# Patient Record
Sex: Female | Born: 1960 | Race: Black or African American | Hispanic: No | Marital: Married | Smoking: Never smoker
Health system: Southern US, Community
[De-identification: ages and names within clinical notes are randomized; demographics above are authoritative.]

## PROBLEM LIST (undated history)

## (undated) DIAGNOSIS — I10 Essential (primary) hypertension: Secondary | ICD-10-CM

## (undated) DIAGNOSIS — J324 Chronic pansinusitis: Secondary | ICD-10-CM

## (undated) DIAGNOSIS — M810 Age-related osteoporosis without current pathological fracture: Secondary | ICD-10-CM

## (undated) DIAGNOSIS — G4700 Insomnia, unspecified: Secondary | ICD-10-CM

## (undated) DIAGNOSIS — R350 Frequency of micturition: Secondary | ICD-10-CM

## (undated) DIAGNOSIS — Z8711 Personal history of peptic ulcer disease: Secondary | ICD-10-CM

## (undated) DIAGNOSIS — F439 Reaction to severe stress, unspecified: Secondary | ICD-10-CM

## (undated) DIAGNOSIS — K219 Gastro-esophageal reflux disease without esophagitis: Secondary | ICD-10-CM

## (undated) DIAGNOSIS — R109 Unspecified abdominal pain: Secondary | ICD-10-CM

## (undated) DIAGNOSIS — F40240 Claustrophobia: Secondary | ICD-10-CM

## (undated) DIAGNOSIS — J302 Other seasonal allergic rhinitis: Secondary | ICD-10-CM

## (undated) DIAGNOSIS — R5383 Other fatigue: Secondary | ICD-10-CM

## (undated) DIAGNOSIS — R140 Abdominal distension (gaseous): Secondary | ICD-10-CM

## (undated) DIAGNOSIS — M1990 Unspecified osteoarthritis, unspecified site: Secondary | ICD-10-CM

## (undated) DIAGNOSIS — G8929 Other chronic pain: Secondary | ICD-10-CM

## (undated) DIAGNOSIS — R1011 Right upper quadrant pain: Secondary | ICD-10-CM

## (undated) DIAGNOSIS — R630 Anorexia: Secondary | ICD-10-CM

## (undated) HISTORY — DX: Other seasonal allergic rhinitis: J30.2

---

## 1999-07-18 ENCOUNTER — Other Ambulatory Visit: Admission: RE | Admit: 1999-07-18 | Discharge: 1999-07-18 | Payer: Self-pay | Admitting: Obstetrics and Gynecology

## 1999-11-05 ENCOUNTER — Other Ambulatory Visit: Admission: RE | Admit: 1999-11-05 | Discharge: 1999-11-05 | Payer: Self-pay | Admitting: Obstetrics and Gynecology

## 1999-12-03 ENCOUNTER — Other Ambulatory Visit: Admission: RE | Admit: 1999-12-03 | Discharge: 1999-12-03 | Payer: Self-pay | Admitting: Obstetrics and Gynecology

## 2000-01-21 ENCOUNTER — Encounter (INDEPENDENT_AMBULATORY_CARE_PROVIDER_SITE_OTHER): Payer: Self-pay | Admitting: Specialist

## 2000-01-21 ENCOUNTER — Other Ambulatory Visit: Admission: RE | Admit: 2000-01-21 | Discharge: 2000-01-21 | Payer: Self-pay | Admitting: Obstetrics and Gynecology

## 2000-07-30 ENCOUNTER — Other Ambulatory Visit: Admission: RE | Admit: 2000-07-30 | Discharge: 2000-07-30 | Payer: Self-pay | Admitting: Obstetrics and Gynecology

## 2000-08-27 ENCOUNTER — Other Ambulatory Visit: Admission: RE | Admit: 2000-08-27 | Discharge: 2000-08-27 | Payer: Self-pay | Admitting: Obstetrics and Gynecology

## 2000-12-22 ENCOUNTER — Inpatient Hospital Stay (HOSPITAL_COMMUNITY): Admission: AD | Admit: 2000-12-22 | Discharge: 2000-12-22 | Payer: Self-pay | Admitting: Obstetrics & Gynecology

## 2001-01-07 ENCOUNTER — Other Ambulatory Visit: Admission: RE | Admit: 2001-01-07 | Discharge: 2001-01-07 | Payer: Self-pay | Admitting: Obstetrics and Gynecology

## 2001-08-01 ENCOUNTER — Other Ambulatory Visit: Admission: RE | Admit: 2001-08-01 | Discharge: 2001-08-01 | Payer: Self-pay | Admitting: Obstetrics and Gynecology

## 2002-03-06 ENCOUNTER — Emergency Department (HOSPITAL_COMMUNITY): Admission: EM | Admit: 2002-03-06 | Discharge: 2002-03-06 | Payer: Self-pay | Admitting: Emergency Medicine

## 2002-10-10 ENCOUNTER — Other Ambulatory Visit: Admission: RE | Admit: 2002-10-10 | Discharge: 2002-10-10 | Payer: Self-pay | Admitting: Obstetrics and Gynecology

## 2003-11-02 ENCOUNTER — Other Ambulatory Visit: Admission: RE | Admit: 2003-11-02 | Discharge: 2003-11-02 | Payer: Self-pay | Admitting: Obstetrics and Gynecology

## 2004-12-05 ENCOUNTER — Other Ambulatory Visit: Admission: RE | Admit: 2004-12-05 | Discharge: 2004-12-05 | Payer: Self-pay | Admitting: Obstetrics and Gynecology

## 2005-06-29 HISTORY — PX: ENDOMETRIAL ABLATION: SHX621

## 2006-04-28 ENCOUNTER — Encounter: Admission: RE | Admit: 2006-04-28 | Discharge: 2006-04-28 | Payer: Self-pay | Admitting: Obstetrics and Gynecology

## 2006-05-18 ENCOUNTER — Encounter: Admission: RE | Admit: 2006-05-18 | Discharge: 2006-05-18 | Payer: Self-pay | Admitting: Obstetrics and Gynecology

## 2006-06-02 ENCOUNTER — Encounter: Admission: RE | Admit: 2006-06-02 | Discharge: 2006-06-02 | Payer: Self-pay | Admitting: Obstetrics and Gynecology

## 2006-08-31 HISTORY — PX: TUBAL LIGATION: SHX77

## 2006-12-13 ENCOUNTER — Ambulatory Visit (HOSPITAL_COMMUNITY): Admission: RE | Admit: 2006-12-13 | Discharge: 2006-12-13 | Payer: Self-pay | Admitting: Obstetrics and Gynecology

## 2009-10-02 ENCOUNTER — Emergency Department: Payer: Self-pay | Admitting: Emergency Medicine

## 2010-07-19 ENCOUNTER — Encounter: Payer: Self-pay | Admitting: Obstetrics and Gynecology

## 2010-11-18 ENCOUNTER — Other Ambulatory Visit (HOSPITAL_BASED_OUTPATIENT_CLINIC_OR_DEPARTMENT_OTHER): Payer: BC Managed Care – PPO

## 2010-11-18 ENCOUNTER — Encounter (HOSPITAL_BASED_OUTPATIENT_CLINIC_OR_DEPARTMENT_OTHER)
Admission: RE | Admit: 2010-11-18 | Discharge: 2010-11-18 | Disposition: A | Discharge status: Home / Self Care | Payer: BC Managed Care – PPO | Source: Ambulatory Visit | Attending: Orthopedic Surgery | Admitting: Orthopedic Surgery

## 2010-11-18 LAB — BASIC METABOLIC PANEL
BUN: 8 mg/dL (ref 6–23)
CO2: 28 mEq/L (ref 19–32)
Glucose, Bld: 94 mg/dL (ref 70–99)
Potassium: 3.1 mEq/L — ABNORMAL LOW (ref 3.5–5.1)
Sodium: 136 mEq/L (ref 135–145)

## 2010-11-20 ENCOUNTER — Ambulatory Visit (HOSPITAL_BASED_OUTPATIENT_CLINIC_OR_DEPARTMENT_OTHER)
Admission: RE | Admit: 2010-11-20 | Discharge: 2010-11-21 | Disposition: A | Discharge status: Home / Self Care | Payer: BC Managed Care – PPO | Source: Ambulatory Visit | Attending: Orthopedic Surgery | Admitting: Orthopedic Surgery

## 2010-11-20 DIAGNOSIS — M719 Bursopathy, unspecified: Secondary | ICD-10-CM | POA: Insufficient documentation

## 2010-11-20 DIAGNOSIS — M949 Disorder of cartilage, unspecified: Secondary | ICD-10-CM | POA: Insufficient documentation

## 2010-11-20 DIAGNOSIS — Z01812 Encounter for preprocedural laboratory examination: Secondary | ICD-10-CM | POA: Insufficient documentation

## 2010-11-20 DIAGNOSIS — M24119 Other articular cartilage disorders, unspecified shoulder: Secondary | ICD-10-CM | POA: Insufficient documentation

## 2010-11-20 DIAGNOSIS — M25819 Other specified joint disorders, unspecified shoulder: Secondary | ICD-10-CM | POA: Insufficient documentation

## 2010-11-20 DIAGNOSIS — M899 Disorder of bone, unspecified: Secondary | ICD-10-CM | POA: Insufficient documentation

## 2010-11-20 DIAGNOSIS — M66329 Spontaneous rupture of flexor tendons, unspecified upper arm: Secondary | ICD-10-CM | POA: Insufficient documentation

## 2010-11-20 DIAGNOSIS — M67919 Unspecified disorder of synovium and tendon, unspecified shoulder: Secondary | ICD-10-CM | POA: Insufficient documentation

## 2010-11-23 NOTE — Op Note (Signed)
  NAMEBRINLYNN, Chelsea Wheeler              ACCOUNT NO.:  [id number]  MEDICAL RECORD NO.:  [id number]          PATIENT TYPE:  LOCATION:                                 FACILITY:  PHYSICIAN:  Loreta Ave, M.D. DATE OF BIRTH:  02-22-1961  DATE OF PROCEDURE:  11/20/2010 DATE OF DISCHARGE:                              OPERATIVE REPORT   PREOPERATIVE DIAGNOSES:  Right shoulder rotator cuff tear and impingement, distal clavicle osteolysis.  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  Degenerative tearing of labrum.  Partial tearing of long head biceps tendon.  PROCEDURE:  Right shoulder exam under anesthesia, arthroscopy, debridement of labrum, biceps.  Bursectomy, acromioplasty, CA ligament release.  Excision distal clavicle.  Arthroscopic-assisted rotator cuff repair.  Fiber weave suture x2, swivel lock anchors x2.SURGEON:  Loreta Ave, MD  ASSISTANT:  Genene Churn. Barry Dienes, PA present throughout the entire case, necessary for timely completion of procedure.  ANESTHESIA:  General.  BLOOD LOSS:  Minimal.  SPECIMEN:  None.  CULTURES:  None.  COMPLICATIONS:  None.  DRESSINGS:  Soft compressive with shoulder immobilizer  PROCEDURE:  The patient was brought to the operating room and placed on the operating table in supine position.  After adequate anesthesia had been obtained, shoulders examined.  Full motion stable shoulder. Prepped and draped in usual sterile fashion.  Three portals anterior, posterior, and lateral.  Arthroscope was introduced.  Shoulder was distended and inspected.  Full-thickness tear supraspinatus tendon with marked intratendinous tearing.  Retracted a centimeter, but very mobile and reparable.  Biceps had some partial tearing as it exited, the shoulder debrided, not enough to warrant release.  Tearing of the labrum from back debrided.  Some focal  grade 3 changes in the glenoid, but rest of the glenohumeral joint looked reasonably good.  From above type 3 acromion, bursa resected.  Acromioplasty to a type 1 acromion releasing CA ligament.  The cannula placed laterally.  The cuff was mobilized, captured with two horizontal mattress sutures that were firmly anchored down into the roughened tuberosity with two swivel lock anchors. Yielding a nice firm watertight closure of the cuff.  Distal clavicle exposed.  Lateral centimeter of the clavicle resected.  Adequacy of decompression, lateral excision of the clavicle and cuff repair was confirmed viewing from all portals.  Instruments and fluid removed.  All portals closed with nylon.  Sterile compressive dressing applied. Shoulder immobilizer applied.  Anesthesia reversed.  Brought to the recovery room.  Tolerated surgery well.  No complications.     Loreta Ave, M.D.     DFM/MEDQ  D:  11/20/2010  T:  11/21/2010  Job:  [id number]  cc:   Dr. Dario Ave  Electronically Signed by Mckinley Jewel M.D. on 11/23/2010 11:38:16 AM

## 2010-12-03 ENCOUNTER — Ambulatory Visit
Payer: BC Managed Care – PPO | Attending: Orthopedic Surgery | Admitting: Rehabilitative and Restorative Service Providers"

## 2010-12-03 DIAGNOSIS — M6281 Muscle weakness (generalized): Secondary | ICD-10-CM | POA: Insufficient documentation

## 2010-12-03 DIAGNOSIS — M25619 Stiffness of unspecified shoulder, not elsewhere classified: Secondary | ICD-10-CM | POA: Insufficient documentation

## 2010-12-03 DIAGNOSIS — IMO0001 Reserved for inherently not codable concepts without codable children: Secondary | ICD-10-CM | POA: Insufficient documentation

## 2010-12-08 ENCOUNTER — Encounter: Payer: BC Managed Care – PPO | Admitting: Physical Therapy

## 2010-12-09 ENCOUNTER — Ambulatory Visit: Payer: BC Managed Care – PPO | Admitting: Rehabilitative and Restorative Service Providers"

## 2010-12-11 ENCOUNTER — Ambulatory Visit: Payer: BC Managed Care – PPO | Admitting: Physical Therapy

## 2010-12-16 ENCOUNTER — Ambulatory Visit: Payer: BC Managed Care – PPO | Admitting: Rehabilitative and Restorative Service Providers"

## 2010-12-18 ENCOUNTER — Ambulatory Visit: Payer: BC Managed Care – PPO | Admitting: Rehabilitative and Restorative Service Providers"

## 2010-12-23 ENCOUNTER — Ambulatory Visit: Payer: BC Managed Care – PPO | Admitting: Physical Therapy

## 2010-12-25 ENCOUNTER — Ambulatory Visit: Payer: BC Managed Care – PPO | Admitting: Physical Therapy

## 2010-12-30 ENCOUNTER — Ambulatory Visit
Payer: BC Managed Care – PPO | Attending: Orthopedic Surgery | Admitting: Rehabilitative and Restorative Service Providers"

## 2010-12-30 DIAGNOSIS — M6281 Muscle weakness (generalized): Secondary | ICD-10-CM | POA: Insufficient documentation

## 2010-12-30 DIAGNOSIS — M25619 Stiffness of unspecified shoulder, not elsewhere classified: Secondary | ICD-10-CM | POA: Insufficient documentation

## 2010-12-30 DIAGNOSIS — IMO0001 Reserved for inherently not codable concepts without codable children: Secondary | ICD-10-CM | POA: Insufficient documentation

## 2011-01-01 ENCOUNTER — Ambulatory Visit: Payer: BC Managed Care – PPO | Admitting: Rehabilitative and Restorative Service Providers"

## 2011-01-06 ENCOUNTER — Ambulatory Visit: Payer: BC Managed Care – PPO | Admitting: Physical Therapy

## 2011-01-08 ENCOUNTER — Ambulatory Visit: Payer: BC Managed Care – PPO | Admitting: Physical Therapy

## 2011-01-12 ENCOUNTER — Ambulatory Visit: Payer: BC Managed Care – PPO | Admitting: Physical Therapy

## 2011-01-14 ENCOUNTER — Ambulatory Visit: Payer: BC Managed Care – PPO | Admitting: Physical Therapy

## 2011-01-19 ENCOUNTER — Ambulatory Visit: Payer: BC Managed Care – PPO | Admitting: Rehabilitative and Restorative Service Providers"

## 2011-01-21 ENCOUNTER — Ambulatory Visit: Payer: BC Managed Care – PPO | Admitting: Rehabilitative and Restorative Service Providers"

## 2011-01-26 ENCOUNTER — Ambulatory Visit: Payer: BC Managed Care – PPO | Admitting: Rehabilitative and Restorative Service Providers"

## 2011-01-28 ENCOUNTER — Ambulatory Visit
Payer: BC Managed Care – PPO | Attending: Orthopedic Surgery | Admitting: Rehabilitative and Restorative Service Providers"

## 2011-01-28 DIAGNOSIS — M25619 Stiffness of unspecified shoulder, not elsewhere classified: Secondary | ICD-10-CM | POA: Insufficient documentation

## 2011-01-28 DIAGNOSIS — IMO0001 Reserved for inherently not codable concepts without codable children: Secondary | ICD-10-CM | POA: Insufficient documentation

## 2011-01-28 DIAGNOSIS — M6281 Muscle weakness (generalized): Secondary | ICD-10-CM | POA: Insufficient documentation

## 2011-02-02 ENCOUNTER — Ambulatory Visit: Payer: BC Managed Care – PPO | Admitting: Physical Therapy

## 2011-02-04 ENCOUNTER — Ambulatory Visit: Payer: BC Managed Care – PPO | Admitting: Physical Therapy

## 2011-02-10 ENCOUNTER — Ambulatory Visit: Payer: BC Managed Care – PPO | Admitting: Physical Therapy

## 2011-02-12 ENCOUNTER — Ambulatory Visit: Payer: BC Managed Care – PPO | Admitting: Physical Therapy

## 2011-02-16 ENCOUNTER — Encounter: Payer: BC Managed Care – PPO | Admitting: Rehabilitative and Restorative Service Providers"

## 2011-02-17 ENCOUNTER — Ambulatory Visit: Payer: BC Managed Care – PPO | Admitting: Rehabilitative and Restorative Service Providers"

## 2011-09-01 ENCOUNTER — Other Ambulatory Visit: Payer: Self-pay | Admitting: Gastroenterology

## 2011-09-11 ENCOUNTER — Ambulatory Visit
Admission: RE | Admit: 2011-09-11 | Discharge: 2011-09-11 | Disposition: A | Discharge status: Home / Self Care | Payer: BC Managed Care – PPO | Source: Ambulatory Visit | Attending: Gastroenterology | Admitting: Gastroenterology

## 2011-09-11 MED ORDER — IOHEXOL 300 MG/ML  SOLN
100.0000 mL | Freq: Once | INTRAMUSCULAR | Status: AC | PRN
  Administered 2011-09-11: 100 mL via INTRAVENOUS

## 2011-09-15 ENCOUNTER — Other Ambulatory Visit: Payer: Self-pay | Admitting: Gastroenterology

## 2011-09-15 DIAGNOSIS — K769 Liver disease, unspecified: Secondary | ICD-10-CM

## 2011-09-19 ENCOUNTER — Ambulatory Visit
Admission: RE | Admit: 2011-09-19 | Discharge: 2011-09-19 | Disposition: A | Discharge status: Home / Self Care | Payer: No Typology Code available for payment source | Source: Ambulatory Visit | Attending: Gastroenterology | Admitting: Gastroenterology

## 2011-09-19 DIAGNOSIS — K769 Liver disease, unspecified: Secondary | ICD-10-CM

## 2011-09-19 MED ORDER — GADOXETATE DISODIUM 0.25 MMOL/ML IV SOLN
8.0000 mL | Freq: Once | INTRAVENOUS | Status: AC | PRN
  Administered 2011-09-19: 8 mL via INTRAVENOUS

## 2012-09-14 ENCOUNTER — Ambulatory Visit (INDEPENDENT_AMBULATORY_CARE_PROVIDER_SITE_OTHER): Payer: BC Managed Care – PPO | Admitting: Internal Medicine

## 2012-09-14 ENCOUNTER — Ambulatory Visit (INDEPENDENT_AMBULATORY_CARE_PROVIDER_SITE_OTHER)
Admission: RE | Admit: 2012-09-14 | Discharge: 2012-09-14 | Disposition: A | Discharge status: Home / Self Care | Payer: BC Managed Care – PPO | Source: Ambulatory Visit | Attending: Internal Medicine | Admitting: Internal Medicine

## 2012-09-14 ENCOUNTER — Encounter: Payer: Self-pay | Admitting: Internal Medicine

## 2012-09-14 VITALS — BP 110/82 | HR 70 | Ht 68.0 in | Wt 194.0 lb

## 2012-09-14 DIAGNOSIS — R059 Cough, unspecified: Secondary | ICD-10-CM

## 2012-09-14 DIAGNOSIS — R05 Cough: Secondary | ICD-10-CM

## 2012-09-14 MED ORDER — PREDNISONE (PAK) 10 MG PO TABS: ORAL_TABLET | ORAL | Status: DC

## 2012-09-14 MED ORDER — TRAMADOL HCL 50 MG PO TABS: ORAL_TABLET | ORAL | Status: DC

## 2012-09-14 MED ORDER — FAMOTIDINE 20 MG PO TABS: ORAL_TABLET | ORAL | Status: DC

## 2012-09-14 NOTE — Patient Instructions (Signed)
The key to effective treatment for your cough is eliminating the non-stop cycle of cough you're stuck in long enough to let your airway heal completely and then see if there is anything still making you cough once you stop the cough suppression, but this should take no more than 5 days to figuure out  First take delsym two tsp every 12 hours and supplement if needed with  tramadol 50 mg up to 2 every 4 hours to suppress the urge to cough. Swallowing water or using ice chips/non mint and menthol containing candies (such as lifesavers or sugarless jolly ranchers) are also effective.  You should rest your voice and avoid activities that you know make you cough.  Once you have eliminated the cough for 3 straight days try reducing the tramadol first,  then the delsym as tolerated.    Try nexium Take 30-60 min before first meal of the day and Pepcid 20 mg one bedtime until cough is completely gone for at least a week without the need for cough suppression  I think of reflux for chronic cough like I do oxygen for fire (doesn't cause the fire but once you get the oxygen suppressed it usually goes away regardless of the exact cause).  GERD (REFLUX)  is an extremely common cause of respiratory symptoms, many times with no significant heartburn at all.    It can be treated with medication, but also with lifestyle changes including avoidance of late meals, excessive alcohol, smoking cessation, and avoid fatty foods, chocolate, peppermint, colas, red wine, and acidic juices such as orange juice.  NO MINT OR MENTHOL PRODUCTS SO NO COUGH DROPS  USE SUGARLESS CANDY INSTEAD (jolley ranchers or Stover's)  NO OIL BASED VITAMINS - use powdered substitutes.    Prednisone 10 mg take  4 each am x 2 days,   2 each am x 2 days,  1 each am x2days and stop

## 2012-09-14 NOTE — Progress Notes (Signed)
  Subjective:    Patient ID: Chelsea Wheeler, female    DOB: [birth year], 52 y.o.   MRN: [id number]  HPI  61 yobf never smoker with onset ? Allergies in her 30's seasonal rhininitis rx with nasonex and zyrtec intermittently but never cough until late 2013 self referred 09/14/2012 to pulmonary clinic for cough   09/14/2012 1st pulmonary ov/ cc new pattern of acute coughing starting mid dec 2013  assoc with sneeze, itching, nasal congestion treated with abx, zpak, symbicort and proaire, tessilon pearls and prednisone and nexium 40 mg prn > never better, even transiently  Cough occurs day and night but  worse 2 am wakes up with it. Coughs so hard gags/vomits and also cough with eating, mucus is clear.  No obvious daytime variabilty or assoc chronic cough or cp or chest tightness, subjective wheeze overt sinus or hb symptoms. No unusual exp hx    Also denies any obvious fluctuation of symptoms with weather or environmental changes or other aggravating or alleviating factors except as outlined above     Review of Systems  Constitutional: Negative for fever, chills and unexpected weight change.  HENT: Positive for sneezing. Negative for ear pain, nosebleeds, congestion, sore throat, rhinorrhea, trouble swallowing, dental problem, voice change, postnasal drip and sinus pressure.   Eyes: Negative for visual disturbance.  Respiratory: Positive for cough and choking. Negative for shortness of breath.   Cardiovascular: Negative for chest pain and leg swelling.  Gastrointestinal: Negative for vomiting, abdominal pain and diarrhea.  Genitourinary: Negative for difficulty urinating.  Musculoskeletal: Negative for arthralgias.  Skin: Negative for rash.  Neurological: Positive for headaches. Negative for tremors and syncope.  Hematological: Does not bruise/bleed easily.       Objective:   Physical Exam  amb bf nad HEENT: nl dentition, turbinates, and orophanx. Nl external ear canals without cough  reflex   NECK :  without JVD/Nodes/TM/ nl carotid upstrokes bilaterally   LUNGS: no acc muscle use, clear to A and P bilaterally without cough on insp or exp maneuvers   CV:  RRR  no s3 or murmur or increase in P2, no edema   ABD:  soft and nontender with nl excursion in the supine position. No bruits or organomegaly, bowel sounds nl  MS:  warm without deformities, calf tenderness, cyanosis or clubbing  SKIN: warm and dry without lesions    NEURO:  alert, approp, no deficits    CXR  09/14/2012 :  Minimal peribronchial thickening of uncertain chronicity.          Assessment & Plan:

## 2012-09-14 NOTE — Assessment & Plan Note (Signed)
The most common causes of chronic cough in immunocompetent adults include the following: upper airway cough syndrome (UACS), previously referred to as postnasal drip syndrome (PNDS), which is caused by variety of rhinosinus conditions; (2) asthma; (3) GERD; (4) chronic bronchitis from cigarette smoking or other inhaled environmental irritants; (5) nonasthmatic eosinophilic bronchitis; and (6) bronchiectasis.   These conditions, singly or in combination, have accounted for up to 94% of the causes of chronic cough in prospective studies.   Other conditions have constituted no >6% of the causes in prospective studies These have included bronchogenic carcinoma, chronic interstitial pneumonia, sarcoidosis, left ventricular failure, ACEI-induced cough, and aspiration from a condition associated with pharyngeal dysfunction.   .Chronic cough is often simultaneously caused by more than one condition. A single cause has been found from 38 to 82% of the time, multiple causes from 18 to 62%. Multiply caused cough has been the result of three diseases up to 42% of the time.    Of the three most common causes of chronic cough, only one (GERD)  can actually cause the other two (asthma and post nasal drip syndrome)  and perpetuate the cylce of cough inducing airway trauma, inflammation, heightened sensitivity to reflux which is prompted by the cough itself via a cyclical mechanism.    This may partially respond to steroids and look like asthma and post nasal drainage but never erradicated completely unless the cough and the secondary reflux are eliminated, preferably both at the same time.  While not intuitively obvious, many patients with chronic low grade reflux do not cough until there is a secondary insult that disturbs the protective epithelial barrier and exposes sensitive nerve endings.  This can be viral or direct physical injury such as with an endotracheal tube.   The point is that once this occurs, it is  difficult to eliminate using anything but a maximally effective acid suppression regimen at least in the short run, accompanied by an appropriate diet to address non acid GERD.    See instructions for specific recommendations which were reviewed directly with the patient who was given a copy with highlighter outlining the key components.  

## 2012-09-15 ENCOUNTER — Encounter: Payer: Self-pay | Admitting: Internal Medicine

## 2012-09-15 NOTE — Progress Notes (Signed)
Quick Note:  Spoke with pt and notified of results per Dr. Wert. Pt verbalized understanding and denied any questions.  ______ 

## 2012-09-26 ENCOUNTER — Telehealth: Payer: Self-pay | Admitting: Internal Medicine

## 2012-09-26 MED ORDER — TRAMADOL HCL 50 MG PO TABS: ORAL_TABLET | ORAL | Status: DC

## 2012-09-26 NOTE — Telephone Encounter (Signed)
Pt is aware RX was sent. Nothing further was needed

## 2012-09-26 NOTE — Telephone Encounter (Signed)
Ok x one then ov 

## 2012-09-26 NOTE — Telephone Encounter (Signed)
Last OV 09/14/12, rx for tramadol was sent for #40 on that date. LMTCB to review how pt cough is doing. Carron Curie, CMA

## 2012-09-26 NOTE — Telephone Encounter (Signed)
Pt states that cough is some what better comes an goes,not sure what triggers cough.  Pt is also using delsym which seems to help along with tramadol. Requesting another  Round of the tramadol to see if it will help her kick the cough.  Allergies  Allergen Reactions  . Lodine (Etodolac) Rash  . Naproxen Rash  . Penicillins Rash   Dr Sherene Sires Please advise  Thank you

## 2012-09-30 ENCOUNTER — Ambulatory Visit: Payer: BC Managed Care – PPO | Admitting: Internal Medicine

## 2012-10-21 ENCOUNTER — Ambulatory Visit: Payer: BC Managed Care – PPO | Admitting: Internal Medicine

## 2013-01-09 ENCOUNTER — Emergency Department: Payer: Self-pay | Admitting: Emergency Medicine

## 2013-01-13 ENCOUNTER — Other Ambulatory Visit: Payer: Self-pay | Admitting: Unknown Physician Specialty

## 2014-02-15 ENCOUNTER — Emergency Department (HOSPITAL_COMMUNITY)
Admission: EM | Admit: 2014-02-15 | Discharge: 2014-02-15 | Disposition: A | Discharge status: Home / Self Care | Payer: BC Managed Care – PPO | Attending: Emergency Medicine | Admitting: Emergency Medicine

## 2014-02-15 ENCOUNTER — Emergency Department (HOSPITAL_COMMUNITY): Payer: BC Managed Care – PPO

## 2014-02-15 ENCOUNTER — Encounter (HOSPITAL_COMMUNITY): Payer: Self-pay | Admitting: Emergency Medicine

## 2014-02-15 DIAGNOSIS — R079 Chest pain, unspecified: Secondary | ICD-10-CM | POA: Insufficient documentation

## 2014-02-15 DIAGNOSIS — M542 Cervicalgia: Secondary | ICD-10-CM | POA: Insufficient documentation

## 2014-02-15 DIAGNOSIS — I10 Essential (primary) hypertension: Secondary | ICD-10-CM | POA: Insufficient documentation

## 2014-02-15 DIAGNOSIS — Z79899 Other long term (current) drug therapy: Secondary | ICD-10-CM | POA: Insufficient documentation

## 2014-02-15 DIAGNOSIS — Z880 Allergy status to penicillin: Secondary | ICD-10-CM | POA: Diagnosis not present

## 2014-02-15 DIAGNOSIS — Z8709 Personal history of other diseases of the respiratory system: Secondary | ICD-10-CM | POA: Insufficient documentation

## 2014-02-15 HISTORY — DX: Essential (primary) hypertension: I10

## 2014-02-15 LAB — COMPREHENSIVE METABOLIC PANEL
ALT: 11 U/L (ref 0–35)
ANION GAP: 12 (ref 5–15)
AST: 18 U/L (ref 0–37)
Albumin: 4 g/dL (ref 3.5–5.2)
Alkaline Phosphatase: 64 U/L (ref 39–117)
BUN: 8 mg/dL (ref 6–23)
CO2: 28 meq/L (ref 19–32)
CREATININE: 0.88 mg/dL (ref 0.50–1.10)
Calcium: 9.9 mg/dL (ref 8.4–10.5)
Chloride: 100 mEq/L (ref 96–112)
GFR, EST AFRICAN AMERICAN: 87 mL/min — AB (ref >90)
GFR, EST NON AFRICAN AMERICAN: 75 mL/min — AB (ref >90)
GLUCOSE: 102 mg/dL — AB (ref 70–99)
Potassium: 3.6 mEq/L — ABNORMAL LOW (ref 3.7–5.3)
Sodium: 140 mEq/L (ref 137–147)
TOTAL PROTEIN: 7.9 g/dL (ref 6.0–8.3)
Total Bilirubin: 0.5 mg/dL (ref 0.3–1.2)

## 2014-02-15 LAB — PRO B NATRIURETIC PEPTIDE: PRO B NATRI PEPTIDE: 12.1 pg/mL (ref 0–125)

## 2014-02-15 LAB — TROPONIN I

## 2014-02-15 LAB — MAGNESIUM: MAGNESIUM: 2.1 mg/dL (ref 1.5–2.5)

## 2014-02-15 LAB — CBC
HCT: 39.4 % (ref 36.0–46.0)
HEMOGLOBIN: 13.3 g/dL (ref 12.0–15.0)
MCH: 29.6 pg (ref 26.0–34.0)
MCHC: 33.8 g/dL (ref 30.0–36.0)
MCV: 87.6 fL (ref 78.0–100.0)
Platelets: 159 10*3/uL (ref 150–400)
RBC: 4.5 MIL/uL (ref 3.87–5.11)
RDW: 12.5 % (ref 11.5–15.5)
WBC: 6.9 10*3/uL (ref 4.0–10.5)

## 2014-02-15 MED ORDER — DIAZEPAM 5 MG PO TABS: 5.0000 mg | ORAL_TABLET | Freq: Two times a day (BID) | ORAL | Status: DC

## 2014-02-15 MED ORDER — DIAZEPAM 5 MG PO TABS
5.0000 mg | ORAL_TABLET | Freq: Once | ORAL | Status: AC
  Administered 2014-02-15: 5 mg via ORAL
  Filled 2014-02-15: qty 1

## 2014-02-15 MED ORDER — HYDROCODONE-ACETAMINOPHEN 5-325 MG PO TABS
1.0000 | ORAL_TABLET | Freq: Once | ORAL | Status: AC
  Administered 2014-02-15: 1 via ORAL
  Filled 2014-02-15: qty 1

## 2014-02-15 MED ORDER — HYDROCODONE-ACETAMINOPHEN 5-325 MG PO TABS: 1.0000 | ORAL_TABLET | Freq: Four times a day (QID) | ORAL | Status: DC | PRN

## 2014-02-15 NOTE — Discharge Instructions (Signed)
As discussed, your evaluation today has been largely reassuring.  But, it is important that you monitor your condition carefully, and do not hesitate to return to the ED if you develop new, or concerning changes in your condition.  Otherwise, please follow-up with your physician for appropriate ongoing care.  Your pain is likely to 2 inflammation of the neck and the adjacent nerves. In addition to that they provided medication, please use ibuprofen, 600 mg, 3 times daily for 3 days. Please also use ice packs, 4 times daily for the next 3 days.  \

## 2014-02-15 NOTE — ED Provider Notes (Signed)
CSN: [id number]     Arrival date & time 02/15/14  1057 History   First MD Initiated Contact with Patient 02/15/14 1122     Chief Complaint  Patient presents with  . Shoulder Pain  . Numbness  . Chest Pain     (Consider location/radiation/quality/duration/timing/severity/associated sxs/prior Treatment) HPI Patient presents with bilateral posterior shoulder and neck pain.  Symptoms improved in approximately one week ago. Patient is doing substantial amounts of home and cleaning activity and chores during the onset of symptoms. Since onset symptoms have been waxing, waning, but I was present and some degree. Over the past day the pain has become more severe. Patient has not taken any medication for pain relief in approximately one week. Currently the pain also radiates towards the left arm.  On review of systems questioning the patient also notes mild occasional chest pressure. She currently denies dyspnea, lightheadedness, syncope, nausea, vomiting fever, chills.  Past Medical History  Diagnosis Date  . Seasonal allergies   . Hypertension    Past Surgical History  Procedure Laterality Date  . Tubal ligation  08-31-06   Family History  Problem Relation Age of Onset  . Allergies Sister    History  Substance Use Topics  . Smoking status: Never Smoker   . Smokeless tobacco: Never Used  . Alcohol Use: No   OB History   Grav Para Term Preterm Abortions TAB SAB Ect Mult Living                 Review of Systems  Constitutional:       Per HPI, otherwise negative  HENT:       Per HPI, otherwise negative  Respiratory:       Per HPI, otherwise negative  Cardiovascular:       Per HPI, otherwise negative  Gastrointestinal: Negative for vomiting.  Endocrine:       Negative aside from HPI  Genitourinary:       Neg aside from HPI   Musculoskeletal:       Per HPI, otherwise negative  Skin: Negative.   Neurological: Negative for syncope.      Allergies  Lodine; Naproxen;  and Penicillins  Home Medications   Prior to Admission medications   Medication Sig Start Date End Date Taking? Authorizing Provider  cetirizine (ZYRTEC) 10 MG tablet Take 10 mg by mouth daily as needed for allergies.   Yes Historical Provider, MD  esomeprazole (NEXIUM) 40 MG capsule Take 40 mg by mouth every other day.   Yes Historical Provider, MD  hydrochlorothiazide (HYDRODIURIL) 25 MG tablet Take 25 mg by mouth every evening.   Yes Historical Provider, MD  Multiple Vitamins-Minerals (ANTIOXIDANT FORMULA SG) capsule Take 1 capsule by mouth daily.   Yes Historical Provider, MD  PRESCRIPTION MEDICATION once a week. ALLERGY SHOT   Yes Historical Provider, MD  zolpidem (AMBIEN) 10 MG tablet Take 10 mg by mouth at bedtime as needed for sleep.   Yes Historical Provider, MD   BP 126/81  Pulse 66  Temp(Src) 99.3 F (37.4 C) (Oral)  Resp 16  SpO2 99% Physical Exam  Nursing note and vitals reviewed. Constitutional: She is oriented to person, place, and time. She appears well-developed and well-nourished. No distress.  HENT:  Head: Normocephalic and atraumatic.  Eyes: Conjunctivae and EOM are normal.  Neck:  Neck is supple with appropriate range of motion, though there is tenderness to palpation about the central posterior area, positive Spurling test  Cardiovascular: Normal rate and  regular rhythm.   Pulmonary/Chest: Effort normal and breath sounds normal. No stridor. No respiratory distress.  Abdominal: She exhibits no distension.  Musculoskeletal: She exhibits no edema.  Neurological: She is alert and oriented to person, place, and time. She displays no atrophy and no tremor. No cranial nerve deficit or sensory deficit. She exhibits normal muscle tone. She displays no seizure activity. Coordination normal.  Skin: Skin is warm and dry.  Psychiatric: She has a normal mood and affect.    ED Course  Procedures (including critical care time) Labs Review Labs Reviewed  COMPREHENSIVE  METABOLIC PANEL - Abnormal; Notable for the following:    Potassium 3.6 (*)    Glucose, Bld 102 (*)    GFR calc non Af Amer 75 (*)    GFR calc Af Amer 87 (*)    All other components within normal limits  CBC  PRO B NATRIURETIC PEPTIDE  MAGNESIUM  TROPONIN I    Imaging Review Dg Chest 2 View  02/15/2014   CLINICAL DATA:  Chest pain.  EXAM: CHEST  2 VIEW  COMPARISON:  September 14, 2012.  FINDINGS: The heart size and mediastinal contours are within normal limits. Both lungs are clear. No pneumothorax or pleural effusion is noted. The visualized skeletal structures are unremarkable.  IMPRESSION: No acute cardiopulmonary abnormality seen.   Electronically Signed   By: Roque LiasJames  Green M.D.   On: 02/15/2014 12:17     EKG Interpretation   Date/Time:  Thursday February 15 2014 11:41:15 EDT Ventricular Rate:  63 PR Interval:  158 QRS Duration: 85 QT Interval:  409 QTC Calculation: 419 R Axis:   65 Text Interpretation:  Sinus rhythm Sinus rhythm Artifact Abnormal ekg  Confirmed by Gerhard MunchLOCKWOOD, Briggitte Boline  MD ([id number]) on 02/15/2014 11:55:45 AM     2:30 PM On exam the patient is in no distress, states that she feels better.   MDM   Patient presents with new posterior superior pain, as well as possible radiation to his left arm. Patient has no findings consistent with coronary ischemia, nor with pulmonary infection Patient is distally neurovascularly intact, and with her improvement following analgesia, cryotherapy to the back, there suspicion for radicular pain. Patient was started on a course analgesics, discharged in stable condition to follow up with primary care and/or orthopedics as needed.     Gerhard Munchobert Mystique Bjelland, MD 02/15/14 1431

## 2014-02-15 NOTE — ED Notes (Addendum)
Pt c/o bilateral shoulder stiffness/tightness w/ numbness and tingling in bilateral arms starting last night and "the feeling something is stuck in her throat" and intermittent chest discomfort/pressure starting this morning.  Pain score 9/10.  Pt reports that numbness and tingling have "almost resolved."  Hx of HTN.

## 2015-03-18 ENCOUNTER — Encounter: Payer: Self-pay | Admitting: Anesthesiology

## 2015-03-18 ENCOUNTER — Encounter: Payer: Self-pay | Admitting: *Deleted

## 2015-03-20 ENCOUNTER — Encounter: Payer: Self-pay | Admitting: *Deleted

## 2015-03-20 ENCOUNTER — Encounter
Admission: RE | Admit: 2015-03-20 | Discharge: 2015-03-20 | Disposition: A | Discharge status: Home / Self Care | Payer: BC Managed Care – PPO | Source: Ambulatory Visit | Attending: Unknown Physician Specialty | Admitting: Unknown Physician Specialty

## 2015-03-20 DIAGNOSIS — K219 Gastro-esophageal reflux disease without esophagitis: Secondary | ICD-10-CM | POA: Diagnosis not present

## 2015-03-20 DIAGNOSIS — M13869 Other specified arthritis, unspecified knee: Secondary | ICD-10-CM | POA: Diagnosis not present

## 2015-03-20 DIAGNOSIS — M23200 Derangement of unspecified lateral meniscus due to old tear or injury, right knee: Secondary | ICD-10-CM | POA: Diagnosis present

## 2015-03-20 DIAGNOSIS — I10 Essential (primary) hypertension: Secondary | ICD-10-CM | POA: Diagnosis not present

## 2015-03-20 DIAGNOSIS — M23251 Derangement of posterior horn of lateral meniscus due to old tear or injury, right knee: Secondary | ICD-10-CM | POA: Diagnosis not present

## 2015-03-20 DIAGNOSIS — J45909 Unspecified asthma, uncomplicated: Secondary | ICD-10-CM | POA: Diagnosis not present

## 2015-03-20 DIAGNOSIS — F40240 Claustrophobia: Secondary | ICD-10-CM | POA: Diagnosis not present

## 2015-03-20 LAB — POTASSIUM: Potassium: 3.2 mmol/L — ABNORMAL LOW (ref 3.5–5.1)

## 2015-03-20 NOTE — Patient Instructions (Signed)
  Your procedure is scheduled on: 03-22-15 Report to [facility] SAME [facility] 2ND FLOOR To find out your arrival time please call [phone] between 1PM - 3PM on 03-21-15 (THURSDAY)  Remember: Instructions that are not followed completely may result in serious medical risk, up to and including death, or upon the discretion of your surgeon and anesthesiologist your surgery may need to be rescheduled.    _X___ 1. Do not eat food or drink liquids after midnight. No gum chewing or hard candies.     _X___ 2. No Alcohol for 24 hours before or after surgery.   ____ 3. Bring all medications with you on the day of surgery if instructed.    _X___ 4. Notify your doctor if there is any change in your medical condition     (cold, fever, infections).     Do not wear jewelry, make-up, hairpins, clips or nail polish.  Do not wear lotions, powders, or perfumes. You may wear deodorant.  Do not shave 48 hours prior to surgery. Men may shave face and neck.  Do not bring valuables to the hospital.    [facility] is not responsible for any belongings or valuables.               Contacts, dentures or bridgework may not be worn into surgery.  Leave your suitcase in the car. After surgery it may be brought to your room.  For patients admitted to the hospital, discharge time is determined by your  treatment team.   Patients discharged the day of surgery will not be allowed to drive home.   Please read over the following fact sheets that you were given:      _X___ Take these medicines the morning of surgery with A SIP OF WATER:    1. NEXIUM  2. TAKE AN EXTRA NEXIUM Thursday NIGHT  3.   4.  5.  6.  ____ Fleet Enema (as directed)   __X__ Use CHG Soap as directed  ____ Use inhalers on the day of surgery  ____ Stop metformin 2 days prior to surgery    ____ Take 1/2 of usual insulin dose the night before surgery and none on the morning of surgery.   ____ Stop  Coumadin/Plavix/aspirin-N/A  __X__ Stop Anti-inflammatories-STOP MELOXICAM NOW-NO NSAIDS OR ASPIRIN PRODUCTS-TYLENOL/TRAMADOL OK   ____ Stop supplements until after surgery.    ____ Bring C-Pap to the hospital.

## 2015-03-21 NOTE — Pre-Procedure Instructions (Signed)
CALLED DR Lewis Moccasin OFFICE AND SPOKE WITH RECEPTIONIST ABOUT LOW POTASSIUM AND THAT PT HAS SURGERY TOMORROW.  I ALSO FAXED POTASSIUM RESULT TO DR Lewis Moccasin OFFICE AND RECEIVED A FAX CONFIRMATION THAT IT WENT THRU. RECEPTIONIST SAID SHE WOULD GIVE RESULT TO DR Gavin Potters

## 2015-03-22 ENCOUNTER — Encounter: Payer: Self-pay | Admitting: *Deleted

## 2015-03-22 ENCOUNTER — Encounter
Admission: RE | Disposition: A | Discharge status: Home / Self Care | Payer: Self-pay | Source: Ambulatory Visit | Attending: Unknown Physician Specialty

## 2015-03-22 ENCOUNTER — Ambulatory Visit
Admission: RE | Admit: 2015-03-22 | Discharge: 2015-03-22 | Disposition: A | Discharge status: Home / Self Care | Payer: BC Managed Care – PPO | Source: Ambulatory Visit | Attending: Unknown Physician Specialty | Admitting: Unknown Physician Specialty

## 2015-03-22 ENCOUNTER — Encounter: Payer: Self-pay | Admitting: Anesthesiology

## 2015-03-22 ENCOUNTER — Encounter: Payer: BC Managed Care – PPO | Admitting: Anesthesiology

## 2015-03-22 DIAGNOSIS — M13869 Other specified arthritis, unspecified knee: Secondary | ICD-10-CM | POA: Insufficient documentation

## 2015-03-22 DIAGNOSIS — I10 Essential (primary) hypertension: Secondary | ICD-10-CM | POA: Insufficient documentation

## 2015-03-22 DIAGNOSIS — K219 Gastro-esophageal reflux disease without esophagitis: Secondary | ICD-10-CM | POA: Insufficient documentation

## 2015-03-22 DIAGNOSIS — M23251 Derangement of posterior horn of lateral meniscus due to old tear or injury, right knee: Secondary | ICD-10-CM | POA: Diagnosis not present

## 2015-03-22 DIAGNOSIS — J45909 Unspecified asthma, uncomplicated: Secondary | ICD-10-CM | POA: Insufficient documentation

## 2015-03-22 DIAGNOSIS — F40240 Claustrophobia: Secondary | ICD-10-CM | POA: Insufficient documentation

## 2015-03-22 HISTORY — PX: KNEE ARTHROSCOPY: SHX127

## 2015-03-22 HISTORY — DX: Unspecified osteoarthritis, unspecified site: M19.90

## 2015-03-22 HISTORY — DX: Claustrophobia: F40.240

## 2015-03-22 LAB — POCT I-STAT 4, (NA,K, GLUC, HGB,HCT)
GLUCOSE: 96 mg/dL (ref 65–99)
HCT: 38 % (ref 36.0–46.0)
Hemoglobin: 12.9 g/dL (ref 12.0–15.0)
POTASSIUM: 3.6 mmol/L (ref 3.5–5.1)
SODIUM: 141 mmol/L (ref 135–145)

## 2015-03-22 SURGERY — ARTHROSCOPY, KNEE
Anesthesia: General | Laterality: Right | Wound class: Clean

## 2015-03-22 SURGERY — ARTHROSCOPY, KNEE
Anesthesia: Choice | Laterality: Right

## 2015-03-22 MED ORDER — FENTANYL CITRATE (PF) 100 MCG/2ML IJ SOLN
INTRAMUSCULAR | Status: AC
  Filled 2015-03-22: qty 2

## 2015-03-22 MED ORDER — KETOROLAC TROMETHAMINE 30 MG/ML IJ SOLN
INTRAMUSCULAR | Status: DC | PRN
  Administered 2015-03-22: 30 mg via INTRAVENOUS

## 2015-03-22 MED ORDER — HYDROCODONE-ACETAMINOPHEN 5-325 MG PO TABS
1.0000 | ORAL_TABLET | Freq: Four times a day (QID) | ORAL | Status: DC | PRN
  Administered 2015-03-22: 1 via ORAL

## 2015-03-22 MED ORDER — PROMETHAZINE HCL 25 MG/ML IJ SOLN: 6.2500 mg | INTRAMUSCULAR | Status: DC | PRN

## 2015-03-22 MED ORDER — LACTATED RINGERS IV SOLN
INTRAVENOUS | Status: DC
  Administered 2015-03-22 (×2): via INTRAVENOUS

## 2015-03-22 MED ORDER — FENTANYL CITRATE (PF) 100 MCG/2ML IJ SOLN
INTRAMUSCULAR | Status: DC
Start: 2015-03-22 — End: 2015-03-22
  Filled 2015-03-22: qty 2

## 2015-03-22 MED ORDER — ONDANSETRON HCL 4 MG/2ML IJ SOLN
INTRAMUSCULAR | Status: DC | PRN
  Administered 2015-03-22: 4 mg via INTRAVENOUS

## 2015-03-22 MED ORDER — LIDOCAINE HCL (CARDIAC) 20 MG/ML IV SOLN
INTRAVENOUS | Status: DC | PRN
  Administered 2015-03-22: 30 mg via INTRAVENOUS

## 2015-03-22 MED ORDER — FENTANYL CITRATE (PF) 100 MCG/2ML IJ SOLN
25.0000 ug | INTRAMUSCULAR | Status: DC | PRN
  Administered 2015-03-22: 25 ug via INTRAVENOUS
  Administered 2015-03-22: 50 ug via INTRAVENOUS
  Administered 2015-03-22 (×3): 25 ug via INTRAVENOUS

## 2015-03-22 MED ORDER — HYDROCODONE-ACETAMINOPHEN 5-325 MG PO TABS
ORAL_TABLET | ORAL | Status: AC
  Filled 2015-03-22: qty 1

## 2015-03-22 MED ORDER — MIDAZOLAM HCL 2 MG/2ML IJ SOLN
INTRAMUSCULAR | Status: DC | PRN
  Administered 2015-03-22: 2 mg via INTRAVENOUS

## 2015-03-22 MED ORDER — PROPOFOL 10 MG/ML IV BOLUS
INTRAVENOUS | Status: DC | PRN
  Administered 2015-03-22: 200 mg via INTRAVENOUS

## 2015-03-22 MED ORDER — FENTANYL CITRATE (PF) 100 MCG/2ML IJ SOLN
INTRAMUSCULAR | Status: DC | PRN
  Administered 2015-03-22: 100 ug via INTRAVENOUS

## 2015-03-22 MED ORDER — DEXAMETHASONE SODIUM PHOSPHATE 4 MG/ML IJ SOLN
INTRAMUSCULAR | Status: DC | PRN
  Administered 2015-03-22: 4 mg via INTRAVENOUS

## 2015-03-22 MED ORDER — HYDROMORPHONE HCL 1 MG/ML IJ SOLN: 0.2500 mg | INTRAMUSCULAR | Status: DC | PRN

## 2015-03-22 MED ORDER — BUPIVACAINE HCL (PF) 0.5 % IJ SOLN
INTRAMUSCULAR | Status: DC | PRN
  Administered 2015-03-22: 10 mL

## 2015-03-22 MED ORDER — NORCO 5-325 MG PO TABS: 1.0000 | ORAL_TABLET | Freq: Four times a day (QID) | ORAL | Status: DC | PRN

## 2015-03-22 MED ORDER — BUPIVACAINE HCL (PF) 0.5 % IJ SOLN
INTRAMUSCULAR | Status: AC
  Filled 2015-03-22: qty 30

## 2015-03-22 SURGICAL SUPPLY — 38 items
ARTHROWAND PARAGON T2 (SURGICAL WAND)
BLADE ABRADER 4.5 (BLADE) ×3 IMPLANT
BLADE FULL RADIUS 3.5 (BLADE) ×2 IMPLANT
BLADE SHAVER 4.5X7 STR FR (MISCELLANEOUS) ×1 IMPLANT
BNDG ESMARK 6X12 TAN STRL LF (GAUZE/BANDAGES/DRESSINGS) ×3 IMPLANT
BUR ABRADER 4.0 W/FLUTE AQUA (MISCELLANEOUS) ×1 IMPLANT
BURR ABRADER 4.0 W/FLUTE AQUA (MISCELLANEOUS)
CHLORAPREP W/TINT 26ML (MISCELLANEOUS) ×3 IMPLANT
DRAPE LEGGINS SURG 28X43 STRL (DRAPES) ×3 IMPLANT
GAUZE SPONGE 4X4 12PLY STRL (GAUZE/BANDAGES/DRESSINGS) ×3 IMPLANT
GLOVE BIO SURGEON STRL SZ7.5 (GLOVE) ×3 IMPLANT
GLOVE BIO SURGEON STRL SZ8 (GLOVE) ×3 IMPLANT
GLOVE INDICATOR 8.0 STRL GRN (GLOVE) ×3 IMPLANT
GOWN STRL REUS W/ TWL LRG LVL3 (GOWN DISPOSABLE) ×1 IMPLANT
GOWN STRL REUS W/TWL LRG LVL3 (GOWN DISPOSABLE) ×3
GOWN STRL REUS W/TWL LRG LVL4 (GOWN DISPOSABLE) ×3 IMPLANT
IV LACTATED RINGER IRRG 3000ML (IV SOLUTION) ×6
IV LR IRRIG 3000ML ARTHROMATIC (IV SOLUTION) ×2 IMPLANT
KIT RM TURNOVER STRD PROC AR (KITS) ×3 IMPLANT
MANIFOLD 4PT FOR NEPTUNE1 (MISCELLANEOUS) ×3 IMPLANT
PACK ARTHROSCOPY KNEE (MISCELLANEOUS) ×3 IMPLANT
PADDING CAST 4IN STRL (MISCELLANEOUS)
PADDING CAST BLEND 4X4 STRL (MISCELLANEOUS) IMPLANT
SET TUBE SUCT SHAVER OUTFL 24K (TUBING) ×3 IMPLANT
SET TUBE TIP INTRA-ARTICULAR (MISCELLANEOUS) ×1 IMPLANT
SOL PREP PVP 2OZ (MISCELLANEOUS) ×3
SOLUTION PREP PVP 2OZ (MISCELLANEOUS) ×1 IMPLANT
SUT ETH BLK MONO 3 0 FS 1 12/B (SUTURE) ×3 IMPLANT
SUT ETHILON 3-0 FS-10 30 BLK (SUTURE) ×3
SUTURE EHLN 3-0 FS-10 30 BLK (SUTURE) ×1 IMPLANT
TAPE MICROFOAM 4IN (TAPE) ×3 IMPLANT
TUBING ARTHRO INFLOW-ONLY STRL (TUBING) ×3 IMPLANT
WAND 30 DEG SABER W/CORD (SURGICAL WAND) ×1 IMPLANT
WAND ARTHRO PARAGON T2 (SURGICAL WAND) IMPLANT
WAND COVAC 50 IFS (MISCELLANEOUS) IMPLANT
WAND HAND CNTRL MULTIVAC 50 (MISCELLANEOUS) IMPLANT
WAND HAND CNTRL MULTIVAC 90 (MISCELLANEOUS) IMPLANT
WRAP KNEE W/COLD PACKS 25.5X14 (SOFTGOODS) ×3 IMPLANT

## 2015-03-22 NOTE — Anesthesia Preprocedure Evaluation (Signed)
Anesthesia Evaluation  Patient identified by MRN, date of birth, ID band Patient awake    Reviewed: Allergy & Precautions, H&P , NPO status , Patient's Chart, lab work & pertinent test results, reviewed documented beta blocker date and time   History of Anesthesia Complications Negative for: history of anesthetic complications  Airway Mallampati: II  TM Distance: >3 FB Neck ROM: full    Dental no notable dental hx. (+) Teeth Intact   Pulmonary neg shortness of breath, asthma , neg sleep apnea, neg COPD, neg recent URI,    Pulmonary exam normal breath sounds clear to auscultation       Cardiovascular Exercise Tolerance: Good hypertension, On Medications (-) angina(-) CAD, (-) Past MI, (-) Cardiac Stents and (-) CABG Normal cardiovascular exam(-) dysrhythmias (-) Valvular Problems/Murmurs Rhythm:regular Rate:Normal     Neuro/Psych negative neurological ROS  negative psych ROS   GI/Hepatic Neg liver ROS, GERD  Medicated and Controlled,  Endo/Other  negative endocrine ROS  Renal/GU negative Renal ROS  negative genitourinary   Musculoskeletal   Abdominal   Peds  Hematology negative hematology ROS (+)   Anesthesia Other Findings Past Medical History:   Seasonal allergies                                           Hypertension                                                 Arthritis                                                      Comment:knee   Claustrophobia                                               Reproductive/Obstetrics negative OB ROS                             Anesthesia Physical Anesthesia Plan  ASA: II  Anesthesia Plan: General   Post-op Pain Management:    Induction:   Airway Management Planned:   Additional Equipment:   Intra-op Plan:   Post-operative Plan:   Informed Consent: I have reviewed the patients History and Physical, chart, labs and discussed the  procedure including the risks, benefits and alternatives for the proposed anesthesia with the patient or authorized representative who has indicated his/her understanding and acceptance.   Dental Advisory Given  Plan Discussed with: Anesthesiologist, CRNA and Surgeon  Anesthesia Plan Comments:         Anesthesia Quick Evaluation

## 2015-03-22 NOTE — H&P (Signed)
  H and P reviewed. No changes. Uploaded at later date. 

## 2015-03-22 NOTE — Op Note (Signed)
Patient: Chelsea Wheeler  Preoperative diagnosis: Torn lateral meniscus right knee with lateral compartment chondral lesions  Postop diagnosis: Same plus medial femoral chondral lesion  Operation: Arthroscopic partial lateral meniscectomy along with debridement and microfracture of the medial femoral chondral lesion  Surgeon: Randon Goldsmith, MD  Anesthesia: Gen.   History: Patient's had a long history of right knee pain.  The plain films revealed mild lateral compartment narrowing .  The patient had an MRI which revealed a torn lateral meniscus along with chondral changes in the lateral compartment and to a lesser degree on the medial femoral condyle.The patient was scheduled for surgery due to persistent discomfort despite conservative treatment.  The patient was taken the operating room where satisfactory general anesthesia was achieved. A tourniquet and leg holder were was applied to the right thigh. A well leg support was applied to the nonoperative extremity. The right knee was prepped and draped in usual fashion for an arthroscopic procedure. An inflow cannula was introduced superomedially. The joint was distended with lactated Ringer's. Scope was introduced through an inferolateral puncture wound and a probe through an inferomedial puncture wound. Inspection of the medial compartment revealed  a grade 2, almost grade 3 chondral lesion in the mid weightbearing portion of the medial femoral condyle. The lesion was about a centimeter and a half in length and about 3-1/2 mm wide. I went ahead and debrided this lesion down to and through the calcified cartilage layer and then microfractured the lesion with a 45 awl. Inspection of the intercondylar notch revealed intact cruciates. Inspection of the the lateral compartment revealed a degenerative tear of the posterior horn of the lateral meniscus along with essentially grade 4 chondral changes in the mid weightbearing portion of the lateral  femoral condyle.  I performed an arthroscopic partial lateral meniscectomy and debrided the lateral femoral chondral lesion. Trochlear groove was inspected and appeared to be fairly smooth.  Retropatellar surface was mildly fibrillated. I debrided the retropatellar surface with a turbowhisker resector. I observed patella tracking from the superomedial portal. The patella seemed to track fairly well.  The instruments were removed from the joint at this time. The puncture wounds were closed with 3-0 nylon in vertical mattress fashion. I injected each puncture wound with several cc of half percent Marcaine without epinephrine. Betadine was applied the wounds followed by sterile dressing. An ice pack was applied to the right knee. The patient was awakened and transferred to the stretcher bed. The patient was taken to the recovery room in satisfactory condition.  The tourniquet was not inflated during the course of the procedure. Blood loss was negligible.

## 2015-03-22 NOTE — Discharge Instructions (Signed)
° °  [facility]  °Chelsea Wheeler, Jr., M.D. [phone]  ° °KNEE ARTHROSCOPY POST OPERATION INSTRUCTIONS: ° °PLEASE READ THESE INSTRUCTIONS ABOUT POST OPERATION CARE. THEY WILL ANSWER MOST OF YOUR QUESTIONS.  °You have been given a prescription for pain. Please take as directed for pain.  °You can walk, keeping the knee slightly stiff-avoid doing too much bending the first day. (if ACL reconstruction is performed, keep brace locked in extension when walking.)  °You will use crutches or cane if needed. Can weight bear as tolerated  °Plan to take three to four days off from work. You can resume work when you are comfortable. (This can be a week or more, depending on the type of work you do.)  °To reduce pain and swelling, place one to two pillows under the knee the first two or three days when sitting or lying. An ice pack may be placed on top of the area over the dressing. Instructions for making homemade icepack are as follow:  °Flexible homemade alcohol water ice pack  °2 cups water  °1 cup rubbing alcohol  °food coloring for the blue tint (optional)  °2 zip-top bags - gallon-size  °Mix the water and alcohol together in one of your zip-top bags and add food coloring. Release as much air as possible and seal the bag. Place in freezer for at least 12 hours.  °The small incisions in your knee are closed with nylon stitches. They will be removed in the office.  °The bulky dressing may be removed in the third day after surgery. (If ACL surgery-DO NOT REMOVE BANDAGES). Put a waterproof band-aid over each stitch. Do not put any creams or ointments on wounds. You may shower at this time, but change waterproof band-aids after showering. KEEP INCISIONS CLEAN AND DRY UNTIL YOU RETURN TO THE OFFICE.  °Sometimes the operative area remains somewhat painful and swollen for several weeks. This is usually nothing to worry about, but call if you have any excessive symptoms, especially  fever. It is not unusual to have a low grade fever of 99 degrees for the first few days. If persist after 3-4 days call the office. It is not uncommon for the pain to be a little worse on the third day after surgery.  °Begin doing gentle exercises right away. They will be limited by the amount of pain and swelling you have.  Exercising will reduce the swelling, increase motion, and prevent muscle weakness. Exercises: Straight leg raising and gentle knee bending.  °Take 81 milligram aspirin twice a day for 2 weeks after meals or milk. This along with elevation will help reduce the possibility of phlebitis in your operated leg.  °Avoid strenuous athletics for a minimum of 4 to 6 weeks after arthroscopic surgery (approximately five months if ACL surgery).  °If the surgery included ACL reconstruction the brace that is supplied to the extremity post surgery is to be locked in extension when you are asleep and is to be locked in extension when you are ambulating. It can be unlocked for exercises or sitting.  °Keep your post surgery appointment that has been made for you. If you do not remember the date call [phone]. Your follow up appointment should be between 7-10 days.  ° °

## 2015-03-22 NOTE — Transfer of Care (Signed)
Immediate Anesthesia Transfer of Care Note  Patient: Chelsea Wheeler  Procedure(s) Performed: Procedure(s): ARTHROSCOPY KNEE, partial lateral meniscectomy, microfracture, chondraplasty (Right)  Patient Location: PACU  Anesthesia Type:General  Level of Consciousness: awake  Airway & Oxygen Therapy: Patient Spontanous Breathing  Post-op Assessment: Report given to RN  Post vital signs: stable  Last Vitals:  Filed Vitals:   03/22/15 1026  BP: 140/83  Pulse: 66  Temp: 36.7 C  Resp: 16    Complications: No apparent anesthesia complications

## 2015-03-27 NOTE — Anesthesia Postprocedure Evaluation (Signed)
  Anesthesia Post-op Note  Patient: Chelsea Wheeler  Procedure(s) Performed: Procedure(s): ARTHROSCOPY KNEE, partial lateral meniscectomy, microfracture, chondraplasty (Right)  Anesthesia type:General  Patient location: PACU  Post pain: Pain level controlled  Post assessment: Post-op Vital signs reviewed, Patient's Cardiovascular Status Stable, Respiratory Function Stable, Patent Airway and No signs of Nausea or vomiting  Post vital signs: Reviewed and stable  Last Vitals:  Filed Vitals:   03/22/15 1519  BP: 124/75  Pulse: 62  Temp:   Resp: 16    Level of consciousness: awake, alert  and patient cooperative  Complications: No apparent anesthesia complications

## 2015-08-28 ENCOUNTER — Other Ambulatory Visit: Payer: Self-pay | Admitting: Physician Assistant

## 2015-08-30 ENCOUNTER — Encounter (HOSPITAL_COMMUNITY): Payer: Self-pay

## 2015-08-30 ENCOUNTER — Encounter (HOSPITAL_COMMUNITY)
Admission: RE | Admit: 2015-08-30 | Discharge: 2015-08-30 | Disposition: A | Discharge status: Home / Self Care | Payer: BC Managed Care – PPO | Source: Ambulatory Visit | Attending: Orthopaedic Surgery | Admitting: Orthopaedic Surgery

## 2015-08-30 DIAGNOSIS — Z0183 Encounter for blood typing: Secondary | ICD-10-CM | POA: Diagnosis not present

## 2015-08-30 DIAGNOSIS — Z01812 Encounter for preprocedural laboratory examination: Secondary | ICD-10-CM | POA: Diagnosis not present

## 2015-08-30 DIAGNOSIS — M1711 Unilateral primary osteoarthritis, right knee: Secondary | ICD-10-CM | POA: Insufficient documentation

## 2015-08-30 HISTORY — DX: Gastro-esophageal reflux disease without esophagitis: K21.9

## 2015-08-30 LAB — CBC
HCT: 37.1 % (ref 36.0–46.0)
Hemoglobin: 12.3 g/dL (ref 12.0–15.0)
MCH: 29.5 pg (ref 26.0–34.0)
MCHC: 33.2 g/dL (ref 30.0–36.0)
MCV: 89 fL (ref 78.0–100.0)
PLATELETS: 174 10*3/uL (ref 150–400)
RBC: 4.17 MIL/uL (ref 3.87–5.11)
RDW: 12.7 % (ref 11.5–15.5)
WBC: 7.2 10*3/uL (ref 4.0–10.5)

## 2015-08-30 LAB — BASIC METABOLIC PANEL
Anion gap: 9 (ref 5–15)
BUN: 8 mg/dL (ref 6–20)
CALCIUM: 9.6 mg/dL (ref 8.9–10.3)
CO2: 28 mmol/L (ref 22–32)
CREATININE: 0.94 mg/dL (ref 0.44–1.00)
Chloride: 104 mmol/L (ref 101–111)
GFR calc Af Amer: >60 mL/min (ref >60)
GFR calc non Af Amer: >60 mL/min (ref >60)
GLUCOSE: 110 mg/dL — AB (ref 65–99)
Potassium: 3.3 mmol/L — ABNORMAL LOW (ref 3.5–5.1)
Sodium: 141 mmol/L (ref 135–145)

## 2015-08-30 LAB — SURGICAL PCR SCREEN
MRSA, PCR: NEGATIVE
STAPHYLOCOCCUS AUREUS: NEGATIVE

## 2015-08-30 LAB — ABO/RH: ABO/RH(D): O NEG

## 2015-08-30 NOTE — Patient Instructions (Signed)
Chelsea AhrBridget M Wheeler  08/30/2015   Your procedure is scheduled on: 09/06/2015    [facility]  Entrance take [place]  elevators to 3rd floor to  Short Stay Center at    0530 AM.  Call this number if you have problems the morning of surgery [phone]   Remember: ONLY 1 PERSON MAY GO WITH YOU TO SHORT STAY TO GET  READY MORNING OF YOUR SURGERY.  Do not eat food or drink liquids :After Midnight.     Take these medicines the morning of surgery with A SIP OF WATER: Zyrtec if needed, Nexium, Flonase                                 You may not have any metal on your body including hair pins and              piercings  Do not wear jewelry, make-up, lotions, powders or perfumes, deodorant             Do not wear nail polish.  Do not shave  48 hours prior to surgery.              Do not bring valuables to the hospital. [facility] IS NOT             RESPONSIBLE   FOR VALUABLES.  Contacts, dentures or bridgework may not be worn into surgery.  Leave suitcase in the car. After surgery it may be brought to your room.       Special Instructions: coughing and deep breathing exercises, leg exercises               Please read over the following fact sheets you were given: _____________________________________________________________________             [facility] - Preparing [facility] Before surgery, you can play an important role.  Because skin is not sterile, your skin needs to be as free of germs as possible.  You can reduce the number of germs on your skin by washing with CHG (chlorahexidine gluconate) soap before surgery.  CHG is an antiseptic cleaner which kills germs and bonds with the skin to continue killing germs even after washing. Please DO NOT use if you have an allergy to CHG or antibacterial soaps.  If your skin becomes reddened/irritated stop using the CHG and inform your nurse when you arrive at Short Stay. Do not shave (including legs and  underarms) for at least 48 hours prior to the first CHG shower.  You may shave your face/neck. Please follow these instructions carefully:  1.  Shower with CHG Soap the night before surgery and the  morning of Surgery.  2.  If you choose to wash your hair, wash your hair first as usual with your  normal  shampoo.  3.  After you shampoo, rinse your hair and body thoroughly to remove the  shampoo.                           4.  Use CHG as you would any other liquid soap.  You can apply chg directly  to the skin and wash  Gently with a scrungie or clean washcloth.  5.  Apply the CHG Soap to your body ONLY FROM THE NECK DOWN.   Do not use on face/ open                           Wound or open sores. Avoid contact with eyes, ears mouth and genitals (private parts).                       Wash face,  Genitals (private parts) with your normal soap.             6.  Wash thoroughly, paying special attention to the area where your surgery  will be performed.  7.  Thoroughly rinse your body with warm water from the neck down.  8.  DO NOT shower/wash with your normal soap after using and rinsing off  the CHG Soap.                9.  Pat yourself dry with a clean towel.            10.  Wear clean pajamas.            11.  Place clean sheets on your bed the night of your first shower and do not  sleep with pets. Day of Surgery : Do not apply any lotions/deodorants the morning of surgery.  Please wear clean clothes to the hospital/surgery center.  FAILURE TO FOLLOW THESE INSTRUCTIONS MAY RESULT IN THE CANCELLATION OF YOUR SURGERY PATIENT SIGNATURE_________________________________  NURSE SIGNATURE__________________________________  ________________________________________________________________________  WHAT IS A BLOOD TRANSFUSION? Blood Transfusion Information  A transfusion is the replacement of blood or some of its parts. Blood is made up of multiple cells which provide different  functions.  Red blood cells carry oxygen and are used for blood loss replacement.  White blood cells fight against infection.  Platelets control bleeding.  Plasma helps clot blood.  Other blood products are available for specialized needs, such as hemophilia or other clotting disorders. BEFORE THE TRANSFUSION  Who gives blood for transfusions?   Healthy volunteers who are fully evaluated to make sure their blood is safe. This is blood bank blood. Transfusion therapy is the safest it has ever been in the practice of medicine. Before blood is taken from a donor, a complete history is taken to make sure that person has no history of diseases nor engages in risky social behavior (examples are intravenous drug use or sexual activity with multiple partners). The donor's travel history is screened to minimize risk of transmitting infections, such as malaria. The donated blood is tested for signs of infectious diseases, such as HIV and hepatitis. The blood is then tested to be sure it is compatible with you in order to minimize the chance of a transfusion reaction. If you or a relative donates blood, this is often done in anticipation of surgery and is not appropriate for emergency situations. It takes many days to process the donated blood. RISKS AND COMPLICATIONS Although transfusion therapy is very safe and saves many lives, the main dangers of transfusion include:  1. Getting an infectious disease. 2. Developing a transfusion reaction. This is an allergic reaction to something in the blood you were given. Every precaution is taken to prevent this. The decision to have a blood transfusion has been considered carefully by your caregiver before blood is given. Blood is not given unless the benefits outweigh  the risks. AFTER THE TRANSFUSION  Right after receiving a blood transfusion, you will usually feel much better and more energetic. This is especially true if your red blood cells have gotten low  (anemic). The transfusion raises the level of the red blood cells which carry oxygen, and this usually causes an energy increase.  The nurse administering the transfusion will monitor you carefully for complications. HOME CARE INSTRUCTIONS  No special instructions are needed after a transfusion. You may find your energy is better. Speak with your caregiver about any limitations on activity for underlying diseases you may have. SEEK MEDICAL CARE IF:   Your condition is not improving after your transfusion.  You develop redness or irritation at the intravenous (IV) site. SEEK IMMEDIATE MEDICAL CARE IF:  Any of the following symptoms occur over the next 12 hours:  Shaking chills.  You have a temperature by mouth above 102 F (38.9 C), not controlled by medicine.  Chest, back, or muscle pain.  People around you feel you are not acting correctly or are confused.  Shortness of breath or difficulty breathing.  Dizziness and fainting.  You get a rash or develop hives.  You have a decrease in urine output.  Your urine turns a dark color or changes to pink, red, or brown. Any of the following symptoms occur over the next 10 days:  You have a temperature by mouth above 102 F (38.9 C), not controlled by medicine.  Shortness of breath.  Weakness after normal activity.  The white part of the eye turns yellow (jaundice).  You have a decrease in the amount of urine or are urinating less often.  Your urine turns a dark color or changes to pink, red, or brown. Document Released: 06/12/2000 Document Revised: 09/07/2011 Document Reviewed: 01/30/2008 ExitCare Patient Information 2014 [place].  _______________________________________________________________________  Incentive Spirometer  An incentive spirometer is a tool that can help keep your lungs clear and active. This tool measures how well you are filling your lungs with each breath. Taking long deep breaths may help  reverse or decrease the chance of developing breathing (pulmonary) problems (especially infection) following:  A long period of time when you are unable to move or be active. BEFORE THE PROCEDURE   If the spirometer includes an indicator to show your best effort, your nurse or respiratory therapist will set it to a desired goal.  If possible, sit up straight or lean slightly forward. Try not to slouch.  Hold the incentive spirometer in an upright position. INSTRUCTIONS FOR USE  3. Sit on the edge of your bed if possible, or sit up as far as you can in bed or on a chair. 4. Hold the incentive spirometer in an upright position. 5. Breathe out normally. 6. Place the mouthpiece in your mouth and seal your lips tightly around it. 7. Breathe in slowly and as deeply as possible, raising the piston or the ball toward the top of the column. 8. Hold your breath for 3-5 seconds or for as long as possible. Allow the piston or ball to fall to the bottom of the column. 9. Remove the mouthpiece from your mouth and breathe out normally. 10. Rest for a few seconds and repeat Steps 1 through 7 at least 10 times every 1-2 hours when you are awake. Take your time and take a few normal breaths between deep breaths. 11. The spirometer may include an indicator to show your best effort. Use the indicator as a goal to work  toward during each repetition. 12. After each set of 10 deep breaths, practice coughing to be sure your lungs are clear. If you have an incision (the cut made at the time of surgery), support your incision when coughing by placing a pillow or rolled up towels firmly against it. Once you are able to get out of bed, walk around indoors and cough well. You may stop using the incentive spirometer when instructed by your caregiver.  RISKS AND COMPLICATIONS  Take your time so you do not get dizzy or light-headed.  If you are in pain, you may need to take or ask for pain medication before doing  incentive spirometry. It is harder to take a deep breath if you are having pain. AFTER USE  Rest and breathe slowly and easily.  It can be helpful to keep track of a log of your progress. Your caregiver can provide you with a simple table to help with this. If you are using the spirometer at home, follow these instructions: [place] IF:   You are having difficultly using the spirometer.  You have trouble using the spirometer as often as instructed.  Your pain medication is not giving enough relief while using the spirometer.  You develop fever of 100.5 F (38.1 C) or higher. SEEK IMMEDIATE MEDICAL CARE IF:   You cough up bloody sputum that had not been present before.  You develop fever of 102 F (38.9 C) or greater.  You develop worsening pain at or near the incision site. MAKE SURE YOU:   Understand these instructions.  Will watch your condition.  Will get help right away if you are not doing well or get worse. Document Released: 10/26/2006 Document Revised: 09/07/2011 Document Reviewed: 12/27/2006 [facility] Patient Information 2014 [place], [place].   ________________________________________________________________________

## 2015-08-30 NOTE — Progress Notes (Signed)
EKG-03/20/15- EPIC  

## 2015-08-30 NOTE — Progress Notes (Signed)
BMp done 08/30/15 faxed via EPIC to Dr Allie Bossierhris Blackman.

## 2015-09-06 ENCOUNTER — Encounter (HOSPITAL_COMMUNITY)
Admission: RE | Disposition: A | Discharge status: Home Health | Payer: Self-pay | Source: Ambulatory Visit | Attending: Orthopaedic Surgery

## 2015-09-06 ENCOUNTER — Inpatient Hospital Stay (HOSPITAL_COMMUNITY): Payer: BC Managed Care – PPO | Admitting: Anesthesiology

## 2015-09-06 ENCOUNTER — Encounter (HOSPITAL_COMMUNITY): Payer: Self-pay

## 2015-09-06 ENCOUNTER — Inpatient Hospital Stay (HOSPITAL_COMMUNITY): Payer: BC Managed Care – PPO

## 2015-09-06 ENCOUNTER — Inpatient Hospital Stay (HOSPITAL_COMMUNITY)
Admission: RE | Admit: 2015-09-06 | Discharge: 2015-09-08 | DRG: 470 | Disposition: A | Discharge status: Home Health | Payer: BC Managed Care – PPO | Source: Ambulatory Visit | Attending: Orthopaedic Surgery | Admitting: Orthopaedic Surgery

## 2015-09-06 DIAGNOSIS — Z01812 Encounter for preprocedural laboratory examination: Secondary | ICD-10-CM | POA: Diagnosis not present

## 2015-09-06 DIAGNOSIS — E876 Hypokalemia: Secondary | ICD-10-CM | POA: Diagnosis not present

## 2015-09-06 DIAGNOSIS — I10 Essential (primary) hypertension: Secondary | ICD-10-CM | POA: Diagnosis present

## 2015-09-06 DIAGNOSIS — Z96651 Presence of right artificial knee joint: Secondary | ICD-10-CM

## 2015-09-06 DIAGNOSIS — Z79899 Other long term (current) drug therapy: Secondary | ICD-10-CM

## 2015-09-06 DIAGNOSIS — K219 Gastro-esophageal reflux disease without esophagitis: Secondary | ICD-10-CM | POA: Diagnosis present

## 2015-09-06 DIAGNOSIS — M1711 Unilateral primary osteoarthritis, right knee: Secondary | ICD-10-CM | POA: Diagnosis present

## 2015-09-06 DIAGNOSIS — M25561 Pain in right knee: Secondary | ICD-10-CM | POA: Diagnosis present

## 2015-09-06 HISTORY — PX: TOTAL KNEE ARTHROPLASTY: SHX125

## 2015-09-06 LAB — TYPE AND SCREEN
ABO/RH(D): O NEG
Antibody Screen: NEGATIVE

## 2015-09-06 SURGERY — ARTHROPLASTY, KNEE, TOTAL
Anesthesia: Spinal | Site: Knee | Laterality: Right

## 2015-09-06 MED ORDER — BUPIVACAINE LIPOSOME 1.3 % IJ SUSP
20.0000 mL | Freq: Once | INTRAMUSCULAR | Status: AC
  Administered 2015-09-06: 20 mL
  Filled 2015-09-06: qty 20

## 2015-09-06 MED ORDER — FENTANYL CITRATE (PF) 100 MCG/2ML IJ SOLN
INTRAMUSCULAR | Status: AC
  Filled 2015-09-06: qty 2

## 2015-09-06 MED ORDER — METOCLOPRAMIDE HCL 10 MG PO TABS: 5.0000 mg | ORAL_TABLET | Freq: Three times a day (TID) | ORAL | Status: DC | PRN

## 2015-09-06 MED ORDER — CLINDAMYCIN PHOSPHATE 900 MG/50ML IV SOLN
INTRAVENOUS | Status: AC
  Filled 2015-09-06: qty 50

## 2015-09-06 MED ORDER — ONDANSETRON HCL 4 MG/2ML IJ SOLN: 4.0000 mg | Freq: Once | INTRAMUSCULAR | Status: DC | PRN

## 2015-09-06 MED ORDER — PROPOFOL 10 MG/ML IV BOLUS
INTRAVENOUS | Status: AC
  Filled 2015-09-06: qty 20

## 2015-09-06 MED ORDER — METHOCARBAMOL 500 MG PO TABS
500.0000 mg | ORAL_TABLET | Freq: Four times a day (QID) | ORAL | Status: DC | PRN
  Administered 2015-09-06 – 2015-09-08 (×4): 500 mg via ORAL
  Filled 2015-09-06 (×4): qty 1

## 2015-09-06 MED ORDER — MIDAZOLAM HCL 2 MG/2ML IJ SOLN
INTRAMUSCULAR | Status: AC
  Filled 2015-09-06: qty 2

## 2015-09-06 MED ORDER — POTASSIUM CHLORIDE ER 10 MEQ PO TBCR
10.0000 meq | EXTENDED_RELEASE_TABLET | Freq: Two times a day (BID) | ORAL | Status: DC
  Administered 2015-09-07 – 2015-09-08 (×3): 10 meq via ORAL
  Filled 2015-09-06 (×6): qty 1

## 2015-09-06 MED ORDER — ANTIOXIDANT FORMULA SG PO CPCR: 1.0000 | ORAL_CAPSULE | Freq: Every day | ORAL | Status: DC

## 2015-09-06 MED ORDER — LACTATED RINGERS IV SOLN
INTRAVENOUS | Status: DC | PRN
  Administered 2015-09-06 (×2): via INTRAVENOUS

## 2015-09-06 MED ORDER — PROPOFOL 10 MG/ML IV BOLUS
INTRAVENOUS | Status: AC
  Filled 2015-09-06: qty 40

## 2015-09-06 MED ORDER — ADULT MULTIVITAMIN W/MINERALS CH
1.0000 | ORAL_TABLET | Freq: Every day | ORAL | Status: DC
  Administered 2015-09-07 – 2015-09-08 (×2): 1 via ORAL
  Filled 2015-09-06 (×3): qty 1

## 2015-09-06 MED ORDER — ALUM & MAG HYDROXIDE-SIMETH 200-200-20 MG/5ML PO SUSP: 30.0000 mL | ORAL | Status: DC | PRN

## 2015-09-06 MED ORDER — OXYCODONE HCL 5 MG PO TABS
5.0000 mg | ORAL_TABLET | ORAL | Status: DC | PRN
  Administered 2015-09-06 (×2): 10 mg via ORAL
  Filled 2015-09-06 (×2): qty 2

## 2015-09-06 MED ORDER — PHENOL 1.4 % MT LIQD: 1.0000 | OROMUCOSAL | Status: DC | PRN

## 2015-09-06 MED ORDER — FENTANYL CITRATE (PF) 100 MCG/2ML IJ SOLN
INTRAMUSCULAR | Status: DC | PRN
  Administered 2015-09-06 (×2): 50 ug via INTRAVENOUS

## 2015-09-06 MED ORDER — DOCUSATE SODIUM 100 MG PO CAPS
100.0000 mg | ORAL_CAPSULE | Freq: Two times a day (BID) | ORAL | Status: DC
  Administered 2015-09-07 – 2015-09-08 (×3): 100 mg via ORAL

## 2015-09-06 MED ORDER — PROPOFOL 500 MG/50ML IV EMUL
INTRAVENOUS | Status: DC | PRN
  Administered 2015-09-06: 50 ug/kg/min via INTRAVENOUS

## 2015-09-06 MED ORDER — RIVAROXABAN 10 MG PO TABS
10.0000 mg | ORAL_TABLET | Freq: Every day | ORAL | Status: DC
  Administered 2015-09-07 – 2015-09-08 (×2): 10 mg via ORAL
  Filled 2015-09-06 (×3): qty 1

## 2015-09-06 MED ORDER — ZOLPIDEM TARTRATE 5 MG PO TABS
5.0000 mg | ORAL_TABLET | Freq: Every evening | ORAL | Status: DC | PRN
  Administered 2015-09-06 – 2015-09-07 (×2): 5 mg via ORAL
  Filled 2015-09-06 (×2): qty 1

## 2015-09-06 MED ORDER — SODIUM CHLORIDE 0.9 % IR SOLN
Status: DC | PRN
  Administered 2015-09-06: 3000 mL

## 2015-09-06 MED ORDER — HYDROCHLOROTHIAZIDE 25 MG PO TABS
25.0000 mg | ORAL_TABLET | Freq: Every morning | ORAL | Status: DC
  Administered 2015-09-07 – 2015-09-08 (×2): 25 mg via ORAL
  Filled 2015-09-06 (×3): qty 1

## 2015-09-06 MED ORDER — 0.9 % SODIUM CHLORIDE (POUR BTL) OPTIME
TOPICAL | Status: DC | PRN
  Administered 2015-09-06: 1000 mL

## 2015-09-06 MED ORDER — CLINDAMYCIN PHOSPHATE 900 MG/50ML IV SOLN
900.0000 mg | INTRAVENOUS | Status: AC
  Administered 2015-09-06: 900 mg via INTRAVENOUS

## 2015-09-06 MED ORDER — FENTANYL CITRATE (PF) 100 MCG/2ML IJ SOLN
25.0000 ug | INTRAMUSCULAR | Status: DC | PRN
  Administered 2015-09-06 (×3): 50 ug via INTRAVENOUS

## 2015-09-06 MED ORDER — CLINDAMYCIN PHOSPHATE 600 MG/50ML IV SOLN
600.0000 mg | Freq: Four times a day (QID) | INTRAVENOUS | Status: AC
  Administered 2015-09-06 (×2): 600 mg via INTRAVENOUS
  Filled 2015-09-06 (×2): qty 50

## 2015-09-06 MED ORDER — DIPHENHYDRAMINE HCL 12.5 MG/5ML PO ELIX
12.5000 mg | ORAL_SOLUTION | ORAL | Status: DC | PRN
  Filled 2015-09-06: qty 10

## 2015-09-06 MED ORDER — ONDANSETRON HCL 4 MG/2ML IJ SOLN: 4.0000 mg | Freq: Four times a day (QID) | INTRAMUSCULAR | Status: DC | PRN

## 2015-09-06 MED ORDER — METHOCARBAMOL 1000 MG/10ML IJ SOLN
500.0000 mg | Freq: Four times a day (QID) | INTRAMUSCULAR | Status: DC | PRN
  Administered 2015-09-06 (×2): 500 mg via INTRAVENOUS
  Filled 2015-09-06 (×4): qty 5

## 2015-09-06 MED ORDER — ACETAMINOPHEN 650 MG RE SUPP: 650.0000 mg | Freq: Four times a day (QID) | RECTAL | Status: DC | PRN

## 2015-09-06 MED ORDER — SODIUM CHLORIDE 0.9 % IV SOLN
INTRAVENOUS | Status: DC
  Administered 2015-09-06 (×2): via INTRAVENOUS

## 2015-09-06 MED ORDER — CHLORHEXIDINE GLUCONATE 4 % EX LIQD: 60.0000 mL | Freq: Once | CUTANEOUS | Status: DC

## 2015-09-06 MED ORDER — HYDROMORPHONE HCL 2 MG PO TABS
2.0000 mg | ORAL_TABLET | ORAL | Status: DC | PRN
Start: 2015-09-06 — End: 2015-09-08
  Administered 2015-09-07 – 2015-09-08 (×7): 4 mg via ORAL
  Filled 2015-09-06 (×7): qty 2

## 2015-09-06 MED ORDER — PROMETHAZINE HCL 25 MG/ML IJ SOLN
12.5000 mg | Freq: Four times a day (QID) | INTRAMUSCULAR | Status: DC | PRN
  Administered 2015-09-07: 12.5 mg via INTRAVENOUS
  Filled 2015-09-06: qty 1

## 2015-09-06 MED ORDER — ONDANSETRON HCL 4 MG PO TABS: 4.0000 mg | ORAL_TABLET | Freq: Four times a day (QID) | ORAL | Status: DC | PRN

## 2015-09-06 MED ORDER — ACETAMINOPHEN 325 MG PO TABS: 650.0000 mg | ORAL_TABLET | Freq: Four times a day (QID) | ORAL | Status: DC | PRN

## 2015-09-06 MED ORDER — PANTOPRAZOLE SODIUM 40 MG PO TBEC
40.0000 mg | DELAYED_RELEASE_TABLET | Freq: Every day | ORAL | Status: DC
  Administered 2015-09-07 – 2015-09-08 (×2): 40 mg via ORAL
  Filled 2015-09-06 (×2): qty 1

## 2015-09-06 MED ORDER — BUPIVACAINE HCL (PF) 0.75 % IJ SOLN
INTRAMUSCULAR | Status: DC | PRN
  Administered 2015-09-06: 2 mL via INTRATHECAL

## 2015-09-06 MED ORDER — PHENYLEPHRINE HCL 10 MG/ML IJ SOLN
INTRAMUSCULAR | Status: DC | PRN
  Administered 2015-09-06: 40 ug via INTRAVENOUS

## 2015-09-06 MED ORDER — HYDROMORPHONE HCL 1 MG/ML IJ SOLN
1.0000 mg | INTRAMUSCULAR | Status: DC | PRN
Start: 2015-09-06 — End: 2015-09-08
  Administered 2015-09-06: 1 mg via INTRAVENOUS
  Filled 2015-09-06: qty 1

## 2015-09-06 MED ORDER — METOCLOPRAMIDE HCL 5 MG/ML IJ SOLN
5.0000 mg | Freq: Three times a day (TID) | INTRAMUSCULAR | Status: DC | PRN
  Administered 2015-09-06: 10 mg via INTRAVENOUS
  Filled 2015-09-06: qty 2

## 2015-09-06 MED ORDER — SODIUM CHLORIDE 0.9 % IJ SOLN
INTRAMUSCULAR | Status: AC
  Filled 2015-09-06: qty 50

## 2015-09-06 MED ORDER — MENTHOL 3 MG MT LOZG: 1.0000 | LOZENGE | OROMUCOSAL | Status: DC | PRN

## 2015-09-06 MED ORDER — SODIUM CHLORIDE 0.9 % IJ SOLN
INTRAMUSCULAR | Status: DC | PRN
  Administered 2015-09-06: 40 mL

## 2015-09-06 MED ORDER — MIDAZOLAM HCL 5 MG/5ML IJ SOLN
INTRAMUSCULAR | Status: DC | PRN
  Administered 2015-09-06: 2 mg via INTRAVENOUS

## 2015-09-06 SURGICAL SUPPLY — 55 items
APL SKNCLS STERI-STRIP NONHPOA (GAUZE/BANDAGES/DRESSINGS) ×1
BAG SPEC THK2 15X12 ZIP CLS (MISCELLANEOUS)
BAG ZIPLOCK 12X15 (MISCELLANEOUS) IMPLANT
BANDAGE ACE 6X5 VEL STRL LF (GAUZE/BANDAGES/DRESSINGS) ×3 IMPLANT
BENZOIN TINCTURE PRP APPL 2/3 (GAUZE/BANDAGES/DRESSINGS) ×2 IMPLANT
BLADE SAG 13.0X1.37X90 (BLADE) IMPLANT
BLADE SAG 18X100X1.27 (BLADE) IMPLANT
BOWL SMART MIX CTS (DISPOSABLE) ×3 IMPLANT
CAPT KNEE TOTAL 3 ×2 IMPLANT
CEMENT BONE 1-PACK (Cement) ×6 IMPLANT
CLOSURE WOUND 1/2 X4 (GAUZE/BANDAGES/DRESSINGS) ×1
CLOTH BEACON ORANGE TIMEOUT ST (SAFETY) ×3 IMPLANT
CUFF TOURN SGL QUICK 34 (TOURNIQUET CUFF) ×3
CUFF TRNQT CYL 34X4X40X1 (TOURNIQUET CUFF) ×1 IMPLANT
DRAPE U-SHAPE 47X51 STRL (DRAPES) ×3 IMPLANT
DRSG AQUACEL AG ADV 3.5X10 (GAUZE/BANDAGES/DRESSINGS) ×3 IMPLANT
DRSG PAD ABDOMINAL 8X10 ST (GAUZE/BANDAGES/DRESSINGS) ×3 IMPLANT
DURAPREP 26ML APPLICATOR (WOUND CARE) ×3 IMPLANT
ELECT REM PT RETURN 9FT ADLT (ELECTROSURGICAL) ×3
ELECTRODE REM PT RTRN 9FT ADLT (ELECTROSURGICAL) ×1 IMPLANT
GAUZE SPONGE 4X4 12PLY STRL (GAUZE/BANDAGES/DRESSINGS) ×3 IMPLANT
GAUZE XEROFORM 1X8 LF (GAUZE/BANDAGES/DRESSINGS) IMPLANT
GLOVE BIO SURGEON STRL SZ7.5 (GLOVE) ×5 IMPLANT
GLOVE BIOGEL PI IND STRL 7.5 (GLOVE) IMPLANT
GLOVE BIOGEL PI IND STRL 8 (GLOVE) ×2 IMPLANT
GLOVE BIOGEL PI INDICATOR 7.5 (GLOVE) ×4
GLOVE BIOGEL PI INDICATOR 8 (GLOVE) ×4
GLOVE ECLIPSE 8.0 STRL XLNG CF (GLOVE) ×3 IMPLANT
GOWN STRL REUS W/TWL XL LVL3 (GOWN DISPOSABLE) ×10 IMPLANT
HANDPIECE INTERPULSE COAX TIP (DISPOSABLE) ×3
IMMOBILIZER KNEE 20 (SOFTGOODS) ×3
IMMOBILIZER KNEE 20 THIGH 36 (SOFTGOODS) ×1 IMPLANT
INSERT TIBIAL SZ4 9MM (Orthopedic Implant) ×3 IMPLANT
NS IRRIG 1000ML POUR BTL (IV SOLUTION) ×3 IMPLANT
PACK TOTAL KNEE CUSTOM (KITS) ×3 IMPLANT
PADDING CAST COTTON 6X4 STRL (CAST SUPPLIES) ×4 IMPLANT
POSITIONER SURGICAL ARM (MISCELLANEOUS) ×3 IMPLANT
SET HNDPC FAN SPRY TIP SCT (DISPOSABLE) ×1 IMPLANT
SET PAD KNEE POSITIONER (MISCELLANEOUS) ×3 IMPLANT
STAPLER VISISTAT 35W (STAPLE) IMPLANT
STRIP CLOSURE SKIN 1/2X4 (GAUZE/BANDAGES/DRESSINGS) ×1 IMPLANT
SUCTION FRAZIER HANDLE 12FR (TUBING) ×2
SUCTION TUBE FRAZIER 12FR DISP (TUBING) ×1 IMPLANT
SUT MNCRL AB 4-0 PS2 18 (SUTURE) ×2 IMPLANT
SUT VIC AB 0 CT1 27 (SUTURE) ×3
SUT VIC AB 0 CT1 27XBRD ANTBC (SUTURE) ×1 IMPLANT
SUT VIC AB 1 CT1 27 (SUTURE) ×6
SUT VIC AB 1 CT1 27XBRD ANTBC (SUTURE) ×2 IMPLANT
SUT VIC AB 2-0 CT1 27 (SUTURE) ×6
SUT VIC AB 2-0 CT1 TAPERPNT 27 (SUTURE) ×2 IMPLANT
TRAY FOLEY W/METER SILVER 14FR (SET/KITS/TRAYS/PACK) ×3 IMPLANT
TRAY FOLEY W/METER SILVER 16FR (SET/KITS/TRAYS/PACK) ×1 IMPLANT
WATER STERILE IRR 1500ML POUR (IV SOLUTION) ×3 IMPLANT
WRAP KNEE MAXI GEL POST OP (GAUZE/BANDAGES/DRESSINGS) ×3 IMPLANT
YANKAUER SUCT BULB TIP 10FT TU (MISCELLANEOUS) ×3 IMPLANT

## 2015-09-06 NOTE — H&P (Signed)
TOTAL KNEE ADMISSION H&P  Patient is being admitted for right total knee arthroplasty.  Subjective:  Chief Complaint:right knee pain.  HPI: Chelsea Wheeler, 55 y.o. female, has a history of pain and functional disability in the right knee due to arthritis and has failed non-surgical conservative treatments for greater than 12 weeks to includeNSAID's and/or analgesics, corticosteriod injections, viscosupplementation injections, flexibility and strengthening excercises and activity modification.  Onset of symptoms was gradual, starting 3 years ago with gradually worsening course since that time. The patient noted prior procedures on the knee to include  arthroscopy on the right knee(s).  Patient currently rates pain in the right knee(s) at 10 out of 10 with activity. Patient has night pain, worsening of pain with activity and weight bearing, pain that interferes with activities of daily living, pain with passive range of motion, crepitus and joint swelling.  Patient has evidence of subchondral sclerosis, periarticular osteophytes and joint space narrowing by imaging studies. There is no active infection.  Patient Active Problem List   Diagnosis Date Noted  . Osteoarthritis of right knee 09/06/2015  . Cough 09/14/2012   Past Medical History  Diagnosis Date  . Seasonal allergies   . Hypertension   . Arthritis     knee  . Claustrophobia   . GERD (gastroesophageal reflux disease)     Past Surgical History  Procedure Laterality Date  . Tubal ligation  08-31-06  . Endometrial ablation  2007  . Knee arthroscopy Right 03/22/2015    Procedure: ARTHROSCOPY KNEE, partial lateral meniscectomy, microfracture, chondraplasty;  Surgeon: Erin SonsHarold Kernodle, MD;  Location: [facility] ORS;  Service: Orthopedics;  Laterality: Right;    Prescriptions prior to admission  Medication Sig Dispense Refill Last Dose  . cetirizine (ZYRTEC) 10 MG tablet Take 10 mg by mouth daily.    09/06/2015 at 0430  . Cholecalciferol  (VITAMIN D3 PO) Take 1 tablet by mouth daily.    09/05/2015 at Unknown time  . EPINEPHrine (EPIPEN 2-PAK) 0.3 mg/0.3 mL IJ SOAJ injection Inject 0.3 mg into the muscle once.    Completed Course at Unknown time  . esomeprazole (NEXIUM) 40 MG capsule Take 40 mg by mouth every morning.    09/06/2015 at 0430  . fluticasone (FLONASE) 50 MCG/ACT nasal spray Place 1 spray into both nostrils daily as needed for allergies or rhinitis.    09/06/2015 at 0430  . hydrochlorothiazide (HYDRODIURIL) 25 MG tablet Take 25 mg by mouth every morning.    09/05/2015 at am  . meloxicam (MOBIC) 7.5 MG tablet Take 7.5 mg by mouth 2 (two) times daily as needed for pain.   Past Week at Unknown time  . Multiple Vitamins-Minerals (ANTIOXIDANT FORMULA SG) capsule Take 1 capsule by mouth daily.   09/05/2015 at Unknown time  . potassium chloride (K-DUR) 10 MEQ tablet Take 10 mEq by mouth 2 (two) times daily.   09/05/2015 at Unknown time  . PRESCRIPTION MEDICATION Inject 1 cartridge into the skin 2 (two) times a week. ALLERGY SHOT Mondays and Thursdays   Past Week at Unknown time  . zolpidem (AMBIEN CR) 12.5 MG CR tablet Take 12.5 mg by mouth at bedtime as needed for sleep.   Past Week at Unknown time  . diazepam (VALIUM) 5 MG tablet Take 1 tablet (5 mg total) by mouth 2 (two) times daily. (Patient not taking: Reported on 08/28/2015) 6 tablet 0   . HYDROcodone-acetaminophen (NORCO/VICODIN) 5-325 MG per tablet Take 1 tablet by mouth every 6 (six) hours as needed for  moderate pain. (Patient not taking: Reported on 08/28/2015) 20 tablet 0   . NORCO 5-325 MG per tablet Take 1-2 tablets by mouth every 6 (six) hours as needed for moderate pain. MAXIMUM TOTAL ACETAMINOPHEN DOSE IS 4000 MG PER DAY (Patient not taking: Reported on 08/28/2015) 25 tablet 0   . traMADol (ULTRAM) 50 MG tablet Take 50 mg by mouth every 6 (six) hours as needed for moderate pain. Not taking per patient   Past Week at Unknown time   Allergies  Allergen Reactions  . Shellfish  Allergy Anaphylaxis  . Tylenol With Codeine #3 [Acetaminophen-Codeine] Other (See Comments)    "MADE ME FEEL HORRIBLE"  . Lodine [Etodolac] Rash  . Naproxen Rash  . Penicillins Rash    .Marland KitchenHas patient had a PCN reaction causing immediate rash, facial/tongue/throat swelling, SOB or lightheadedness with hypotension: No Has patient had a PCN reaction causing severe rash involving mucus membranes or skin necrosis: No Has patient had a PCN reaction that required hospitalization No Has patient had a PCN reaction occurring within the last 10 years: No If all of the above answers are "NO", then may proceed with Cephalosporin use.      Social History  Substance Use Topics  . Smoking status: Never Smoker   . Smokeless tobacco: Never Used  . Alcohol Use: No    Family History  Problem Relation Age of Onset  . Allergies Sister      Review of Systems  Musculoskeletal: Positive for joint pain.  All other systems reviewed and are negative.   Objective:  Physical Exam  Constitutional: She is oriented to person, place, and time. She appears well-developed and well-nourished.  HENT:  Head: Normocephalic and atraumatic.  Eyes: EOM are normal. Pupils are equal, round, and reactive to light.  Neck: Normal range of motion. Neck supple.  Cardiovascular: Normal rate and regular rhythm.   Respiratory: Effort normal and breath sounds normal.  GI: Soft. Bowel sounds are normal.  Musculoskeletal:       Right knee: She exhibits decreased range of motion, swelling and effusion. Tenderness found. Medial joint line and lateral joint line tenderness noted.  Neurological: She is alert and oriented to person, place, and time.  Skin: Skin is warm and dry.  Psychiatric: She has a normal mood and affect.    Vital signs in last 24 hours: Temp:  [97.7 F (36.5 C)] 97.7 F (36.5 C) (03/10 0538) Pulse Rate:  [78] 78 (03/10 0538) Resp:  [18] 18 (03/10 0538) BP: (125)/(95) 125/95 mmHg (03/10 0538) SpO2:  [98  %] 98 % (03/10 0538) Weight:  [82.555 kg (182 lb)] 82.555 kg (182 lb) (03/10 0547)  Labs:   Estimated body mass index is 26.86 kg/(m^2) as calculated from the following:   Height as of this encounter:  (1.753 m).   Weight as of this encounter: 82.555 kg (182 lb).   Imaging Review Plain radiographs demonstrate moderate degenerative joint disease of the right knee(s). The overall alignment isneutral. The bone quality appears to be excellent for age and reported activity level.  Assessment/Plan:  End stage arthritis, right knee   The patient history, physical examination, clinical judgment of the provider and imaging studies are consistent with end stage degenerative joint disease of the right knee(s) and total knee arthroplasty is deemed medically necessary. The treatment options including medical management, injection therapy arthroscopy and arthroplasty were discussed at length. The risks and benefits of total knee arthroplasty were presented and reviewed. The risks due to  aseptic loosening, infection, stiffness, patella tracking problems, thromboembolic complications and other imponderables were discussed. The patient acknowledged the explanation, agreed to proceed with the plan and consent was signed. Patient is being admitted for inpatient treatment for surgery, pain control, PT, OT, prophylactic antibiotics, VTE prophylaxis, progressive ambulation and ADL's and discharge planning. The patient is planning to be discharged home with home health services

## 2015-09-06 NOTE — Anesthesia Postprocedure Evaluation (Signed)
Anesthesia Post Note  Patient: Lenox AhrBridget M Moffitt  Procedure(s) Performed: Procedure(s) (LRB): RIGHT TOTAL KNEE ARTHROPLASTY (Right)  Patient location during evaluation: PACU Anesthesia Type: Spinal Level of consciousness: awake and alert Pain management: pain level controlled Vital Signs Assessment: post-procedure vital signs reviewed and stable Respiratory status: spontaneous breathing, nonlabored ventilation, respiratory function stable and patient connected to nasal cannula oxygen Cardiovascular status: blood pressure returned to baseline and stable Postop Assessment: no signs of nausea or vomiting Anesthetic complications: no    Last Vitals:  Filed Vitals:   09/06/15 1015 09/06/15 1030  BP: 117/76   Pulse: 60 69  Temp:  36.4 C  Resp: 13 19    Last Pain:  Filed Vitals:   09/06/15 1031  PainSc: 7                  Camara Rosander JENNETTE

## 2015-09-06 NOTE — Anesthesia Preprocedure Evaluation (Addendum)
Anesthesia Evaluation  Patient identified by MRN, date of birth, ID band Patient awake    Reviewed: Allergy & Precautions, NPO status , Patient's Chart, lab work & pertinent test results  History of Anesthesia Complications Negative for: history of anesthetic complications  Airway Mallampati: II  TM Distance: >3 FB Neck ROM: Full    Dental no notable dental hx. (+) Dental Advisory Given   Pulmonary neg pulmonary ROS,    Pulmonary exam normal breath sounds clear to auscultation       Cardiovascular hypertension, Pt. on medications Normal cardiovascular exam Rhythm:Regular Rate:Normal     Neuro/Psych PSYCHIATRIC DISORDERS Anxiety negative neurological ROS     GI/Hepatic Neg liver ROS, GERD  Medicated and Controlled,  Endo/Other  negative endocrine ROS  Renal/GU negative Renal ROS  negative genitourinary   Musculoskeletal  (+) Arthritis ,   Abdominal   Peds negative pediatric ROS (+)  Hematology negative hematology ROS (+)   Anesthesia Other Findings   Reproductive/Obstetrics negative OB ROS                            Anesthesia Physical Anesthesia Plan  ASA: II  Anesthesia Plan: Spinal   Post-op Pain Management:    Induction: Intravenous  Airway Management Planned:   Additional Equipment:   Intra-op Plan:   Post-operative Plan:   Informed Consent: I have reviewed the patients History and Physical, chart, labs and discussed the procedure including the risks, benefits and alternatives for the proposed anesthesia with the patient or authorized representative who has indicated his/her understanding and acceptance.   Dental advisory given  Plan Discussed with: CRNA  Anesthesia Plan Comments:        Anesthesia Quick Evaluation

## 2015-09-06 NOTE — Anesthesia Procedure Notes (Signed)
Spinal  Start time: 09/06/2015 7:15 AM End time: 09/06/2015 7:21 AM Staffing Resident/CRNA: Mirian MoARVER, Aycen Porreca J Performed by: resident/CRNA  Preanesthetic Checklist Completed: patient identified, site marked, surgical consent, pre-op evaluation, timeout performed, IV checked, risks and benefits discussed and monitors and equipment checked Spinal Block Patient position: sitting Prep: ChloraPrep Patient monitoring: heart rate, continuous pulse ox and blood pressure Approach: midline Location: L4-5 Injection technique: single-shot Needle Needle type: Sprotte  Needle gauge: 24 G Needle length: 9 cm Needle insertion depth: 7 cm

## 2015-09-06 NOTE — Op Note (Signed)
Chelsea Wheeler, Chelsea Wheeler             ACCOUNT NO.:  [id number]  MEDICAL RECORD NO.:  [id number]  LOCATION:                               [facility]  PHYSICIAN:  Vanita PandaChristopher Y. Magnus IvanBlackman, M.D.DATE OF BIRTH:  12/05/1960  DATE OF PROCEDURE:  09/06/2015 DATE OF DISCHARGE:                              OPERATIVE REPORT   PREOPERATIVE DIAGNOSIS:  Primary osteoarthritis and degenerative joint disease, right knee.  POSTOPERATIVE DIAGNOSIS:  Primary osteoarthritis and degenerative joint disease, right knee.  PROCEDURE:  Right total knee arthroplasty.  IMPLANTS:  [employer]ker Triathlon knee with size 3 femur, size 4 universal base plate tibial tray, 9 mm constrained polyethylene insert, size 29 central patellar button.  SURGEON:  Kathryne Hitchhristopher Y Lucynda Rosano MD  ASSISTANT:  Richardean CanalGilbert Clark, PA-C  ANESTHESIA: 1. Spinal. 2. Local with Exparel intracapsular injection.  TOURNIQUET TIME:  Less than 1 hour.  BLOOD LOSS:  Less than 200 mL.  COMPLICATIONS:  None.  INDICATIONS:  Ms. Chelsea Wheeler is an only 55 year old individual, but she has debilitating osteoarthritis of her right knee.  She has had arthroscopic intervention of that knee in [place] and her x-rays do show periarticular osteophytes, joint space narrowing, and sclerotic changes in her knee.  Her pain is daily.  It detrimentally affects her activities of daily living, her quality of life and her mobility.  She has tried anti-inflammatories, rest, ice, heat, quad strengthening exercises, steroid injections, and Visco supplementation injections, and none of this has worked.  At this point, she wishes to proceed with a total knee arthroplasty.  She understands the risk of acute blood loss anemia, severe pain, nerve and vessel injury, fracture, infection, and DVT.  She understands our goals are decreased pain, improved mobility, and overall improved quality of life.  PROCEDURE DESCRIPTION:  After informed consent was obtained,  appropriate right knee was marked.  She was brought to the operating room.  Spinal anesthesia was obtained.  She was then laid supine on the operating table.  A Foley catheter was placed and nonsterile tourniquet was placed around her upper right leg.  Her right leg was then prepped and draped from the thigh down the ankle with DuraPrep and sterile drapes including a sterile stockinette.  Time-out was called and she was identified as correct patient and correct right knee.  We then made an incision over the patella and carried this proximally and distally.  We dissected down the knee joint and carried out a medial parapatellar arthrotomy finding a large joint effusion.  Once we opened the knee, we could see that there were areas of full-thickness cartilage loss throughout the knee which was quite satisfying.  We removed remnants of the ACL, PCL, medial and lateral meniscus.  The popliteus was then literally cut so __________ insert with the universal base plate.  We started on the tibia side using the extramedullary cutting guide for the tibia.  We set it to take 9 mm off the high side correcting for a neutral slope and neutral varus and valgus.  We made this cut without difficulty.  We then went to the femur side and set our distal femoral cut for 10 mm using an intramedullary guide and setting this for  5 degrees externally rotated for a right knee.  We made the distal femoral cut without difficulty and we brought the knee back down in extension and a 9 mm extension block showed full extension.  We then removed all pins and went back to the femur.  We put our femoral sizing guide based off the epicondylar axis, Whiteside's line and 3 degrees externally rotated.  We chose a size 3 femur.  We put our 4 in 1 cutting block for a size 3 femur and made our anterior and posterior cuts followed by our chamfer cuts.  We then made our femoral box cut and went back to the tibia.  We then set our  tibial rotation off the femur and the tibial tubercle.  We did our keel punch and drill for universal base plate.  With the trial size 4 tibia and the size 3 femur, we then trialed a 9 mm polyethylene insert and I was pleased with the range of motion and stability especially at the posterior lateral corner.  We then made our patellar cut and drilled 3 holes for patellar button.  We removed all instrumentation and irrigated the knee with normal saline solution using pulsatile lavage.  We used electrocautery around the knee and then inserted our mixture of 20 mL of Exparel diluted with 40 mL of normal saline throughout the joint capsule.  We then mixed our cement and with the knee in a flexed position, cemented the real size 4 universal base plate from [employer] followed by the real size 3 femur.  We placed our constrained 9 mm polyethylene insert and cemented our patellar button.  Once the cement had hardened, we removed cement debris from the knee, irrigated the knee again with normal saline solution, let the tourniquet down, and hemostasis was obtained with electrocautery.  We then placed the knee in extended position and closed the arthrotomy with interrupted #1 Vicryl suture followed by 0 Vicryl in the deep tissue, 2-0 Vicryl in the subcutaneous tissue, 4-0 Monocryl subcuticular stitch, and Steri-Strips on the skin.  Well-padded sterile dressing was applied and she was taken to the recovery room in stable condition.  All final counts were correct.  There were no complications noted.  Of note, Richardean Canal, PA- C assisted in the entire case.  His assistance was crucial for facilitating all aspects of this case.     Vanita Panda. Magnus Ivan, M.D.     CYB/MEDQ  D:  09/06/2015  T:  09/06/2015  Job:  [id number]

## 2015-09-06 NOTE — Progress Notes (Signed)
Utilization review completed.  

## 2015-09-06 NOTE — Evaluation (Signed)
Physical Therapy Evaluation Patient Details Name: Chelsea Wheeler MRN: [id number] DOB: [birth year] Today's Date: 09/06/2015   History of Present Illness  R TKR  Clinical Impression  Pt s/p R TKR presents with decreased R LE strength/ROM and post op pain limiting functional mobility.  Pt should progress to dc home with family assist and HHPT follow up.    Follow Up Recommendations Home health PT    Equipment Recommendations  None recommended by PT    Recommendations for Other Services OT consult     Precautions / Restrictions Precautions Precautions: Knee;Fall Required Braces or Orthoses: Knee Immobilizer - Right Knee Immobilizer - Right: Discontinue once straight leg raise with < 10 degree lag Restrictions Weight Bearing Restrictions: No Other Position/Activity Restrictions: WBAT      Mobility  Bed Mobility Overal bed mobility: Needs Assistance Bed Mobility: Supine to Sit     Supine to sit: Min assist     General bed mobility comments: cues for sequence and use of L LE to self assist  Transfers Overall transfer level: Needs assistance Equipment used: Rolling walker (2 wheeled) Transfers: Sit to/from Stand Sit to Stand: Min assist         General transfer comment: cues for LE management and use of UEs to self assist  Ambulation/Gait Ambulation/Gait assistance: Min assist Ambulation Distance (Feet): 35 Feet Assistive device: Rolling walker (2 wheeled) Gait Pattern/deviations: Step-to pattern;Decreased step length - right;Decreased step length - left;Shuffle;Trunk flexed Gait velocity: decr Gait velocity interpretation: Below normal speed for age/gender General Gait Details: cues for sequence, posture and position from [employer]            Wheelchair Mobility    Modified Rankin (Stroke Patients Only)       Balance                                             Pertinent Vitals/Pain Pain Assessment: 0-10 Pain Score: 6   Pain Location: R knee Pain Descriptors / Indicators: Aching;Sore Pain Intervention(s): Limited activity within patient's tolerance;Monitored during session;Premedicated before session;Ice applied    Home Living Family/patient expects to be discharged to:: Private residence Living Arrangements: Spouse/significant other Available Help at Discharge: Family Type of Home: House Home Access: Level entry     Home Layout: Able to live on main level with bedroom/bathroom Home Equipment: Dan Humphreys - 2 wheels      Prior Function Level of Independence: Independent               Hand Dominance        Extremity/Trunk Assessment   Upper Extremity Assessment: Overall WFL for tasks assessed           Lower Extremity Assessment: RLE deficits/detail      Cervical / Trunk Assessment: Normal  Communication   Communication: No difficulties  Cognition Arousal/Alertness: Awake/alert Behavior During Therapy: WFL for tasks assessed/performed Overall Cognitive Status: Within Functional Limits for tasks assessed                      General Comments      Exercises Total Joint Exercises Ankle Circles/Pumps: AROM;Both;15 reps;Supine      Assessment/Plan    PT Assessment Patient needs continued PT services  PT Diagnosis Difficulty walking   PT Problem List Decreased strength;Decreased range of motion;Decreased activity tolerance;Decreased mobility;Decreased knowledge of use of DME;Pain  PT Treatment Interventions DME instruction;Gait training;Stair training;Functional mobility training;Therapeutic activities;Therapeutic exercise;Patient/family education   PT Goals (Current goals can be found in the Care Plan section) Acute Rehab PT Goals Patient Stated Goal: Regain IND PT Goal Formulation: With patient Time For Goal Achievement: 09/11/15 Potential to Achieve Goals: Good    Frequency 7X/week   Barriers to discharge        Co-evaluation               End  of Session Equipment Utilized During Treatment: Gait belt;Right knee immobilizer Activity Tolerance: Patient tolerated treatment well Patient left: in chair;with call bell/phone within reach;with family/visitor present Nurse Communication: Mobility status         Time: [id number] PT Time Calculation (min) (ACUTE ONLY): 34 min   Charges:   PT Evaluation $PT Eval Low Complexity: 1 Procedure PT Treatments $Gait Training: 8-22 mins   PT G Codes:        Lonza Shimabukuro 09/06/2015, 6:37 PM

## 2015-09-06 NOTE — Brief Op Note (Signed)
09/06/2015  8:50 AM  PATIENT:  Chelsea Wheeler  55 y.o. female  PRE-OPERATIVE DIAGNOSIS:  osteoarthritis right knee  POST-OPERATIVE DIAGNOSIS:  osteoarthritis right knee  PROCEDURE:  Procedure(s): RIGHT TOTAL KNEE ARTHROPLASTY (Right)  SURGEON:  Surgeon(s) and Role:    * Kathryne Hitchhristopher Y Nikeya Maxim, MD - Primary  PHYSICIAN ASSISTANT: Rexene EdisonGil Clark, PA-C  ANESTHESIA:   local and spinal  EBL:  Total I/O In: 1000 [I.V.:1000] Out: 75 [Urine:50; Blood:25]  LOCAL MEDICATIONS USED:  OTHER Experil  COUNTS:  YES  TOURNIQUET:   Total Tourniquet Time Documented: Thigh (Right) - 57 minutes Total: Thigh (Right) - 57 minutes   DICTATION: .Other Dictation: Dictation Number [id number]  PLAN OF CARE: Admit to inpatient   PATIENT DISPOSITION:  PACU - hemodynamically stable.   Delay start of Pharmacological VTE agent (>24hrs) due to surgical blood loss or risk of bleeding: no

## 2015-09-06 NOTE — Transfer of Care (Signed)
Immediate Anesthesia Transfer of Care Note  Patient: Chelsea Wheeler  Procedure(s) Performed: Procedure(s): RIGHT TOTAL KNEE ARTHROPLASTY (Right)  Patient Location: PACU  Anesthesia Type:Spinal  Level of Consciousness: awake, alert  and oriented  Airway & Oxygen Therapy: Patient Spontanous Breathing and Patient connected to face mask oxygen  Post-op Assessment: Report given to RN and Post -op Vital signs reviewed and stable  Post vital signs: Reviewed and stable  Last Vitals:  Filed Vitals:   09/06/15 0538  BP: 125/95  Pulse: 78  Temp: 36.5 C  Resp: 18    Complications: No apparent anesthesia complications

## 2015-09-06 NOTE — Op Note (Deleted)
NAME:  Wheeler, Chelsea             ACCOUNT NO.:  [id number]  MEDICAL RECORD NO.:  [id number]  LOCATION:                               FACILITY:  [facility]  PHYSICIAN:  Kilie Rund Y. Decarlos Empey, M.D.DATE OF BIRTH:  07/11/1960  DATE OF PROCEDURE:  09/06/2015 DATE OF DISCHARGE:                              OPERATIVE REPORT   PREOPERATIVE DIAGNOSIS:  Primary osteoarthritis and degenerative joint disease, right knee.  POSTOPERATIVE DIAGNOSIS:  Primary osteoarthritis and degenerative joint disease, right knee.  PROCEDURE:  Right total knee arthroplasty.  IMPLANTS:  [employer] Triathlon knee with size 3 femur, size 4 universal base plate tibial tray, 9 mm constrained polyethylene insert, size 29 central patellar button.  SURGEON:  Katrianna Friesenhahn Y Almeter Westhoff MD  ASSISTANT:  Gilbert Clark, PA-C  ANESTHESIA: 1. Spinal. 2. Local with Exparel intracapsular injection.  TOURNIQUET TIME:  Less than 1 hour.  BLOOD LOSS:  Less than 200 mL.  COMPLICATIONS:  None.  INDICATIONS:  Chelsea Wheeler is an only 54-year-old individual, but she has debilitating osteoarthritis of her right knee.  She has had arthroscopic intervention of that knee in  and her x-rays do show periarticular osteophytes, joint space narrowing, and sclerotic changes in her knee.  Her pain is daily.  It detrimentally affects her activities of daily living, her quality of life and her mobility.  She has tried anti-inflammatories, rest, ice, heat, quad strengthening exercises, steroid injections, and Visco supplementation injections, and none of this has worked.  At this point, she wishes to proceed with a total knee arthroplasty.  She understands the risk of acute blood loss anemia, severe pain, nerve and vessel injury, fracture, infection, and DVT.  She understands our goals are decreased pain, improved mobility, and overall improved quality of life.  PROCEDURE DESCRIPTION:  After informed consent was obtained,  appropriate right knee was marked.  She was brought to the operating room.  Spinal anesthesia was obtained.  She was then laid supine on the operating table.  A Foley catheter was placed and nonsterile tourniquet was placed around her upper right leg.  Her right leg was then prepped and draped from the thigh down the ankle with DuraPrep and sterile drapes including a sterile stockinette.  Time-out was called and she was identified as correct patient and correct right knee.  We then made an incision over the patella and carried this proximally and distally.  We dissected down the knee joint and carried out a medial parapatellar arthrotomy finding a large joint effusion.  Once we opened the knee, we could see that there were areas of full-thickness cartilage loss throughout the knee which was quite satisfying.  We removed remnants of the ACL, PCL, medial and lateral meniscus.  The popliteus was then literally cut so __________ insert with the universal base plate.  We started on the tibia side using the extramedullary cutting guide for the tibia.  We set it to take 9 mm off the high side correcting for a neutral slope and neutral varus and valgus.  We made this cut without difficulty.  We then went to the femur side and set our distal femoral cut for 10 mm using an intramedullary guide and setting this for   5 degrees externally rotated for a right knee.  We made the distal femoral cut without difficulty and we brought the knee back down in extension and a 9 mm extension block showed full extension.  We then removed all pins and went back to the femur.  We put our femoral sizing guide based off the epicondylar axis, Whiteside's line and 3 degrees externally rotated.  We chose a size 3 femur.  We put our 4 in 1 cutting block for a size 3 femur and made our anterior and posterior cuts followed by our chamfer cuts.  We then made our femoral box cut and went back to the tibia.  We then set our  tibial rotation off the femur and the tibial tubercle.  We did our keel punch and drill for universal base plate.  With the trial size 4 tibia and the size 3 femur, we then trialed a 9 mm polyethylene insert and I was pleased with the range of motion and stability especially at the posterior lateral corner.  We then made our patellar cut and drilled 3 holes for patellar button.  We removed all instrumentation and irrigated the knee with normal saline solution using pulsatile lavage.  We used electrocautery around the knee and then inserted our mixture of 20 mL of Exparel diluted with 40 mL of normal saline throughout the joint capsule.  We then mixed our cement and with the knee in a flexed position, cemented the real size 4 universal base plate from [employer] followed by the real size 3 femur.  We placed our constrained 9 mm polyethylene insert and cemented our patellar button.  Once the cement had hardened, we removed cement debris from the knee, irrigated the knee again with normal saline solution, let the tourniquet down, and hemostasis was obtained with electrocautery.  We then placed the knee in extended position and closed the arthrotomy with interrupted #1 Vicryl suture followed by 0 Vicryl in the deep tissue, 2-0 Vicryl in the subcutaneous tissue, 4-0 Monocryl subcuticular stitch, and Steri-Strips on the skin.  Well-padded sterile dressing was applied and she was taken to the recovery room in stable condition.  All final counts were correct.  There were no complications noted.  Of note, Gilbert Clark, PA- C assisted in the entire case.  His assistance was crucial for facilitating all aspects of this case.     Izaac Reisig Y. Rambo Sarafian, M.D.     CYB/MEDQ  D:  09/06/2015  T:  09/06/2015  Job:  [id number] 

## 2015-09-07 LAB — CBC
HEMATOCRIT: 28 % — AB (ref 36.0–46.0)
HEMOGLOBIN: 9.4 g/dL — AB (ref 12.0–15.0)
MCH: 29.9 pg (ref 26.0–34.0)
MCHC: 33.6 g/dL (ref 30.0–36.0)
MCV: 89.2 fL (ref 78.0–100.0)
Platelets: 137 10*3/uL — ABNORMAL LOW (ref 150–400)
RBC: 3.14 MIL/uL — AB (ref 3.87–5.11)
RDW: 12.9 % (ref 11.5–15.5)
WBC: 8.7 10*3/uL (ref 4.0–10.5)

## 2015-09-07 LAB — BASIC METABOLIC PANEL
Anion gap: 7 (ref 5–15)
BUN: 7 mg/dL (ref 6–20)
CHLORIDE: 104 mmol/L (ref 101–111)
CO2: 26 mmol/L (ref 22–32)
CREATININE: 0.74 mg/dL (ref 0.44–1.00)
Calcium: 8.7 mg/dL — ABNORMAL LOW (ref 8.9–10.3)
GFR calc non Af Amer: >60 mL/min (ref >60)
Glucose, Bld: 140 mg/dL — ABNORMAL HIGH (ref 65–99)
POTASSIUM: 2.9 mmol/L — AB (ref 3.5–5.1)
SODIUM: 137 mmol/L (ref 135–145)

## 2015-09-07 MED ORDER — POTASSIUM CHLORIDE CRYS ER 20 MEQ PO TBCR
40.0000 meq | EXTENDED_RELEASE_TABLET | Freq: Once | ORAL | Status: AC
  Administered 2015-09-07: 40 meq via ORAL
  Filled 2015-09-07: qty 2

## 2015-09-07 MED ORDER — POTASSIUM CHLORIDE CRYS ER 20 MEQ PO TBCR: 40.0000 meq | EXTENDED_RELEASE_TABLET | Freq: Once | ORAL | Status: DC

## 2015-09-07 NOTE — Progress Notes (Signed)
Physical Therapy Treatment Patient Details Name: Chelsea Wheeler MRN: [id number] DOB: January 11, 1961 Today's Date: 09/07/2015    History of Present Illness R TKR    PT Comments    Pt limited this am by fatigue and nausea but agreeable to therex in bed.  Follow Up Recommendations  Home health PT     Equipment Recommendations  None recommended by PT    Recommendations for Other Services OT consult     Precautions / Restrictions Precautions Precautions: Knee;Fall Required Braces or Orthoses: Knee Immobilizer - Right Knee Immobilizer - Right: Discontinue once straight leg raise with < 10 degree lag Restrictions Weight Bearing Restrictions: No Other Position/Activity Restrictions: WBAT    Mobility  Bed Mobility               General bed mobility comments: NT - pt ltd by fatigue and ongoing nausea but agreeable to therex  Transfers                    Ambulation/Gait                 Stairs            Wheelchair Mobility    Modified Rankin (Stroke Patients Only)       Balance                                    Cognition Arousal/Alertness: Awake/alert Behavior During Therapy: WFL for tasks assessed/performed Overall Cognitive Status: Within Functional Limits for tasks assessed                      Exercises Total Joint Exercises Ankle Circles/Pumps: AROM;Both;15 reps;Supine Quad Sets: AROM;Both;10 reps;Supine Heel Slides: AAROM;Right;15 reps;Supine Hip ABduction/ADduction: AAROM;Right;15 reps;Supine Goniometric ROM: AAROM at R knee -10 - 35 with muscle guarding    General Comments        Pertinent Vitals/Pain Pain Assessment: 0-10 Pain Score: 5  Pain Location: R knee Pain Descriptors / Indicators: Aching;Sore Pain Intervention(s): Limited activity within patient's tolerance;Monitored during session;Premedicated before session;Ice applied    Home Living                      Prior Function             PT Goals (current goals can now be found in the care plan section) Acute Rehab PT Goals Patient Stated Goal: Regain IND PT Goal Formulation: With patient Time For Goal Achievement: 09/11/15 Potential to Achieve Goals: Good Progress towards PT goals: Progressing toward goals    Frequency  7X/week    PT Plan Current plan remains appropriate    Co-evaluation             End of Session   Activity Tolerance: Patient limited by fatigue;Other (comment) (nausea) Patient left: in bed;with call bell/phone within reach     Time: 1045-1108 PT Time Calculation (min) (ACUTE ONLY): 23 min  Charges:  $Therapeutic Exercise: 23-37 mins                    G Codes:      Chelsea Wheeler 09/07/2015, 1:07 PM

## 2015-09-07 NOTE — Discharge Instructions (Addendum)
Information on my medicine - XARELTO® (Rivaroxaban) ° °This medication education was reviewed with me or my healthcare representative as part of my discharge preparation.  The pharmacist that spoke with me during my hospital stay was:  Jackson, Rachel E, RPH ° °Why was Xarelto® prescribed for you? °Xarelto® was prescribed for you to reduce the risk of blood clots forming after orthopedic surgery. The medical term for these abnormal blood clots is venous thromboembolism (VTE). ° °What do you need to know about xarelto® ? °Take your Xarelto® ONCE DAILY at the same time every day. °You may take it either with or without food. ° °If you have difficulty swallowing the tablet whole, you may crush it and mix in applesauce just prior to taking your dose. ° °Take Xarelto® exactly as prescribed by your doctor and DO NOT stop taking Xarelto® without talking to the doctor who prescribed the medication.  Stopping without other VTE prevention medication to take the place of Xarelto® may increase your risk of developing a clot. ° °After discharge, you should have regular check-up appointments with your healthcare provider that is prescribing your Xarelto®.   ° °What do you do if you miss a dose? °If you miss a dose, take it as soon as you remember on the same day then continue your regularly scheduled once daily regimen the next day. Do not take two doses of Xarelto® on the same day.  ° °Important Safety Information °A possible side effect of Xarelto® is bleeding. You should call your healthcare provider right away if you experience any of the following: °? Bleeding from an injury or your nose that does not stop. °? Unusual colored urine (red or dark brown) or unusual colored stools (red or black). °? Unusual bruising for unknown reasons. °? A serious fall or if you hit your head (even if there is no bleeding). ° °Some medicines may interact with Xarelto® and might increase your risk of bleeding while on Xarelto®. To help avoid  this, consult your healthcare provider or pharmacist prior to using any new prescription or non-prescription medications, including herbals, vitamins, non-steroidal anti-inflammatory drugs (NSAIDs) and supplements. ° °This website has more information on Xarelto®: www.xarelto.com. ° °INSTRUCTIONS AFTER JOINT REPLACEMENT  ° °o Remove items at home which could result in a fall. This includes throw rugs or furniture in walking pathways °o ICE to the affected joint every three hours while awake for 30 minutes at a time, for at least the first 3-5 days, and then as needed for pain and swelling.  Continue to use ice for pain and swelling. You may notice swelling that will progress down to the foot and ankle.  This is normal after surgery.  Elevate your leg when you are not up walking on it.   °o Continue to use the breathing machine you got in the hospital (incentive spirometer) which will help keep your temperature down.  It is common for your temperature to cycle up and down following surgery, especially at night when you are not up moving around and exerting yourself.  The breathing machine keeps your lungs expanded and your temperature down. ° ° °DIET:  As you were doing prior to hospitalization, we recommend a well-balanced diet. ° °DRESSING / WOUND CARE / SHOWERING ° °Keep the surgical dressing until follow up.  The dressing is water proof, so you can shower without any extra covering.  IF THE DRESSING FALLS OFF or the wound gets wet inside, change the dressing with sterile gauze.    Please use good hand washing techniques before changing the dressing.  Do not use any lotions or creams on the incision until instructed by your surgeon.   ° °ACTIVITY ° °o Increase activity slowly as tolerated, but follow the weight bearing instructions below.   °o No driving for 6 weeks or until further direction given by your physician.  You cannot drive while taking narcotics.  °o No lifting or carrying greater than 10 lbs. until  further directed by your surgeon. °o Avoid periods of inactivity such as sitting longer than an hour when not asleep. This helps prevent blood clots.  °o You may return to work once you are authorized by your doctor.  ° ° ° °WEIGHT BEARING  ° °Weight bearing as tolerated with assist device (walker, cane, etc) as directed, use it as long as suggested by your surgeon or therapist, typically at least 4-6 weeks. ° ° °EXERCISES ° °Results after joint replacement surgery are often greatly improved when you follow the exercise, range of motion and muscle strengthening exercises prescribed by your doctor. Safety measures are also important to protect the joint from further injury. Any time any of these exercises cause you to have increased pain or swelling, decrease what you are doing until you are comfortable again and then slowly increase them. If you have problems or questions, call your caregiver or physical therapist for advice.  ° °Rehabilitation is important following a joint replacement. After just a few days of immobilization, the muscles of the leg can become weakened and shrink (atrophy).  These exercises are designed to build up the tone and strength of the thigh and leg muscles and to improve motion. Often times heat used for twenty to thirty minutes before working out will loosen up your tissues and help with improving the range of motion but do not use heat for the first two weeks following surgery (sometimes heat can increase post-operative swelling).  ° °These exercises can be done on a training (exercise) mat, on the floor, on a table or on a bed. Use whatever works the best and is most comfortable for you.    Use music or television while you are exercising so that the exercises are a pleasant break in your day. This will make your life better with the exercises acting as a break in your routine that you can look forward to.   Perform all exercises about fifteen times, three times per day or as directed.   You should exercise both the operative leg and the other leg as well. ° °Exercises include: °  °• Quad Sets - Tighten up the muscle on the front of the thigh (Quad) and hold for 5-10 seconds.   °• Straight Leg Raises - With your knee straight (if you were given a brace, keep it on), lift the leg to 60 degrees, hold for 3 seconds, and slowly lower the leg.  Perform this exercise against resistance later as your leg gets stronger.  °• Leg Slides: Lying on your back, slowly slide your foot toward your buttocks, bending your knee up off the floor (only go as far as is comfortable). Then slowly slide your foot back down until your leg is flat on the floor again.  °• Angel Wings: Lying on your back spread your legs to the side as far apart as you can without causing discomfort.  °• Hamstring Strength:  Lying on your back, push your heel against the floor with your leg straight by tightening up the   muscles of your buttocks.  Repeat, but this time bend your knee to a comfortable angle, and push your heel against the floor.  You may put a pillow under the heel to make it more comfortable if necessary.  ° °A rehabilitation program following joint replacement surgery can speed recovery and prevent re-injury in the future due to weakened muscles. Contact your doctor or a physical therapist for more information on knee rehabilitation.  ° ° °CONSTIPATION ° °Constipation is defined medically as fewer than three stools per week and severe constipation as less than one stool per week.  Even if you have a regular bowel pattern at home, your normal regimen is likely to be disrupted due to multiple reasons following surgery.  Combination of anesthesia, postoperative narcotics, change in appetite and fluid intake all can affect your bowels.  ° °YOU MUST use at least one of the following options; they are listed in order of increasing strength to get the job done.  They are all available over the counter, and you may need to use some,  POSSIBLY even all of these options:   ° °Drink plenty of fluids (prune juice may be helpful) and high fiber foods °Colace 100 mg by mouth twice a day  °Senokot for constipation as directed and as needed Dulcolax (bisacodyl), take with full glass of water  °Miralax (polyethylene glycol) once or twice a day as needed. ° °If you have tried all these things and are unable to have a bowel movement in the first 3-4 days after surgery call either your surgeon or your primary doctor.   ° °If you experience loose stools or diarrhea, hold the medications until you stool forms back up.  If your symptoms do not get better within 1 week or if they get worse, check with your doctor.  If you experience "the worst abdominal pain ever" or develop nausea or vomiting, please contact the office immediately for further recommendations for treatment. ° ° °ITCHING:  If you experience itching with your medications, try taking only a single pain pill, or even half a pain pill at a time.  You can also use Benadryl over the counter for itching or also to help with sleep.  ° °TED HOSE STOCKINGS:  Use stockings on both legs until for at least 2 weeks or as directed by physician office. They may be removed at night for sleeping. ° °MEDICATIONS:  See your medication summary on the “After Visit Summary” that nursing will review with you.  You may have some home medications which will be placed on hold until you complete the course of blood thinner medication.  It is important for you to complete the blood thinner medication as prescribed. ° °PRECAUTIONS:  If you experience chest pain or shortness of breath - call 911 immediately for transfer to the hospital emergency department.  ° °If you develop a fever greater that 101 F, purulent drainage from wound, increased redness or drainage from wound, foul odor from the wound/dressing, or calf pain - CONTACT YOUR SURGEON.   °                                                °FOLLOW-UP APPOINTMENTS:  If  you do not already have a post-op appointment, please call the office for an appointment to be seen by your surgeon.  Guidelines for how   soon to be seen are listed in your “After Visit Summary”, but are typically between 1-4 weeks after surgery. ° °OTHER INSTRUCTIONS:  ° °Knee Replacement:  Do not place pillow under knee, focus on keeping the knee straight while resting. CPM instructions: 0-90 degrees, 2 hours in the morning, 2 hours in the afternoon, and 2 hours in the evening. Place foam block, curve side up under heel at all times except when in CPM or when walking.  DO NOT modify, tear, cut, or change the foam block in any way. ° °MAKE SURE YOU:  °• Understand these instructions.  °• Get help right away if you are not doing well or get worse.  ° ° °Thank you for letting us be a part of your medical care team.  It is a privilege we respect greatly.  We hope these instructions will help you stay on track for a fast and full recovery!  ° ° °

## 2015-09-07 NOTE — Progress Notes (Signed)
Subjective: 1 Day Post-Op Procedure(s) (LRB): RIGHT TOTAL KNEE ARTHROPLASTY (Right) Patient reports pain as moderate.  Potassium down - is on chronic potassium.  Asymptomatic from hypokalemia.  Objective: Vital signs in last 24 hours: Temp:  [97.2 F (36.2 C)-100.8 F (38.2 C)] 99.5 F (37.5 C) (03/11 0958) Pulse Rate:  [59-97] 88 (03/11 0958) Resp:  [16-18] 18 (03/11 0958) BP: (105-141)/(61-72) 141/72 mmHg (03/11 0958) SpO2:  [94 %-100 %] 100 % (03/11 0958)  Intake/Output from previous day: 03/10 0701 - 03/11 0700 In: 3951.3 [P.O.:1080; I.V.:2771.3; IV Piggyback:100] Out: 2125 [Urine:2100; Blood:25] Intake/Output this shift: Total I/O In: 240 [P.O.:240] Out: 650 [Urine:650]   Recent Labs  09/07/15 0449  HGB 9.4*    Recent Labs  09/07/15 0449  WBC 8.7  RBC 3.14*  HCT 28.0*  PLT 137*    Recent Labs  09/07/15 0449  NA 137  K 2.9*  CL 104  CO2 26  BUN 7  CREATININE 0.74  GLUCOSE 140*  CALCIUM 8.7*   No results for input(s): LABPT, INR in the last 72 hours.  Sensation intact distally Intact pulses distally Dorsiflexion/Plantar flexion intact Incision: scant drainage No cellulitis present Compartment soft  Assessment/Plan: 1 Day Post-Op Procedure(s) (LRB): RIGHT TOTAL KNEE ARTHROPLASTY (Right) Up with therapy Discharge home with home health next 1-2 days. Will give Kdur once.  Chelsea Wheeler Y 09/07/2015, 10:35 AM

## 2015-09-07 NOTE — Progress Notes (Signed)
Physical Therapy Treatment Patient Details Name: BELKYS HENAULT MRN: [id number] DOB: 09-06-1960 Today's Date: 09/07/2015    History of Present Illness R TKR    PT Comments    Pt feeling better this pm and progressing well with mobility.  Follow Up Recommendations  Home health PT     Equipment Recommendations  None recommended by PT    Recommendations for Other Services OT consult     Precautions / Restrictions Precautions Precautions: Knee;Fall Required Braces or Orthoses: Knee Immobilizer - Right Knee Immobilizer - Right: Discontinue once straight leg raise with < 10 degree lag Restrictions Weight Bearing Restrictions: No Other Position/Activity Restrictions: WBAT    Mobility  Bed Mobility Overal bed mobility: Needs Assistance Bed Mobility: Supine to Sit     Supine to sit: Min assist Sit to supine: Min guard   General bed mobility comments: cues for sequence with pt self assisting L LE with UEs  Transfers Overall transfer level: Needs assistance Equipment used: Rolling walker (2 wheeled) Transfers: Sit to/from Stand Sit to Stand: Min guard Stand pivot transfers: Min guard       General transfer comment: cues for LE management and use of UEs to self assist  Ambulation/Gait Ambulation/Gait assistance: Min guard Ambulation Distance (Feet): 98 Feet Assistive device: Rolling walker (2 wheeled) Gait Pattern/deviations: Step-to pattern;Decreased step length - right;Decreased step length - left;Shuffle;Trunk flexed Gait velocity: decr   General Gait Details: cues for sequence, posture, stride length and position from [employer]            Wheelchair Mobility    Modified Rankin (Stroke Patients Only)       Balance                                    Cognition Arousal/Alertness: Awake/alert Behavior During Therapy: WFL for tasks assessed/performed Overall Cognitive Status: Within Functional Limits for tasks assessed                       Exercises      General Comments General comments (skin integrity, edema, etc.): Pt instructed in use of walker bag/basket to maneuver items around her home       Pertinent Vitals/Pain Pain Assessment: 0-10 Pain Score: 5  Pain Location: R knee/thigh Pain Descriptors / Indicators: Aching;Sore Pain Intervention(s): Limited activity within patient's tolerance;Monitored during session;Premedicated before session;Ice applied    Home Living Family/patient expects to be discharged to:: Private residence Living Arrangements: Spouse/significant other Available Help at Discharge: Family Type of Home: House Home Access: Level entry   Home Layout: Able to live on main level with bedroom/bathroom Home Equipment: Walker - 2 wheels;Bedside commode Additional Comments: Pt reports her fiance' and sister will be available to assist at discharge     Prior Function Level of Independence: Independent      Comments: Pt drives school bus    PT Goals (current goals can now be found in the care plan section) Acute Rehab PT Goals Patient Stated Goal: get back to normal  PT Goal Formulation: With patient Time For Goal Achievement: 09/11/15 Potential to Achieve Goals: Good Progress towards PT goals: Progressing toward goals    Frequency  7X/week    PT Plan Current plan remains appropriate    Co-evaluation             End of Session Equipment Utilized During Treatment: Gait belt;Right knee  immobilizer Activity Tolerance: Patient tolerated treatment well Patient left: in chair     Time: 1455-1516 PT Time Calculation (min) (ACUTE ONLY): 21 min  Charges:  $Gait Training: 8-22 mins                    G Codes:      Azariyah Luhrs 09/07/2015, 4:44 PM

## 2015-09-07 NOTE — Care Management Note (Signed)
Case Management Note  Patient Details  Name: Chelsea Wheeler MRN: [id number] Date of Birth: 1961-01-07  Subjective/Objective:    RIGHT TOTAL KNEE ARTHROPLASTY                 Action/Plan: Discharge Planning: AVS reviewed   NCM spoke to pt and husband at bedside. [facility]. [facility] Has RW and 3n1 at home. Husband at home to assist with her care.     Expected Discharge Date:  09/07/2015              Expected Discharge Plan:  Home w Home Health Services  In-House Referral:  NA  Discharge planning Services  CM Consult  Post Acute Care Choice:  Home Health Choice offered to:  Patient  DME Arranged:  N/A DME Agency:  NA  HH Arranged:  PT [facility]  Status of Service:     Medicare Important Message Given:    Date Medicare IM Given:    Medicare IM give by:    Date Additional Medicare IM Given:    Additional Medicare Important Message give by:     If discussed at Long Length of Stay Meetings, dates discussed:    Additional Comments:  Elliot CousinShavis, Leilani Cespedes Ellen, RN 09/07/2015, 6:47 PM

## 2015-09-07 NOTE — Evaluation (Signed)
Occupational Therapy Evaluation Patient Details Name: Chelsea Wheeler MRN: [id number] DOB: 04-30-1961 Today's Date: 09/07/2015    History of Present Illness R TKR   Clinical Impression   Patient evaluated by Occupational Therapy with no further acute OT needs identified. All education has been completed and the patient has no further questions. Pt is able to perform ADLs at min guard assist level.  She demonstrates good safety awareness.  All education completed.  See below for any follow-up Occupational Therapy or equipment needs. OT is signing off. Thank you for this referral.      Follow Up Recommendations  No OT follow up;Supervision - Intermittent    Equipment Recommendations  None recommended by OT    Recommendations for Other Services       Precautions / Restrictions Precautions Precautions: Knee;Fall Required Braces or Orthoses: Knee Immobilizer - Right Knee Immobilizer - Right: Discontinue once straight leg raise with < 10 degree lag Restrictions Weight Bearing Restrictions: No Other Position/Activity Restrictions: WBAT      Mobility Bed Mobility Overal bed mobility: Needs Assistance Bed Mobility: Sit to Supine       Sit to supine: Min guard   General bed mobility comments: pt requires increased time to lift Lt LE, but was able to do so without physical assist   Transfers Overall transfer level: Needs assistance Equipment used: Rolling walker (2 wheeled) Transfers: Sit to/from [employer] Sit to Stand: Min guard Stand pivot transfers: Min guard            Balance                                            ADL Overall ADL's : Needs assistance/impaired Eating/Feeding: Independent;Sitting   Grooming: Wash/dry hands;Wash/dry face;Oral care;Brushing hair;Min guard;Standing   Upper Body Bathing: Set up;Sitting   Lower Body Bathing: Min guard;Sit to/from stand   Upper Body Dressing : Set up;Sitting   Lower  Body Dressing: Min guard;Sit to/from stand   Toilet Transfer: Min guard;Ambulation;Comfort height toilet;Grab bars;RW   Toileting- Architect and Hygiene: Min guard;Sit to/from Nurse, children's Details (indicate cue type and reason): Pt plans to sponge bathe until she feels confident stepping over the tub  Functional mobility during ADLs: Min guard;Rolling walker General ADL Comments: Pt is very motivated. She is able to don/doff sock seated.  Demonstrates good Corporate treasurer     Praxis      Pertinent Vitals/Pain Pain Assessment: 0-10 Pain Score: 5  Pain Location: Rt knee  Pain Descriptors / Indicators: Aching Pain Intervention(s): Monitored during session;Repositioned     Hand Dominance Right   Extremity/Trunk Assessment Upper Extremity Assessment Upper Extremity Assessment: Overall WFL for tasks assessed   Lower Extremity Assessment Lower Extremity Assessment: Defer to PT evaluation   Cervical / Trunk Assessment Cervical / Trunk Assessment: Normal   Communication Communication Communication: No difficulties   Cognition Arousal/Alertness: Awake/alert Behavior During Therapy: WFL for tasks assessed/performed Overall Cognitive Status: Within Functional Limits for tasks assessed                     General Comments       Exercises       Shoulder Instructions      Home Living Family/patient expects to be discharged to:: Private  residence Living Arrangements: Spouse/significant other Available Help at Discharge: Family Type of Home: House Home Access: Level entry     Home Layout: Able to live on main level with bedroom/bathroom     Bathroom Shower/Tub: Tub/shower unit Shower/tub characteristics: Curtain FirefighterBathroom Toilet: Standard     Home Equipment: Environmental consultantWalker - 2 wheels;Bedside commode   Additional Comments: Pt reports her fiance' and sister will be available to assist at discharge        Prior Functioning/Environment Level of Independence: Independent        Comments: Pt drives school bus     OT Diagnosis: Generalized weakness;Acute pain   OT Problem List: Decreased strength;Pain   OT Treatment/Interventions:      OT Goals(Current goals can be found in the care plan section) Acute Rehab OT Goals Patient Stated Goal: get back to normal  OT Goal Formulation: All assessment and education complete, DC therapy  OT Frequency:     Barriers to D/C:            Co-evaluation              End of Session Equipment Utilized During Treatment: Rolling walker CPM Right Knee CPM Right Knee: Off Nurse Communication: Mobility status  Activity Tolerance: Patient tolerated treatment well Patient left: in bed;with call bell/phone within reach   Time: 1510-1536 OT Time Calculation (min): 26 min Charges:  OT General Charges $OT Visit: 1 Procedure OT Evaluation $OT Eval Low Complexity: 1 Procedure OT Treatments $Self Care/Home Management : 8-22 mins G-Codes:    Samra Pesch M 09/07/2015, 3:58 PM

## 2015-09-08 LAB — CBC
HEMATOCRIT: 25.5 % — AB (ref 36.0–46.0)
Hemoglobin: 8.7 g/dL — ABNORMAL LOW (ref 12.0–15.0)
MCH: 30.5 pg (ref 26.0–34.0)
MCHC: 34.1 g/dL (ref 30.0–36.0)
MCV: 89.5 fL (ref 78.0–100.0)
Platelets: 139 10*3/uL — ABNORMAL LOW (ref 150–400)
RBC: 2.85 MIL/uL — ABNORMAL LOW (ref 3.87–5.11)
RDW: 12.9 % (ref 11.5–15.5)
WBC: 11.3 10*3/uL — ABNORMAL HIGH (ref 4.0–10.5)

## 2015-09-08 MED ORDER — METHOCARBAMOL 500 MG PO TABS: 500.0000 mg | ORAL_TABLET | Freq: Four times a day (QID) | ORAL | Status: DC | PRN

## 2015-09-08 MED ORDER — RIVAROXABAN 10 MG PO TABS: 10.0000 mg | ORAL_TABLET | Freq: Every day | ORAL | Status: DC

## 2015-09-08 MED ORDER — HYDROMORPHONE HCL 2 MG PO TABS: 2.0000 mg | ORAL_TABLET | ORAL | Status: DC | PRN

## 2015-09-08 NOTE — Progress Notes (Signed)
Physical Therapy Treatment Patient Details Name: Chelsea Wheeler MRN: [id number] DOB: Oct 27, 1960 Today's Date: 09/08/2015    History of Present Illness R TKR    PT Comments    Pt progressing steadily with mobility.  Reviewed therex and don/doff KI.  Follow Up Recommendations  Home health PT     Equipment Recommendations  None recommended by PT    Recommendations for Other Services OT consult     Precautions / Restrictions Precautions Precautions: Knee;Fall Required Braces or Orthoses: Knee Immobilizer - Right Knee Immobilizer - Right: Discontinue once straight leg raise with < 10 degree lag Restrictions Weight Bearing Restrictions: No Other Position/Activity Restrictions: WBAT    Mobility  Bed Mobility               General bed mobility comments: Pt OOB with nursing and declines back to bed  Transfers Overall transfer level: Needs assistance Equipment used: Rolling walker (2 wheeled) Transfers: Sit to/from Stand Sit to Stand: Supervision         General transfer comment: min cues for use of UEs.  Pt to/from recliner and 3n1  Ambulation/Gait Ambulation/Gait assistance: Min guard;Supervision Ambulation Distance (Feet): 68 Feet (and 2 x 15' to/from bathroom) Assistive device: Rolling walker (2 wheeled) Gait Pattern/deviations: Step-to pattern;Shuffle;Trunk flexed Gait velocity: decr   General Gait Details: min cues for posture and position from Rohm and HaasW   Stairs            Wheelchair Mobility    Modified Rankin (Stroke Patients Only)       Balance                                    Cognition Arousal/Alertness: Awake/alert Behavior During Therapy: WFL for tasks assessed/performed Overall Cognitive Status: Within Functional Limits for tasks assessed                      Exercises Total Joint Exercises Ankle Circles/Pumps: AROM;Both;15 reps;Supine Quad Sets: AROM;Both;Supine;20 reps Heel Slides:  AAROM;Right;Supine;20 reps Straight Leg Raises: AAROM;Right;20 reps;Supine Goniometric ROM: AAROM at R knee -10- 40 with muscle guarding    General Comments        Pertinent Vitals/Pain Pain Assessment: No/denies pain Pain Score: 5  Pain Location: R knee/thigh Pain Descriptors / Indicators: Aching;Sore Pain Intervention(s): Monitored during session;Limited activity within patient's tolerance;Premedicated before session;Ice applied    Home Living                      Prior Function            PT Goals (current goals can now be found in the care plan section) Acute Rehab PT Goals Patient Stated Goal: get back to normal  PT Goal Formulation: With patient Time For Goal Achievement: 09/11/15 Potential to Achieve Goals: Good Progress towards PT goals: Progressing toward goals    Frequency  7X/week    PT Plan Current plan remains appropriate    Co-evaluation             End of Session Equipment Utilized During Treatment: Gait belt;Right knee immobilizer Activity Tolerance: Patient tolerated treatment well Patient left: in chair     Time: 1135-1227 PT Time Calculation (min) (ACUTE ONLY): 52 min  Charges:  $Gait Training: 8-22 mins $Therapeutic Exercise: 8-22 mins $Therapeutic Activity: 8-22 mins  G Codes:      Chelsea Wheeler 2015/09/27, 12:46 PM

## 2015-09-08 NOTE — Progress Notes (Signed)
Subjective: 2 Days Post-Op Procedure(s) (LRB): RIGHT TOTAL KNEE ARTHROPLASTY (Right) Patient reports pain as moderate.    Objective: Vital signs in last 24 hours: Temp:  [98.9 F (37.2 C)-100 F (37.8 C)] 100 F (37.8 C) (03/12 0628) Pulse Rate:  [88-104] 104 (03/12 0628) Resp:  [16-18] 17 (03/12 0628) BP: (124-144)/(61-78) 124/61 mmHg (03/12 0628) SpO2:  [100 %] 100 % (03/12 0628)  Intake/Output from previous day: 03/11 0701 - 03/12 0700 In: 1320 [P.O.:1320] Out: 2650 [Urine:2650] Intake/Output this shift:     Recent Labs  09/07/15 0449 09/08/15 0521  HGB 9.4* 8.7*    Recent Labs  09/07/15 0449 09/08/15 0521  WBC 8.7 11.3*  RBC 3.14* 2.85*  HCT 28.0* 25.5*  PLT 137* 139*    Recent Labs  09/07/15 0449  NA 137  K 2.9*  CL 104  CO2 26  BUN 7  CREATININE 0.74  GLUCOSE 140*  CALCIUM 8.7*   No results for input(s): LABPT, INR in the last 72 hours.  Sensation intact distally Intact pulses distally Dorsiflexion/Plantar flexion intact Incision: dressing C/D/I No cellulitis present Compartment soft  Assessment/Plan: 2 Days Post-Op Procedure(s) (LRB): RIGHT TOTAL KNEE ARTHROPLASTY (Right) Up with therapy Discharge home with home health today  Kathryne HitchBLACKMAN,Chelsea Andringa Y 09/08/2015, 7:52 AM

## 2015-09-08 NOTE — Progress Notes (Signed)
Discharged from floor via w/c, belongings & family with pt. No changes in assessment. Chelsea Wheeler  

## 2015-09-08 NOTE — Discharge Summary (Signed)
Patient ID: Chelsea Wheeler Beddow MRN: [id number] DOB/AGE: 06/29/1961 55 y.o.  Admit date: 09/06/2015 Discharge date: 09/08/2015  Admission Diagnoses:  Principal Problem:   Osteoarthritis of right knee Active Problems:   Status post total right knee replacement   Discharge Diagnoses:  Same  Past Medical History  Diagnosis Date  . Seasonal allergies   . Hypertension   . Arthritis     knee  . Claustrophobia   . GERD (gastroesophageal reflux disease)     Surgeries: Procedure(s): RIGHT TOTAL KNEE ARTHROPLASTY on 09/06/2015   Consultants:    Discharged Condition: Improved  Hospital Course: Chelsea Wheeler Cleek is an 55 y.o. female who was admitted 09/06/2015 for operative treatment ofOsteoarthritis of right knee. Patient has severe unremitting pain that affects sleep, daily activities, and work/hobbies. After pre-op clearance the patient was taken to the operating room on 09/06/2015 and underwent  Procedure(s): RIGHT TOTAL KNEE ARTHROPLASTY.    Patient was given perioperative antibiotics: Anti-infectives    Start     Dose/Rate Route Frequency Ordered Stop   09/06/15 1330  clindamycin (CLEOCIN) IVPB 600 mg     600 mg 100 mL/hr over 30 Minutes Intravenous Every 6 hours 09/06/15 1104 09/06/15 2030   09/06/15 0535  clindamycin (CLEOCIN) IVPB 900 mg     900 mg 100 mL/hr over 30 Minutes Intravenous On call to O.R. 09/06/15 0535 09/06/15 [id number]       Patient was given sequential compression devices, early ambulation, and chemoprophylaxis to prevent DVT.  Patient benefited maximally from hospital stay and there were no complications.    Recent vital signs: Patient Vitals for the past 24 hrs:  BP Temp Temp src Pulse Resp SpO2  09/08/15 0628 124/61 mmHg 100 F (37.8 C) Oral (!) 104 17 100 %  09/07/15 2159 (!) 144/78 mmHg 99.4 F (37.4 C) Oral (!) 102 17 100 %  09/07/15 1445 134/67 mmHg 98.9 F (37.2 C) Oral 98 16 100 %  09/07/15 0958 (!) 141/72 mmHg 99.5 F (37.5 C) Oral 88 18 100 %      Recent laboratory studies:  Recent Labs  09/07/15 0449 09/08/15 0521  WBC 8.7 11.3*  HGB 9.4* 8.7*  HCT 28.0* 25.5*  PLT 137* 139*  NA 137  --   K 2.9*  --   CL 104  --   CO2 26  --   BUN 7  --   CREATININE 0.74  --   GLUCOSE 140*  --   CALCIUM 8.7*  --      Discharge Medications:     Medication List    STOP taking these medications        meloxicam 7.5 MG tablet  Commonly known as:  MOBIC     traMADol 50 MG tablet  Commonly known as:  ULTRAM      TAKE these medications        antioxidant formula SG capsule  Take 1 capsule by mouth daily.     cetirizine 10 MG tablet  Commonly known as:  ZYRTEC  Take 10 mg by mouth daily.     diazepam 5 MG tablet  Commonly known as:  VALIUM  Take 1 tablet (5 mg total) by mouth 2 (two) times daily.     EPIPEN 2-PAK 0.3 mg/0.3 mL Soaj injection  Generic drug:  EPINEPHrine  Inject 0.3 mg into the muscle once.     esomeprazole 40 MG capsule  Commonly known as:  NEXIUM  Take 40 mg by mouth every morning.  fluticasone 50 MCG/ACT nasal spray  Commonly known as:  FLONASE  Place 1 spray into both nostrils daily as needed for allergies or rhinitis.     hydrochlorothiazide 25 MG tablet  Commonly known as:  HYDRODIURIL  Take 25 mg by mouth every morning.     HYDROcodone-acetaminophen 5-325 MG tablet  Commonly known as:  NORCO/VICODIN  Take 1 tablet by mouth every 6 (six) hours as needed for moderate pain.     NORCO 5-325 MG tablet  Generic drug:  HYDROcodone-acetaminophen  Take 1-2 tablets by mouth every 6 (six) hours as needed for moderate pain. MAXIMUM TOTAL ACETAMINOPHEN DOSE IS 4000 MG PER DAY     HYDROmorphone 2 MG tablet  Commonly known as:  DILAUDID  Take 1 tablet (2 mg total) by mouth every 4 (four) hours as needed for severe pain.     methocarbamol 500 MG tablet  Commonly known as:  ROBAXIN  Take 1 tablet (500 mg total) by mouth every 6 (six) hours as needed for muscle spasms.     potassium chloride  10 MEQ tablet  Commonly known as:  K-DUR  Take 10 mEq by mouth 2 (two) times daily.     PRESCRIPTION MEDICATION  Inject 1 cartridge into the skin 2 (two) times a week. ALLERGY SHOT Mondays and Thursdays     rivaroxaban 10 MG Tabs tablet  Commonly known as:  XARELTO  Take 1 tablet (10 mg total) by mouth daily with breakfast.     VITAMIN D3 PO  Take 1 tablet by mouth daily.     zolpidem 12.5 MG CR tablet  Commonly known as:  AMBIEN CR  Take 12.5 mg by mouth at bedtime as needed for sleep.        Diagnostic Studies: Dg Knee Right Port  09/06/2015  CLINICAL DATA:  Post RIGHT knee arthroplasty EXAM: PORTABLE RIGHT KNEE - 1-2 VIEW COMPARISON:  Portable exam 0936 hours correlated with prior MR knee 09/27/2010 FINDINGS: Components of RIGHT knee prosthesis with a central post identified in expected positions. Diffuse osseous demineralization. No acute fracture, dislocation or bone destruction. Expected soft tissue changes. IMPRESSION: RIGHT knee prosthesis without acute complication. Electronically Signed   By: Ulyses Southward Wheeler.D.   On: 09/06/2015 09:56    Disposition: 01-Home or Self Care      Discharge Instructions    Discharge patient    Complete by:  As directed            Follow-up Information    Follow up with [facility]   Why:  Home Health Physical Therapy   Contact information:   [address] [place] [address] [phone]       Follow up with Kathryne Hitch, MD In 2 weeks.   Specialty:  Orthopedic Surgery   Contact information:   [address] [place] [address] [phone]        Signed: Kathryne Hitch 09/08/2015, 7:54 AM

## 2015-09-24 ENCOUNTER — Ambulatory Visit: Payer: BC Managed Care – PPO | Attending: Orthopaedic Surgery | Admitting: Physical Therapy

## 2015-09-24 DIAGNOSIS — M6281 Muscle weakness (generalized): Secondary | ICD-10-CM | POA: Insufficient documentation

## 2015-09-24 DIAGNOSIS — M25661 Stiffness of right knee, not elsewhere classified: Secondary | ICD-10-CM | POA: Diagnosis not present

## 2015-09-24 DIAGNOSIS — R2689 Other abnormalities of gait and mobility: Secondary | ICD-10-CM

## 2015-09-24 DIAGNOSIS — R600 Localized edema: Secondary | ICD-10-CM | POA: Diagnosis present

## 2015-09-24 NOTE — Therapy (Signed)
[facility] [address] [place], [place], [address] Phone: [phone]   Fax:  [phone]  Physical Therapy Evaluation  Patient Details  Name: Chelsea Wheeler MRN: [id number] Date of Birth: 06-Dec-1960 Referring Provider: Allie Bossierhris Blackman MD  Encounter Date: 09/24/2015      PT End of Session - 09/24/15 1610    Visit Number 1   Number of Visits 16   Date for PT Re-Evaluation 11/19/15   Authorization Type BCBS   Activity Tolerance Patient tolerated treatment well   Behavior During Therapy [facility] for tasks assessed/performed      Past Medical History  Diagnosis Date  . Seasonal allergies   . Hypertension   . Arthritis     knee  . Claustrophobia   . GERD (gastroesophageal reflux disease)     Past Surgical History  Procedure Laterality Date  . Tubal ligation  08-31-06  . Endometrial ablation  2007  . Knee arthroscopy Right 03/22/2015    Procedure: ARTHROSCOPY KNEE, partial lateral meniscectomy, microfracture, chondraplasty;  Surgeon: Erin SonsHarold Kernodle, MD;  Location: [facility]S;  Service: Orthopedics;  Laterality: Right;  . Total knee arthroplasty Right 09/06/2015    Procedure: RIGHT TOTAL KNEE ARTHROPLASTY;  Surgeon: Kathryne Hitchhristopher Y Blackman, MD;  Location: [facility];  Service: Orthopedics;  Laterality: Right;    There were no vitals filed for this visit.  Visit Diagnosis:  Stiffness of right knee, not elsewhere classified - Plan: PT plan of care cert/re-cert  Muscle weakness (generalized) - Plan: PT plan of care cert/re-cert  Other abnormalities of gait and mobility - Plan: PT plan of care cert/re-cert  Localized edema - Plan: PT plan of care cert/re-cert      Subjective Assessment - 09/24/15 1343    Subjective pt is a 55 y.o F s/p R TKA on3/03/2016. She currenlty uses a RW in the clinic to get around. She reports she is doing well since the surgery and has been working on her bending more at home and has been consistent with  her exercises that the physician gave her.    Limitations Standing;House hold activities;Lifting;Sitting   How long can you sit comfortably? 15-20 min   How long can you stand comfortably? 15-20 min   How long can you walk comfortably? with RW 15 min   Diagnostic tests 09/19/2015 x-ray per pt report everything looked good   Patient Stated Goals to get back to walking, get stronger, get back to normal activities   Currently in Pain? Yes   Pain Score 4   took pain medication at 11:15   Pain Location Knee   Pain Orientation Right   Pain Descriptors / Indicators Aching;Sore;Shooting;Sharp;Tightness  stiffness   Pain Type Surgical pain   Pain Onset More than a month ago   Pain Frequency Constant   Aggravating Factors  bending the knee, prolonged standing/ walking   Pain Relieving Factors trying to relax, medication, ice            [facility] PT Assessment - 09/24/15 1325    Assessment   Medical Diagnosis S/P R total knee   Referring Provider Allie Bossierhris Blackman MD   Onset Date/Surgical Date 09/05/16   Hand Dominance Right   Next MD Visit 10/17/2015   Prior Therapy no   Precautions   Precaution Comments no driving   Restrictions   Weight Bearing Restrictions No   Balance Screen   Has the patient fallen in the past 6 months Yes   How many times? 1  Has the patient had a decrease in activity level because of a fear of falling?  No   Is the patient reluctant to leave their home because of a fear of falling?  No   Home Tourist information centre manager residence   Living Arrangements Spouse/significant other;Other relatives   Available Help at Discharge Available PRN/intermittently;Available 24 hours/day   Type of Home House   Home Access Level entry   Home Layout Two level   Alternate Level Stairs-Number of Steps 14   Home Equipment Walker - 2 wheels;Crutches;Cane - single point   Prior Function   Level of Independence Independent   Vocation Unemployed  school bus driver    Leisure walking around the mall,    Cognition   Overall Cognitive Status Within Functional Limits for tasks assessed   Observation/Other Assessments   Focus on Therapeutic Outcomes (FOTO)  67% limited  predicted 44% limited    ROM / Strength   AROM / PROM / Strength AROM;PROM;Strength   AROM   AROM Assessment Site Knee   Right/Left Knee Right;Left   Right Knee Extension -18   Right Knee Flexion 65   Left Knee Extension 0   Left Knee Flexion 138   PROM   PROM Assessment Site Knee   Right/Left Knee Right;Left   Right Knee Extension -12   Right Knee Flexion 71   Strength   Strength Assessment Site Hip;Knee   Right/Left Hip Right;Left   Right Hip Flexion 3-/5   Right Hip Extension 2+/5   Right Hip ABduction 4-/5   Right Hip ADduction 3+/5   Left Hip Flexion 4+/5   Left Hip ABduction 3+/5   Left Hip ADduction 4-/5   Right/Left Knee Right;Left   Left Knee Flexion 5/5   Left Knee Extension 5/5   Ambulation/Gait   Ambulation/Gait Yes   Gait Pattern Step-through pattern;Trendelenburg;Antalgic                           PT Education - 09/24/15 1610    Education provided Yes   Education Details evaluation findings, POC, Goals, HEP   Person(s) Educated Patient   Methods Explanation   Comprehension Verbalized understanding          PT Short Term Goals - 09/24/15 1617    PT SHORT TERM GOAL #1   Title pt will be I with initial HEP (10/25/2015)   Time 4   Period Weeks   Status New   PT SHORT TERM GOAL #2   Title pt will improve her knee extension to </= -13 degrees and flexion to >/= 80 degrees with </= 5/10 pain to assist with improved knee mobility (10/25/2015)   Time 4   Period Weeks   Status New   PT SHORT TERM GOAL #3   Title she will improve her R hip/knee strength to >/= 4-/5 with </= 5/10 pain to assist with functional progression (10/25/2015)   Time 4   Period Weeks   Status New   PT SHORT TERM GOAL #4   Title she will be able to walk >/= 15  min with LRAD with </= 5/10 pain with +1 SBA to promote safety and improve functional gait (10/25/2015)   Time 4   Period Weeks   Status New           PT Long Term Goals - 09/24/15 1620    PT LONG TERM GOAL #1   Title pt will be  I with all HEP as of last visit (11/19/2015)   Time 8   Period Weeks   Status New   PT LONG TERM GOAL #2   Title pt will improve her knee extension to </= -5 degrees and flexion to >/= 120 degrees to assist with functional and efficient gait pattern (11/19/2015)   Time 8   Period Weeks   Status New   PT LONG TERM GOAL #3   Title pt will improve her R hip/ knee strength to >/= 4/5 with </= 2/10 pain to assist with safety with prolonged walking and standing activities (11/19/2015)   Time 8   Period Weeks   Status New   PT LONG TERM GOAL #4   Title pt willbe able to walk for >/= 20  minutes with no AD with </= 2/10 pain to assist with functional endurance (11/19/2015)   Time 8   Period Weeks   Status New   PT LONG TERM GOAL #5   Title pt will improve her FOTO score to >/= 50 at discharge to demonstrate improvement in function at discharge (11/19/2015)   Time 8   Period Weeks   Status New               Plan - 09/24/15 1610    Clinical Impression Statement Chelsea Wheeler present to OPPT as a moderate complexity evaluation s/p R TKA on 09/06/2015. She demonstrates limited knee AROM and PROM secondary to pain and stiffness noted at end ranges. She demonstrates limited strength in the R hip/ knee due to pain and stiffness. Palpation revealed soreness peri-patellar and along the incision site. She currently uses a RW to get around and demonstrates an antalgic gait pattern with decreased stride and step length on the L and decreaed stance time on the R. She would benefit from physical therapy to improve R knee AROM, hip and knee strength, gait efficiency, and maximize her function by addressing the impairments listed.    Pt will benefit from skilled therapeutic  intervention in order to improve on the following deficits Pain;Improper body mechanics;Postural dysfunction;Increased edema;Decreased strength;Decreased mobility;Difficulty walking;Decreased range of motion;Decreased activity tolerance;Decreased endurance;Increased muscle spasms;Hypomobility;Decreased balance   Rehab Potential Good   PT Frequency 3x / week   PT Duration 8 weeks   PT Treatment/Interventions ADLs/Self Care Home Management;Cryotherapy;Electrical Stimulation;Iontophoresis /ml Dexamethasone;Therapeutic exercise;Moist Heat;Therapeutic activities;Manual techniques;Vasopneumatic Device;Dry needling;Passive range of motion;Ultrasound;Patient/family education;Balance training;Gait training;Stair training   PT Next Visit Plan assess/ response to HEP, AROM/ PROM, stretching, Mobs, bike vs nu-step, VASO, hip/knee strengthening   PT Home Exercise Plan heel slides with strap, hamstring stretch, SLR, SAQ   Consulted and Agree with Plan of Care Patient         Problem List Patient Active Problem List   Diagnosis Date Noted  . Osteoarthritis of right knee 09/06/2015  . Status post total right knee replacement 09/06/2015  . Cough 09/14/2012   Chelsea Wheeler PT, DPT, LAT, ATC  09/24/2015  4:29 PM      [facility] [address] [place], [place], [address] Phone: [phone]   Fax:  [phone]  Name: Chelsea Wheeler MRN: [id number] Date of Birth: 1960/10/07

## 2015-09-25 ENCOUNTER — Ambulatory Visit: Payer: BC Managed Care – PPO

## 2015-09-25 DIAGNOSIS — R2689 Other abnormalities of gait and mobility: Secondary | ICD-10-CM

## 2015-09-25 DIAGNOSIS — M25661 Stiffness of right knee, not elsewhere classified: Secondary | ICD-10-CM

## 2015-09-25 DIAGNOSIS — M6281 Muscle weakness (generalized): Secondary | ICD-10-CM

## 2015-09-25 DIAGNOSIS — R600 Localized edema: Secondary | ICD-10-CM

## 2015-09-25 NOTE — Patient Instructions (Signed)
Total Knee Replacement, Care After Refer to this sheet in the next few weeks. These instructions provide you with information on caring for yourself after your procedure. Your health care provider also may give you specific instructions. Your treatment has been planned according to the most current medical practices, but problems sometimes occur. Call your health care provider if you have any problems or questions after your procedure. HOME CARE INSTRUCTIONS   See a physical therapist as directed by your health care provider.  Take medicines only as directed by your health care provider.  Avoid lifting or driving until you are instructed otherwise.  If you have been sent home with a continuous passive motion machine, use it as directed by your health care provider. SEEK MEDICAL CARE IF:  You have difficulty breathing.  You have drainage, redness, swelling, or pain at your incision site.  You have a bad smell coming from your incision site.  You have persistent bleeding from your incision site.  Your incision breaks open after sutures (stitches) or staples have been removed.  You have a fever. SEEK IMMEDIATE MEDICAL CARE IF:   You have a rash.  You have pain or swelling in your calf or thigh.  You have shortness of breath or chest pain.  Your range of motion in your knee is decreasing rather than increasing. MAKE SURE YOU:   Understand these instructions.  Will watch your condition.  Will get help right away if you are not doing well or get worse. Document Released: 01/02/2005 Document Revised: 10/30/2013 Document Reviewed: 08/04/2011 [facility] Patient Information 2015 [place], [place]. This information is not intended to replace advice given to you by your health care provider. Make sure you discuss any questions you have with your health care provider.  Cryotherapy Cryotherapy means treatment with cold. Ice or gel packs can be used to reduce both pain and swelling. Ice is  the most helpful within the first 24 to 48 hours after an injury or flare-up from overusing a muscle or joint. Sprains, strains, spasms, burning pain, shooting pain, and aches can all be eased with ice. Ice can also be used when recovering from surgery. Ice is effective, has very few side effects, and is safe for most people to use. PRECAUTIONS  Ice is not a safe treatment option for people with:  Raynaud phenomenon. This is a condition affecting small blood vessels in the extremities. Exposure to cold may cause your problems to return.  Cold hypersensitivity. There are many forms of cold hypersensitivity, including:  Cold urticaria. Red, itchy hives appear on the skin when the tissues begin to warm after being iced.  Cold erythema. This is a red, itchy rash caused by exposure to cold.  Cold hemoglobinuria. Red blood cells break down when the tissues begin to warm after being iced. The hemoglobin that carry oxygen are passed into the urine because they cannot combine with blood proteins fast enough.  Numbness or altered sensitivity in the area being iced. If you have any of the following conditions, do not use ice until you have discussed cryotherapy with your caregiver:  Heart conditions, such as arrhythmia, angina, or chronic heart disease.  High blood pressure.  Healing wounds or open skin in the area being iced.  Current infections.  Rheumatoid arthritis.  Poor circulation.  Diabetes. Ice slows the blood flow in the region it is applied. This is beneficial when trying to stop inflamed tissues from spreading irritating chemicals to surrounding tissues. However, if you expose your  skin to cold temperatures for too long or without the proper protection, you can damage your skin or nerves. Watch for signs of skin damage due to cold. HOME CARE INSTRUCTIONS Follow these tips to use ice and cold packs safely.  Place a dry or damp towel between the ice and skin. A damp towel will cool  the skin more quickly, so you may need to shorten the time that the ice is used.  For a more rapid response, add gentle compression to the ice.  Ice for no more than 20 minutes at a time. The bonier the area you are icing, the less time it will take to get the benefits of ice.  Check your skin after 5 minutes to make sure there are no signs of a poor response to cold or skin damage.  Rest 20 minutes or more between uses.  Once your skin is numb, you can end your treatment. You can test numbness by very lightly touching your skin. The touch should be so light that you do not see the skin dimple from the pressure of your fingertip. When using ice, most people will feel these normal sensations in this order: cold, burning, aching, and numbness.  Do not use ice on someone who cannot communicate their responses to pain, such as small children or people with dementia. HOW TO MAKE AN ICE PACK Ice packs are the most common way to use ice therapy. Other methods include ice massage, ice baths, and cryosprays. Muscle creams that cause a cold, tingly feeling do not offer the same benefits that ice offers and should not be used as a substitute unless recommended by your caregiver. To make an ice pack, do one of the following:  Place crushed ice or a bag of frozen vegetables in a sealable plastic bag. Squeeze out the excess air. Place this bag inside another plastic bag. Slide the bag into a pillowcase or place a damp towel between your skin and the bag.  Mix 3 parts water with 1 part rubbing alcohol. Freeze the mixture in a sealable plastic bag. When you remove the mixture from the freezer, it will be slushy. Squeeze out the excess air. Place this bag inside another plastic bag. Slide the bag into a pillowcase or place a damp towel between your skin and the bag. SEEK MEDICAL CARE IF:  You develop white spots on your skin. This may give the skin a blotchy (mottled) appearance.  Your skin turns blue or  pale.  Your skin becomes waxy or hard.  Your swelling gets worse. MAKE SURE YOU:   Understand these instructions.  Will watch your condition.  Will get help right away if you are not doing well or get worse. Document Released: 02/09/2011 Document Revised: 10/30/2013 Document Reviewed: 02/09/2011 [facility] Patient Information 2015 [place]. This information is not intended to replace advice given to you by your health care provider. Make sure you discuss any questions you have with your health care provider.  POSITIONING :  Elevate leg above heart, with knee resting into extension.  Place pillow under ankle, never under knee.     Ankle Pump  Bend ankles up and down, alternating feet. Repeat ____ times. Do ____ sessions per day.  Quad Set  With other leg bent, foot flat, slowly tighten muscles on thigh of straight leg while counting out loud to ____. Repeat with other leg. Repeat ____ times. Do ____ sessions per day.  Hip Flexion / Knee Extension: Straight-Leg Raise (Eccentric)  Lie on back. Lift leg with knee straight. Slowly lower leg for 3-5 seconds. ___ reps per set, ___ sets per day, ___ days per week. Lower like elevator, stopping at each floor. Add ___ lbs when you achieve ___ repetitions. Rest on elbows. Rest on straight arms.  Heel Slide  Bend left knee and pull heel toward buttocks. Use sheet, strap, or rope to actively assist knee further into flexion. Hold stretch 10 secs.  Repeat ____ times. Do ____ sessions per day.  KNEE: Extension, Long Arc Quads - Sitting   Raise leg until knee is straight. ___ reps per set, ___ sets per day, ___ days per week  FLEXION: Sitting (Active)  Sit, both feet flat. Lift right knee toward ceiling. Use ___ lbs. Complete ___ sets of ___ repetitions. Perform ___ sessions per day.  Copyright  VHI. All rights reserved.   HIP: Hamstrings - Short Sitting    Rest leg on raised surface. Keep knee straight. Lift chest.  Hold _30__ seconds. _3__ reps per set, __3_ sets per day, __7_ days per week  Copyright  VHI. All rights reserved.

## 2015-09-25 NOTE — Therapy (Signed)
[facility] [address] [place], [place], [address] Phone: [phone]   Fax:  [phone]  Physical Therapy Treatment  Patient Details  Name: Chelsea Wheeler MRN: [id number] Date of Birth: 10/14/1960 Referring Provider: Allie Bossier MD  Encounter Date: 09/25/2015      PT End of Session - 09/25/15 1123    Visit Number 2   Number of Visits 16   Date for PT Re-Evaluation 11/19/15   Authorization Type BCBS   PT Start Time 1100   PT Stop Time 1155   PT Time Calculation (min) 55 min   Activity Tolerance Patient tolerated treatment well;Patient limited by pain   Behavior During Therapy [facility] for tasks assessed/performed      Past Medical History  Diagnosis Date  . Seasonal allergies   . Hypertension   . Arthritis     knee  . Claustrophobia   . GERD (gastroesophageal reflux disease)     Past Surgical History  Procedure Laterality Date  . Tubal ligation  08-31-06  . Endometrial ablation  2007  . Knee arthroscopy Right 03/22/2015    Procedure: ARTHROSCOPY KNEE, partial lateral meniscectomy, microfracture, chondraplasty;  Surgeon: Erin Sons, MD;  Location: [facility] ORS;  Service: Orthopedics;  Laterality: Right;  . Total knee arthroplasty Right 09/06/2015    Procedure: RIGHT TOTAL KNEE ARTHROPLASTY;  Surgeon: Kathryne Hitch, MD;  Location: [facility];  Service: Orthopedics;  Laterality: Right;    There were no vitals filed for this visit.  Visit Diagnosis:  Stiffness of right knee, not elsewhere classified  Muscle weakness (generalized)  Other abnormalities of gait and mobility  Localized edema      Subjective Assessment - 09/25/15 1121    Subjective Pt reports compliance with HEP. Pain is 6/10 today. Pt took pain meds 30 mins before arrival for PT.   Currently in Pain? Yes   Pain Score 6    Pain Location Knee   Pain Orientation Right   Pain Descriptors / Indicators Aching;Sore   Pain Type Surgical pain    Pain Onset 1 to 4 weeks ago   Pain Frequency Constant            [facility] PT Assessment - 09/25/15 0001    AROM   Right Knee Extension -10   Right Knee Flexion 79                     [facility] Adult PT Treatment/Exercise - 09/25/15 0001    Exercises   Exercises Knee/Hip   Knee/Hip Exercises: Stretches   Active Hamstring Stretch 3 reps;30 seconds   Knee: Self-Stretch to increase Flexion 3 reps;20 seconds   Knee/Hip Exercises: Aerobic   Stationary Bike 6 mins   1/2 rev   Knee/Hip Exercises: Seated   Long Arc Quad 10 reps   Heel Slides 10 reps   Marching 10 reps   Knee/Hip Exercises: Supine   Quad Sets 10 reps   Heel Slides 10 reps   Heel Slides Limitations 10 x with strap and 10 x without strap   Straight Leg Raises 5 reps   Straight Leg Raises Limitations AAROM from PT   Modalities   Modalities Cryotherapy   Cryotherapy   Number Minutes Cryotherapy 10 Minutes  elevated and resting into knee ext -only tolerated for   Cryotherapy Location Knee   Type of Cryotherapy Ice pack  elevated.  PT Education - 09/25/15 1122    Education provided Yes   Education Details Seated HS stretch, heel slide supine and seated, LAQ, marches, QS, SLR, AP   Person(s) Educated Patient   Methods Explanation;Handout   Comprehension Verbalized understanding          PT Short Term Goals - 09/24/15 1617    PT SHORT TERM GOAL #1   Title pt will be I with initial HEP (10/25/2015)   Time 4   Period Weeks   Status New   PT SHORT TERM GOAL #2   Title pt will improve her knee extension to </= -13 degrees and flexion to >/= 80 degrees with </= 5/10 pain to assist with improved knee mobility (10/25/2015)   Time 4   Period Weeks   Status New   PT SHORT TERM GOAL #3   Title she will improve her R hip/knee strength to >/= 4-/5 with </= 5/10 pain to assist with functional progression (10/25/2015)   Time 4   Period Weeks   Status New   PT SHORT TERM GOAL #4    Title she will be able to walk >/= 15 min with LRAD with </= 5/10 pain with +1 SBA to promote safety and improve functional gait (10/25/2015)   Time 4   Period Weeks   Status New           PT Long Term Goals - 09/24/15 1620    PT LONG TERM GOAL #1   Title pt will be I with all HEP as of last visit (11/19/2015)   Time 8   Period Weeks   Status New   PT LONG TERM GOAL #2   Title pt will improve her knee extension to </= -5 degrees and flexion to >/= 120 degrees to assist with functional and efficient gait pattern (11/19/2015)   Time 8   Period Weeks   Status New   PT LONG TERM GOAL #3   Title pt will improve her R hip/ knee strength to >/= 4/5 with </= 2/10 pain to assist with safety with prolonged walking and standing activities (11/19/2015)   Time 8   Period Weeks   Status New   PT LONG TERM GOAL #4   Title pt willbe able to walk for >/= 20  minutes with no AD with </= 2/10 pain to assist with functional endurance (11/19/2015)   Time 8   Period Weeks   Status New   PT LONG TERM GOAL #5   Title pt will improve her FOTO score to >/= 50 at discharge to demonstrate improvement in function at discharge (11/19/2015)   Time 8   Period Weeks   Status New               Plan - 09/25/15 1125    Clinical Impression Statement Poor quad activation and unable to perform LAQ without assist. Added HS stretch in sitting to promote knee ext. R knee AAROM today measures 10- 79 degrees seated. Reviewed general HEP for TKA and pt required VCs for all ther ex.    PT Next Visit Plan assess/ response to HEP, AROM/ PROM, stretching, Mobs, bike vs nu-step, VASO, hip/knee strengthening   PT Home Exercise Plan heel slides with strap, hamstring stretch, SLR, SAQ, Seated HS stretch, heel slide supine and seated, LAQ, marches, QS, SLR, AP   Consulted and Agree with Plan of Care Patient        Problem List Patient Active Problem List  Diagnosis Date Noted  . Osteoarthritis of right knee  09/06/2015  . Status post total right knee replacement 09/06/2015  . Cough 09/14/2012    Haze RushingJessica Jomo Forand, PT 09/25/2015, 12:15 PM  [facility] [address] [place], [place], [address] Phone: [phone]   Fax:  [phone]  Name: Chelsea Wheeler MRN: [id number] Date of Birth: 08/14/1960

## 2015-09-26 ENCOUNTER — Ambulatory Visit: Payer: BC Managed Care – PPO | Admitting: Physical Therapy

## 2015-09-26 DIAGNOSIS — M25661 Stiffness of right knee, not elsewhere classified: Secondary | ICD-10-CM | POA: Diagnosis not present

## 2015-09-26 DIAGNOSIS — R600 Localized edema: Secondary | ICD-10-CM

## 2015-09-26 DIAGNOSIS — R2689 Other abnormalities of gait and mobility: Secondary | ICD-10-CM

## 2015-09-26 DIAGNOSIS — M6281 Muscle weakness (generalized): Secondary | ICD-10-CM

## 2015-09-26 NOTE — Therapy (Signed)
[facility] Outpatient Rehabilitation [facility] [address] [place], [place], [address] Phone: [phone]   Fax:  [phone]  Physical Therapy Treatment  Patient Details  Name: Chelsea Wheeler MRN: [id number] Date of Birth: 04-02-1961 Referring Provider: Allie Bossier MD  Encounter Date: 09/26/2015      PT End of Session - 09/26/15 1543    Visit Number 3   Number of Visits 16   Date for PT Re-Evaluation 11/19/15   Authorization Type BC[employer]   PT Start Time 1330   PT Stop Time 1418   PT Time Calculation (min) 48 min   Activity Tolerance Patient tolerated treatment well   Behavior During Therapy [facility] for tasks assessed/performed      Past Medical History  Diagnosis Date  . Seasonal allergies   . Hypertension   . Arthritis     knee  . Claustrophobia   . GERD (gastroesophageal reflux disease)     Past Surgical History  Procedure Laterality Date  . Tubal ligation  08-31-06  . Endometrial ablation  2007  . Knee arthroscopy Right 03/22/2015    Procedure: ARTHROSCOPY KNEE, partial lateral meniscectomy, microfracture, chondraplasty;  Surgeon: Erin Sons, MD;  Location: [facility] ORS;  Service: Orthopedics;  Laterality: Right;  . Total knee arthroplasty Right 09/06/2015    Procedure: RIGHT TOTAL KNEE ARTHROPLASTY;  Surgeon: Kathryne Hitch, MD;  Location: [facility];  Service: Orthopedics;  Laterality: Right;    There were no vitals filed for this visit.  Visit Diagnosis:  Stiffness of right knee, not elsewhere classified  Muscle weakness (generalized)  Other abnormalities of gait and mobility  Localized edema      Subjective Assessment - 09/26/15 1338    Subjective "I am feeling a little more sore today"    Currently in Pain? Yes   Pain Score 4    Pain Location Knee   Pain Orientation Right   Pain Descriptors / Indicators Aching;Sore   Pain Type Surgical pain   Pain Onset 1 to 4 weeks ago                         [facility]  Adult PT Treatment/Exercise - 09/26/15 1325    Knee/Hip Exercises: Stretches   Active Hamstring Stretch 2 reps;30 seconds   Hip Flexor Stretch 2 reps;30 seconds   Knee/Hip Exercises: Aerobic   Stationary Bike 6 mins 1/2 revolutions   Knee/Hip Exercises: Standing   Other Standing Knee Exercises retro stepping    Knee/Hip Exercises: Supine   Quad Sets 2 sets;10 reps  both legs to get R quad activated   Cryotherapy   Number Minutes Cryotherapy 5 Minutes   Cryotherapy Location Knee   Type of Cryotherapy Ice pack  pt reported only wanted to 5 min of ice   Manual Therapy   Manual Therapy Joint mobilization;Passive ROM   Joint Mobilization grade 2 A>P tibiofemoral mobs  to increase flexion   Passive ROM knee flexion with arm in popliteal foss with gentle osscillations                PT Education - 09/25/15 1122    Education provided Yes   Education Details Seated HS stretch, heel slide supine and seated, LAQ, marches, QS, SLR, AP   Person(s) Educated Patient   Methods Explanation;Handout   Comprehension Verbalized understanding          PT Short Term Goals - 09/26/15 1546    PT SHORT TERM GOAL #1  Period Weeks           PT Long Term Goals - 09/24/15 1620    PT LONG TERM GOAL #1   Title pt will be I with all HEP as of last visit (11/19/2015)   Time 8   Period Weeks   Status New   PT LONG TERM GOAL #2   Title pt will improve her knee extension to </= -5 degrees and flexion to >/= 120 degrees to assist with functional and efficient gait pattern (11/19/2015)   Time 8   Period Weeks   Status New   PT LONG TERM GOAL #3   Title pt will improve her R hip/ knee strength to >/= 4/5 with </= 2/10 pain to assist with safety with prolonged walking and standing activities (11/19/2015)   Time 8   Period Weeks   Status New   PT LONG TERM GOAL #4   Title pt willbe able to walk for >/= 20  minutes with no AD with </= 2/10 pain to assist with functional endurance (11/19/2015)    Time 8   Period Weeks   Status New   PT LONG TERM GOAL #5   Title pt will improve her FOTO score to >/= 50 at discharge to demonstrate improvement in function at discharge (11/19/2015)   Time 8   Period Weeks   Status New               Plan - 09/26/15 1544    Clinical Impression Statement Clarisse GougeBridget reports that she is feeling sore from the previous session. Worked on manual to increase flexion, and muscle activation tecniques using retro-stepping technique which worked well. utlized ice post session for sorness but pt only wanted to do it for 5 min.    PT Next Visit Plan  AROM/ PROM, stretching, Mobs, bike vs nu-step, hip/knee strengthening   Consulted and Agree with Plan of Care Patient        Problem List Patient Active Problem List   Diagnosis Date Noted  . Osteoarthritis of right knee 09/06/2015  . Status post total right knee replacement 09/06/2015  . Cough 09/14/2012   Lulu RidingKristoffer Leamon PT, DPT, LAT, ATC  09/26/2015  3:47 PM      [facility] [address] [place], [place], [address] Phone: [phone]   Fax:  [phone]  Name: Lenox AhrBridget M Armitage MRN: [id number] Date of Birth: 03-11-1961

## 2015-09-30 ENCOUNTER — Ambulatory Visit: Payer: BC Managed Care – PPO | Attending: Orthopaedic Surgery | Admitting: Physical Therapy

## 2015-09-30 DIAGNOSIS — M25661 Stiffness of right knee, not elsewhere classified: Secondary | ICD-10-CM | POA: Insufficient documentation

## 2015-09-30 DIAGNOSIS — M25562 Pain in left knee: Secondary | ICD-10-CM | POA: Insufficient documentation

## 2015-09-30 DIAGNOSIS — R600 Localized edema: Secondary | ICD-10-CM | POA: Insufficient documentation

## 2015-09-30 DIAGNOSIS — M6281 Muscle weakness (generalized): Secondary | ICD-10-CM | POA: Diagnosis present

## 2015-09-30 DIAGNOSIS — R2689 Other abnormalities of gait and mobility: Secondary | ICD-10-CM | POA: Diagnosis present

## 2015-09-30 NOTE — Therapy (Signed)
[facility] Outpatient Rehabilitation [facility] [address] [place], [place], [address] Phone: [phone]   Fax:  [phone]  Physical Therapy Treatment  Patient Details  Name: Chelsea Wheeler MRN: [id number] Date of Birth: 11-17-1960 Referring Provider: Allie Bossier MD  Encounter Date: 09/30/2015      PT End of Session - 09/30/15 1024    Visit Number 4   Number of Visits 16   Date for PT Re-Evaluation 11/19/15   PT Start Time 1018   PT Stop Time 1114   PT Time Calculation (min) 56 min   Activity Tolerance Patient tolerated treatment well   Behavior During Therapy [facility] for tasks assessed/performed      Past Medical History  Diagnosis Date  . Seasonal allergies   . Hypertension   . Arthritis     knee  . Claustrophobia   . GERD (gastroesophageal reflux disease)     Past Surgical History  Procedure Laterality Date  . Tubal ligation  08-31-06  . Endometrial ablation  2007  . Knee arthroscopy Right 03/22/2015    Procedure: ARTHROSCOPY KNEE, partial lateral meniscectomy, microfracture, chondraplasty;  Surgeon: Erin Sons, MD;  Location: [facility] ORS;  Service: Orthopedics;  Laterality: Right;  . Total knee arthroplasty Right 09/06/2015    Procedure: RIGHT TOTAL KNEE ARTHROPLASTY;  Surgeon: Kathryne Hitch, MD;  Location: [facility];  Service: Orthopedics;  Laterality: Right;    There were no vitals filed for this visit.  Visit Diagnosis:  Localized edema  Other abnormalities of gait and mobility  Muscle weakness (generalized)  Stiffness of right knee, not elsewhere classified      Subjective Assessment - 09/30/15 1832    Subjective I am weaning from medication.  I did take meds before today's session.  I do my exercises all the time.   Currently in Pain? Yes   Pain Score 4    Pain Location Knee   Pain Orientation Right;Lateral;Anterior   Pain Descriptors / Indicators Aching;Sore   Pain Frequency Constant   Aggravating Factors  bending,   riding in the car   Pain Relieving Factors ice, relax, medication            [facility] PT Assessment - 09/30/15 0001    PROM   Right Knee Extension -12   Right Knee Flexion 75                     [facility] Adult PT Treatment/Exercise - 09/30/15 0001    Ambulation/Gait   Gait Comments Pregait exercises consisted of weight shifting,  trendelengurg work,  hands slide up wall with forward weight shifting and hip flexion,  10 reps each   Knee/Hip Exercises: Stretches   Other Knee/Hip Stretches standind step stretch 10 X 10   Knee/Hip Exercises: Aerobic   Nustep 6 minutes legs only   Knee/Hip Exercises: Standing   Forward Step Up --  10 reps. 6 inchesmoderate use of hands   Knee/Hip Exercises: Seated   Heel Slides 10 reps   Knee/Hip Exercises: Supine   Heel Slides 10 reps  with strap   Straight Leg Raises 10 reps  off high bolster   Cryotherapy   Number Minutes Cryotherapy 10 Minutes   Cryotherapy Location Knee   Type of Cryotherapy --  cold pack, leg elevated   Manual Therapy   Manual therapy comments scar tissue mobilization gentle   Joint Mobilization grade 2 mobilization with tibia rotations to work on flexion and extension.   Passive ROM flexion  and extension with patellar glides.                     PT Short Term Goals - 09/30/15 1845    PT SHORT TERM GOAL #1   Title pt will be I with initial HEP (10/25/2015)   Time 4   Period Weeks   Status On-going   PT SHORT TERM GOAL #2   Title pt will improve her knee extension to </= -13 degrees and flexion to >/= 80 degrees with </= 5/10 pain to assist with improved knee mobility (10/25/2015)   Time 4   Period Weeks   Status On-going   PT SHORT TERM GOAL #3   Title she will improve her R hip/knee strength to >/= 4-/5 with </= 5/10 pain to assist with functional progression (10/25/2015)   Time 4   Period Weeks   Status On-going   PT SHORT TERM GOAL #4   Title she will be able to walk >/= 15 min with LRAD  with </= 5/10 pain with +1 SBA to promote safety and improve functional gait (10/25/2015)   Time 4   Period Weeks   Status Unable to assess           PT Long Term Goals - 09/24/15 1620    PT LONG TERM GOAL #1   Title pt will be I with all HEP as of last visit (11/19/2015)   Time 8   Period Weeks   Status New   PT LONG TERM GOAL #2   Title pt will improve her knee extension to </= -5 degrees and flexion to >/= 120 degrees to assist with functional and efficient gait pattern (11/19/2015)   Time 8   Period Weeks   Status New   PT LONG TERM GOAL #3   Title pt will improve her R hip/ knee strength to >/= 4/5 with </= 2/10 pain to assist with safety with prolonged walking and standing activities (11/19/2015)   Time 8   Period Weeks   Status New   PT LONG TERM GOAL #4   Title pt willbe able to walk for >/= 20  minutes with no AD with </= 2/10 pain to assist with functional endurance (11/19/2015)   Time 8   Period Weeks   Status New   PT LONG TERM GOAL #5   Title pt will improve her FOTO score to >/= 50 at discharge to demonstrate improvement in function at discharge (11/19/2015)   Time 8   Period Weeks   Status New               Plan - 09/30/15 1843    Clinical Impression Statement Patient will continue to benifit frim skilled PT to address:  Stiffness in right knee, not elsewhere classified, , other abnormalities of gait, muscle weakness(generalized), ROM gradually improving with flexion.   3/10 pain post exercise  prior to cold.   PT Next Visit Plan  AROM/ PROM, stretching, Mobs, bike vs nu-step, hip/knee strengthening   PT Home Exercise Plan continue current   Consulted and Agree with Plan of Care Patient        Problem List Patient Active Problem List   Diagnosis Date Noted  . Osteoarthritis of right knee 09/06/2015  . Status post total right knee replacement 09/06/2015  . Cough 09/14/2012    [name],[name] 09/30/2015, 6:46 PM  [facility] [address] [place], [place], [address] Phone: [phone]  Fax:  [phone]  Name: Chelsea Wheeler MRN: [id number] Date of Birth: 23-Sep-1960    Liz Beach, PTA 09/30/2015 6:46 PM Phone: [phone] Fax: [phone]

## 2015-10-02 ENCOUNTER — Ambulatory Visit: Payer: BC Managed Care – PPO | Admitting: Physical Therapy

## 2015-10-02 DIAGNOSIS — M25661 Stiffness of right knee, not elsewhere classified: Secondary | ICD-10-CM

## 2015-10-02 DIAGNOSIS — R2689 Other abnormalities of gait and mobility: Secondary | ICD-10-CM

## 2015-10-02 DIAGNOSIS — M6281 Muscle weakness (generalized): Secondary | ICD-10-CM

## 2015-10-02 DIAGNOSIS — R600 Localized edema: Secondary | ICD-10-CM

## 2015-10-02 NOTE — Therapy (Signed)
[facility] [address] [place], [place], [address] Phone: [phone]   Fax:  [phone]  Physical Therapy Treatment  Patient Details  Name: Chelsea Wheeler MRN: [id number] Date of Birth: 21-Feb-1961 Referring Provider: Allie Bossierhris Blackman MD  Encounter Date: 10/02/2015      PT End of Session - 10/02/15 1327    Visit Number 5   Number of Visits 16   Date for PT Re-Evaluation 11/19/15   PT Start Time 1017   PT Stop Time 1117   PT Time Calculation (min) 60 min   Activity Tolerance Patient tolerated treatment well   Behavior During Therapy [facility] for tasks assessed/performed      Past Medical History  Diagnosis Date  . Seasonal allergies   . Hypertension   . Arthritis     knee  . Claustrophobia   . GERD (gastroesophageal reflux disease)     Past Surgical History  Procedure Laterality Date  . Tubal ligation  08-31-06  . Endometrial ablation  2007  . Knee arthroscopy Right 03/22/2015    Procedure: ARTHROSCOPY KNEE, partial lateral meniscectomy, microfracture, chondraplasty;  Surgeon: Erin SonsHarold Kernodle, MD;  Location: [facility] ORS;  Service: Orthopedics;  Laterality: Right;  . Total knee arthroplasty Right 09/06/2015    Procedure: RIGHT TOTAL KNEE ARTHROPLASTY;  Surgeon: Kathryne Hitchhristopher Y Blackman, MD;  Location: [facility];  Service: Orthopedics;  Laterality: Right;    There were no vitals filed for this visit.  Visit Diagnosis:  Other abnormalities of gait and mobility  Muscle weakness (generalized)  Stiffness of right knee, not elsewhere classified  Localized edema      Subjective Assessment - 10/02/15 1023    Subjective 3.5/10 pain.            [facility] PT Assessment - 10/02/15 0001    PROM   Right Knee Flexion 80                     [facility] Adult PT Treatment/Exercise - 10/02/15 0001    Ambulation/Gait   Ambulation/Gait Yes   Gait Comments gait with cane, cues for level and steps.  she was able to ascend and  descend steps with step over step with use of rails and devialions.  ,     Knee/Hip Exercises: Stretches   Active Hamstring Stretch Limitations 3 X30    Other Knee/Hip Stretches walker stretch 15 X2 gaining 1.5 inches of flexion with floor measurement.  Estimated to be more than 90 degrees.    Other Knee/Hip Stretches standing step ups 10 X 6 onches, 2 hands needed.,     Knee/Hip Exercises: Standing   Forward Step Up 15 reps   Knee/Hip Exercises: Seated   Heel Slides 10 reps   Sit to Sand --  5X   Cryotherapy   Number Minutes Cryotherapy 10 Minutes   Cryotherapy Location Knee   Type of Cryotherapy --  cold pack   Manual Therapy   Manual therapy comments scar tissue mobilization,  patelar mobilization.  movement improved   Joint Mobilization with strap for flexion with    Passive ROM flexion and extension with patellar glides.                   PT Education - 10/02/15 1326    Education provided Yes   Education Details how to work on [employer]ion while watching TV   Person(s) Educated Patient   Methods Explanation;Demonstration   Comprehension Verbalized understanding  PT Short Term Goals - 09/30/15 1845    PT SHORT TERM GOAL #1   Title pt will be I with initial HEP (10/25/2015)   Time 4   Period Weeks   Status On-going   PT SHORT TERM GOAL #2   Title pt will improve her knee extension to </= -13 degrees and flexion to >/= 80 degrees with </= 5/10 pain to assist with improved knee mobility (10/25/2015)   Time 4   Period Weeks   Status On-going   PT SHORT TERM GOAL #3   Title she will improve her R hip/knee strength to >/= 4-/5 with </= 5/10 pain to assist with functional progression (10/25/2015)   Time 4   Period Weeks   Status On-going   PT SHORT TERM GOAL #4   Title she will be able to walk >/= 15 min with LRAD with </= 5/10 pain with +1 SBA to promote safety and improve functional gait (10/25/2015)   Time 4   Period Weeks   Status Unable to assess            PT Long Term Goals - 09/24/15 1620    PT LONG TERM GOAL #1   Title pt will be I with all HEP as of last visit (11/19/2015)   Time 8   Period Weeks   Status New   PT LONG TERM GOAL #2   Title pt will improve her knee extension to </= -5 degrees and flexion to >/= 120 degrees to assist with functional and efficient gait pattern (11/19/2015)   Time 8   Period Weeks   Status New   PT LONG TERM GOAL #3   Title pt will improve her R hip/ knee strength to >/= 4/5 with </= 2/10 pain to assist with safety with prolonged walking and standing activities (11/19/2015)   Time 8   Period Weeks   Status New   PT LONG TERM GOAL #4   Title pt willbe able to walk for >/= 20  minutes with no AD with </= 2/10 pain to assist with functional endurance (11/19/2015)   Time 8   Period Weeks   Status New   PT LONG TERM GOAL #5   Title pt will improve her FOTO score to >/= 50 at discharge to demonstrate improvement in function at discharge (11/19/2015)   Time 8   Period Weeks   Status New               Plan - 10/02/15 1327    Clinical Impression Statement Patient will continue to benifit from skilled PT to address:  Stiffness in right knee,not elsewhere classified, other abnormalities of gait, muscle weakness(generalized) .  Knee demonstrated 5 degrees of flexion than last visit. AA ROM.   PT Next Visit Plan review walker stretch,  try wall sits, step ups, terminal knee extension work   PT Home Exercise Plan walker stretch and knee extension stretch   Consulted and Agree with Plan of Care Patient        Problem List Patient Active Problem List   Diagnosis Date Noted  . Osteoarthritis of right knee 09/06/2015  . Status post total right knee replacement 09/06/2015  . Cough 09/14/2012    Chelsea Wheeler 10/02/2015, 6:30 PM  [facility] [address] [place], [place], [address] Phone: [phone]   Fax:  [phone]  Name: Chelsea Wheeler MRN: [id number] Date of Birth: 09/02/1960    Liz Beach, PTA 10/02/2015 6:30  PM Phone: [phone] Fax: [phone]

## 2015-10-03 ENCOUNTER — Ambulatory Visit: Payer: BC Managed Care – PPO | Admitting: Physical Therapy

## 2015-10-03 DIAGNOSIS — R600 Localized edema: Secondary | ICD-10-CM

## 2015-10-03 DIAGNOSIS — R2689 Other abnormalities of gait and mobility: Secondary | ICD-10-CM

## 2015-10-03 DIAGNOSIS — M25661 Stiffness of right knee, not elsewhere classified: Secondary | ICD-10-CM

## 2015-10-03 NOTE — Therapy (Signed)
[facility] [address] [place], [place], [address] Phone: [phone]   Fax:  [phone]  Physical Therapy Treatment  Patient Details  Name: Chelsea Wheeler MRN: [id number] Date of Birth: January 30, 1961 Referring Provider: Allie Bossierhris Blackman MD  Encounter Date: 10/03/2015      PT End of Session - 10/03/15 1750    Visit Number 6   Number of Visits 16   Date for PT Re-Evaluation 11/19/15   PT Start Time 1417   PT Stop Time 1510   PT Time Calculation (min) 53 min   Activity Tolerance Patient tolerated treatment well   Behavior During Therapy [facility] for tasks assessed/performed      Past Medical History  Diagnosis Date  . Seasonal allergies   . Hypertension   . Arthritis     knee  . Claustrophobia   . GERD (gastroesophageal reflux disease)     Past Surgical History  Procedure Laterality Date  . Tubal ligation  08-31-06  . Endometrial ablation  2007  . Knee arthroscopy Right 03/22/2015    Procedure: ARTHROSCOPY KNEE, partial lateral meniscectomy, microfracture, chondraplasty;  Surgeon: Erin SonsHarold Kernodle, MD;  Location: [facility] ORS;  Service: Orthopedics;  Laterality: Right;  . Total knee arthroplasty Right 09/06/2015    Procedure: RIGHT TOTAL KNEE ARTHROPLASTY;  Surgeon: Kathryne Hitchhristopher Y Blackman, MD;  Location: [facility];  Service: Orthopedics;  Laterality: Right;    There were no vitals filed for this visit.  Visit Diagnosis:  Other abnormalities of gait and mobility  Localized edema  Stiffness of right knee, not elsewhere classified      Subjective Assessment - 10/03/15 1517    Subjective 4.5/10  Knee.  Knee swells after and during exercise.  It does OK then it poofs.  She works on her exercises all the time   Currently in Pain? Yes   Pain Score 5    Pain Location Knee   Pain Orientation Right;Anterior;Lateral   Pain Descriptors / Indicators Aching;Sore   Aggravating Factors  exercise, increased time on feet.   Pain  Relieving Factors ice rest, elevation,  massage                         [facility] Adult PT Treatment/Exercise - 10/03/15 0001    Knee/Hip Exercises: Stretches   Other Knee/Hip Stretches walker stretch 3 sets of 10,    Knee/Hip Exercises: Standing   Forward Step Up Limitations 10 X   Wall Squat 10 reps   SLS with Vectors Single leg stand with LT toe taps 3 ways 10 X , encouraged no hands.    Cryotherapy   Number Minutes Cryotherapy 10 Minutes   Cryotherapy Location Knee   Type of Cryotherapy --  cold pack, leg elevated   Electrical Stimulation   Electrical Stimulation Location thigh   Electrical Stimulation Action Research officer, trade unionussian    Electrical Stimulation Parameters to tolerance   Electrical Stimulation Goals Neuromuscular facilitation   Manual Therapy   Manual therapy comments scar tissue mobilization,  patelar mobilization.  movement improved   Passive ROM flexion and extension with patellar glides.                   PT Education - 10/03/15 1533    Education Details wall sit   Person(s) Educated Patient   Methods Explanation;Demonstration;Verbal cues;Handout   Comprehension Verbalized understanding;Returned demonstration          PT Short Term Goals - 09/30/15 1845  PT SHORT TERM GOAL #1   Title pt will be I with initial HEP (10/25/2015)   Time 4   Period Weeks   Status On-going   PT SHORT TERM GOAL #2   Title pt will improve her knee extension to </= -13 degrees and flexion to >/= 80 degrees with </= 5/10 pain to assist with improved knee mobility (10/25/2015)   Time 4   Period Weeks   Status On-going   PT SHORT TERM GOAL #3   Title she will improve her R hip/knee strength to >/= 4-/5 with </= 5/10 pain to assist with functional progression (10/25/2015)   Time 4   Period Weeks   Status On-going   PT SHORT TERM GOAL #4   Title she will be able to walk >/= 15 min with LRAD with </= 5/10 pain with +1 SBA to promote safety and improve functional gait  (10/25/2015)   Time 4   Period Weeks   Status Unable to assess           PT Long Term Goals - 09/24/15 1620    PT LONG TERM GOAL #1   Title pt will be I with all HEP as of last visit (11/19/2015)   Time 8   Period Weeks   Status New   PT LONG TERM GOAL #2   Title pt will improve her knee extension to </= -5 degrees and flexion to >/= 120 degrees to assist with functional and efficient gait pattern (11/19/2015)   Time 8   Period Weeks   Status New   PT LONG TERM GOAL #3   Title pt will improve her R hip/ knee strength to >/= 4/5 with </= 2/10 pain to assist with safety with prolonged walking and standing activities (11/19/2015)   Time 8   Period Weeks   Status New   PT LONG TERM GOAL #4   Title pt willbe able to walk for >/= 20  minutes with no AD with </= 2/10 pain to assist with functional endurance (11/19/2015)   Time 8   Period Weeks   Status New   PT LONG TERM GOAL #5   Title pt will improve her FOTO score to >/= 50 at discharge to demonstrate improvement in function at discharge (11/19/2015)   Time 8   Period Weeks   Status New               Plan - 10/03/15 1751    Clinical Impression Statement Knee stiffer today.  with flexion around 80 degrees. She was able to tolerate more closed chain exercises,  Improved quad contraction with E- Stim.  Patient will continue to benifit fron skilled PT for the treatment of:  weakness ( generalized,) , other abnormalities of gait not otherwise specified, localized edema.  Scar tissue less puckered,   PT Next Visit Plan wall slides,  Nu step or bike,  mobilization   PT Home Exercise Plan wall sit   Consulted and Agree with Plan of Care Patient        Problem List Patient Active Problem List   Diagnosis Date Noted  . Osteoarthritis of right knee 09/06/2015  . Status post total right knee replacement 09/06/2015  . Cough 09/14/2012    [name],[name] 10/03/2015, 5:58 PM  [facility] [address] [place], [place], [address] Phone: [phone]   Fax:  [phone]  Name: Chelsea Wheeler MRN: [id number] Date of Birth: 06-11-1961    Liz Beach,  PTA 10/03/2015 5:58 PM Phone: [phone] Fax: [phone]

## 2015-10-03 NOTE — Patient Instructions (Signed)
Wall Sit   0-10 Back against wall, slide down so knees are challanged.  Hold _1___ seconds. Do _1-3___ sets. Complete _10___ repetitions.  http://st.exer.us/295   Copyright  VHI. All rights reserved.

## 2015-10-08 ENCOUNTER — Ambulatory Visit: Payer: BC Managed Care – PPO | Admitting: Physical Therapy

## 2015-10-08 DIAGNOSIS — M25562 Pain in left knee: Secondary | ICD-10-CM

## 2015-10-08 DIAGNOSIS — R600 Localized edema: Secondary | ICD-10-CM

## 2015-10-08 DIAGNOSIS — R2689 Other abnormalities of gait and mobility: Secondary | ICD-10-CM

## 2015-10-08 DIAGNOSIS — M25661 Stiffness of right knee, not elsewhere classified: Secondary | ICD-10-CM

## 2015-10-08 DIAGNOSIS — M6281 Muscle weakness (generalized): Secondary | ICD-10-CM

## 2015-10-08 NOTE — Addendum Note (Signed)
Addended by: Milford CageLEAMON, Lunell Robart L on: 10/08/2015 12:22 PM   Modules accepted: Orders

## 2015-10-08 NOTE — Therapy (Deleted)
[place], [place], [address] Phone: [phone]   Fax:  [phone]  Physical Therapy Treatment  Patient Details  Name: Chelsea Wheeler MRN: [id number] Date of Birth: 1960-07-03 Referring Provider: Zollie Beckers MD  Encounter Date: 10/08/2015      PT End of Session - 10/08/15 1144    Visit Number 7   Number of Visits 16   Date for PT Re-Evaluation 11/19/15   PT Start Time 0904   PT Stop Time 0959   PT Time Calculation (min) 55 min   Activity Tolerance Patient tolerated treatment well   Behavior During Therapy [facility] for tasks assessed/performed      Past Medical History  Diagnosis Date  . Seasonal allergies   . Hypertension   . Arthritis     knee  . Claustrophobia   . GERD (gastroesophageal reflux disease)     Past Surgical History  Procedure Laterality Date  . Tubal ligation  08-31-06  . Endometrial ablation  2007  . Knee arthroscopy Right 03/22/2015    Procedure: ARTHROSCOPY KNEE, partial lateral meniscectomy, microfracture, chondraplasty;  Surgeon: Leanor Kail, MD;  Location: [facility] ORS;  Service: Orthopedics;  Laterality: Right;  . Total knee arthroplasty Right 09/06/2015    Procedure: RIGHT TOTAL KNEE ARTHROPLASTY;  Surgeon: Mcarthur Rossetti, MD;  Location: [facility];  Service: Orthopedics;  Laterality: Right;    There were no vitals filed for this visit.      Subjective Assessment - 10/08/15 0955    Subjective 4/10 pain today.  I used my garage door for the [facility] sits.  I am stiff today.     Currently in Pain? Yes   Pain Score 4    Pain Location Knee   Pain Orientation Right;Anterior;Lateral   Pain Descriptors / Indicators Aching;Sore   Pain Frequency Constant   Aggravating Factors  increased time on feet.  stretching   Pain Relieving Factors ice, medication,  massage   Effect of Pain on Daily Activities hard to grocery shop                         [facility]  Adult PT Treatment/Exercise - 10/08/15 1001    Ambulation/Gait   Ambulation/Gait Yes   Stairs Yes   Number of Stairs 12  4 and 6 inches step over step with deviations and 2 rails.    Gait Comments worked on stride, rotations no assistive device on level. cues   Self-Care   Self-Care --  instruction for her own unit for pain control.   Knee/Hip Exercises: Stretches   Sports administrator 3 reps;30 seconds  manual for tight quads and distal glides patella.    Knee/Hip Exercises: Aerobic   Recumbent Bike 5 minutes,  not able to make full revolution   Knee/Hip Exercises: Machines for Strengthening   Total Gym Leg Press 10 X both, right only 10 X,  small movements , sled on #9   Knee/Hip Exercises: Seated   Stool Scoot - Round Trips 42 feet  both feet   Sit to Sand 5 reps  not in a row, intermittantly   Knee/Hip Exercises: Supine   Quad Sets 5 reps  poor contraction   Heel Slides 10 reps   Heel Slides Limitations stiff   Patellar Mobs checked   Cryotherapy   Number Minutes Cryotherapy 10 Minutes   Cryotherapy Location Knee   Type of Cryotherapy --  cold pack with patient's own TENS  unit   Manual Therapy   Manual therapy comments scar tissue, PROM,, patellar glides distal for flexion    Passive ROM Flexion with tibial rotations ER passive stretching,  guarding today with stiff knee.                 PT Education - 10/08/15 1143    Education provided Yes   Education Details gait on steps   Person(s) Educated Patient   Methods Explanation   Comprehension Verbalized understanding;Returned demonstration          PT Short Term Goals - 10/08/15 1151    PT SHORT TERM GOAL #1   Title pt will be I with initial HEP (10/25/2015)   Time 4   Period Weeks   Status On-going   PT SHORT TERM GOAL #2   Title pt will improve her knee extension to </= -13 degrees and flexion to >/= 80 degrees with </= 5/10 pain to assist with improved knee mobility (10/25/2015)   Time 4   Period Weeks    Status On-going   PT SHORT TERM GOAL #3   Title she will improve her R hip/knee strength to >/= 4-/5 with </= 5/10 pain to assist with functional progression (10/25/2015)   Time 4   Period Weeks   Status Unable to assess   PT SHORT TERM GOAL #4   Title she will be able to walk >/= 15 min with LRAD with </= 5/10 pain with +1 SBA to promote safety and improve functional gait (10/25/2015)   Baseline 20 minutes with moderate to severe pain (grocery store)   Time 4   Period Weeks   Status On-going           PT Long Term Goals - 09/24/15 1620    PT LONG TERM GOAL #1   Title pt will be I with all HEP as of last visit (11/19/2015)   Time 8   Period Weeks   Status New   PT LONG TERM GOAL #2   Title pt will improve her knee extension to </= -5 degrees and flexion to >/= 120 degrees to assist with functional and efficient gait pattern (11/19/2015)   Time 8   Period Weeks   Status New   PT LONG TERM GOAL #3   Title pt will improve her R hip/ knee strength to >/= 4/5 with </= 2/10 pain to assist with safety with prolonged walking and standing activities (11/19/2015)   Time 8   Period Weeks   Status New   PT LONG TERM GOAL #4   Title pt willbe able to walk for >/= 20  minutes with no AD with </= 2/10 pain to assist with functional endurance (11/19/2015)   Time 8   Period Weeks   Status New   PT LONG TERM GOAL #5   Title pt will improve her FOTO score to >/= 50 at discharge to demonstrate improvement in function at discharge (11/19/2015)   Time 8   Period Weeks   Status New               Plan - 10/08/15 1144    Clinical Impression Statement Knee continues to be stiff. and is guarded with stretches.  She has her own TENS unit and this should be able to assist her with some of her pain.  quad sets poor, but improved with functional closed chain activities. Patient will continue to benifit from skilled PT to address weakness (generalized), other abnormalities of gait  not otherwise  specified, localized edema, pain in left knee, ,stiffness in left knee, not elsewhere classified. Pain 3/10 post exercise.  STG#1 Met.    PT Next Visit Plan wall slides,  Nu step or bike,  mobilization, terminal knee extension, step ups single leg stand,  stretching.  sit to stand,   check specific goals and measure.    PT Home Exercise Plan continue to try walking 5 minutes 3 X a day   Consulted and Agree with Plan of Care Patient      Patient will benefit from skilled therapeutic intervention in order to improve the following deficits and impairments:     Visit Diagnosis: Localized edema  Stiffness of right knee, not elsewhere classified  Other abnormalities of gait and mobility  Pain in left knee  Muscle weakness (generalized)     Problem List Patient Active Problem List   Diagnosis Date Noted  . Osteoarthritis of right knee 09/06/2015  . Status post total right knee replacement 09/06/2015  . Cough 09/14/2012    Chelsea Wheeler 10/08/2015, 11:57 AM  [facility] [address] [place], [place], [address] Phone: [phone]   Fax:  [phone]  Name: Chelsea Wheeler MRN: [id number] Date of Birth: 07/24/1960    Melvenia Needles, PTA 10/08/2015 11:57 AM Phone: [phone] Fax: [phone]

## 2015-10-08 NOTE — Therapy (Addendum)
[place], [place], [address] Phone: [phone]   Fax:  [phone]  Physical Therapy Treatment  Patient Details  Name: Chelsea Wheeler MRN: [id number] Date of Birth: [birth year] Referring Provider: Zollie Beckers MD  Encounter Date: 10/08/2015      PT End of Session - 10/08/15 1144    Visit Number 7   Number of Visits 16   Date for PT Re-Evaluation 11/19/15   PT Start Time 0904   PT Stop Time 0959   PT Time Calculation (min) 55 min   Activity Tolerance Patient tolerated treatment well   Behavior During Therapy [facility] for tasks assessed/performed      Past Medical History  Diagnosis Date  . Seasonal allergies   . Hypertension   . Arthritis     knee  . Claustrophobia   . GERD (gastroesophageal reflux disease)     Past Surgical History  Procedure Laterality Date  . Tubal ligation  08-31-06  . Endometrial ablation  2007  . Knee arthroscopy Right 03/22/2015    Procedure: ARTHROSCOPY KNEE, partial lateral meniscectomy, microfracture, chondraplasty;  Surgeon: Leanor Kail, MD;  Location: [facility] ORS;  Service: Orthopedics;  Laterality: Right;  . Total knee arthroplasty Right 09/06/2015    Procedure: RIGHT TOTAL KNEE ARTHROPLASTY;  Surgeon: Mcarthur Rossetti, MD;  Location: [facility];  Service: Orthopedics;  Laterality: Right;    There were no vitals filed for this visit.      Subjective Assessment - 10/08/15 0955    Subjective 4/10 pain today.  I used my garage door for the [facility] sits.  I am stiff today.     Currently in Pain? Yes   Pain Score 4    Pain Location Knee   Pain Orientation Right;Anterior;Lateral   Pain Descriptors / Indicators Aching;Sore   Pain Frequency Constant   Aggravating Factors  increased time on feet.  stretching   Pain Relieving Factors ice, medication,  massage   Effect of Pain on Daily Activities hard to grocery shop            [facility] PT Assessment - 10/08/15  1001    PROM   Right Knee Flexion 83                     [facility] Adult PT Treatment/Exercise - 10/08/15 1001    Ambulation/Gait   Ambulation/Gait Yes   Stairs Yes   Number of Stairs 12  4 and 6 inches step over step with deviations and 2 rails.    Gait Comments worked on stride, rotations no assistive device on level. cues   Self-Care   Self-Care --  instruction for her own unit for pain control.   Knee/Hip Exercises: Stretches   Sports administrator 3 reps;30 seconds  manual for tight quads and distal glides patella.    Knee/Hip Exercises: Aerobic   Recumbent Bike 5 minutes,  not able to make full revolution   Knee/Hip Exercises: Machines for Strengthening   Total Gym Leg Press 10 X both, right only 10 X,  small movements , sled on #9   Knee/Hip Exercises: Seated   Stool Scoot - Round Trips 42 feet  both feet   Sit to Sand 5 reps  not in a row, intermittantly   Knee/Hip Exercises: Supine   Quad Sets 5 reps  poor contraction   Heel Slides 10 reps   Heel Slides Limitations stiff   Patellar Mobs checked   Cryotherapy  Number Minutes Cryotherapy 10 Minutes   Cryotherapy Location Knee   Type of Cryotherapy --  cold pack with patient's own TENS unit   Manual Therapy   Manual therapy comments scar tissue, PROM,, patellar glides distal for flexion    Passive ROM Flexion with tibial rotations ER passive stretching,  guarding today with stiff knee.                 PT Education - 10/08/15 1143    Education provided Yes   Education Details gait on steps   Person(s) Educated Patient   Methods Explanation   Comprehension Verbalized understanding;Returned demonstration          PT Short Term Goals - 10/08/15 1151    PT SHORT TERM GOAL #1   Title pt will be I with initial HEP (10/25/2015)   Time 4   Period Weeks   Status On-going   PT SHORT TERM GOAL #2   Title pt will improve her knee extension to </= -13 degrees and flexion to >/= 80 degrees with </= 5/10  pain to assist with improved knee mobility (10/25/2015)   Time 4   Period Weeks   Status On-going   PT SHORT TERM GOAL #3   Title she will improve her R hip/knee strength to >/= 4-/5 with </= 5/10 pain to assist with functional progression (10/25/2015)   Time 4   Period Weeks   Status Unable to assess   PT SHORT TERM GOAL #4   Title she will be able to walk >/= 15 min with LRAD with </= 5/10 pain with +1 SBA to promote safety and improve functional gait (10/25/2015)   Baseline 20 minutes with moderate to severe pain (grocery store)   Time 4   Period Weeks   Status On-going           PT Long Term Goals - 09/24/15 1620    PT LONG TERM GOAL #1   Title pt will be I with all HEP as of last visit (11/19/2015)   Time 8   Period Weeks   Status New   PT LONG TERM GOAL #2   Title pt will improve her knee extension to </= -5 degrees and flexion to >/= 120 degrees to assist with functional and efficient gait pattern (11/19/2015)   Time 8   Period Weeks   Status New   PT LONG TERM GOAL #3   Title pt will improve her R hip/ knee strength to >/= 4/5 with </= 2/10 pain to assist with safety with prolonged walking and standing activities (11/19/2015)   Time 8   Period Weeks   Status New   PT LONG TERM GOAL #4   Title pt willbe able to walk for >/= 20  minutes with no AD with </= 2/10 pain to assist with functional endurance (11/19/2015)   Time 8   Period Weeks   Status New   PT LONG TERM GOAL #5   Title pt will improve her FOTO score to >/= 50 at discharge to demonstrate improvement in function at discharge (11/19/2015)   Time 8   Period Weeks   Status New               Plan - 10/08/15 1144    Clinical Impression Statement Knee continues to be stiff. and is guarded with stretches.  She has her own TENS unit and this should be able to assist her with some of her pain.  quad sets poor,  but improved with functional closed chain activities. Patient will continue to benifit from skilled  PT to address weakness (generalized), other abnormalities of gait not otherwise specified, localized edema, pain in left knee, ,stiffness in left knee, not elsewhere classified. Pain 3/10 post exercise.  STG#1 Met.    Rehab Potential Good   PT Frequency 3x / week   PT Duration 8 weeks   PT Treatment/Interventions ADLs/Self Care Home Management;Cryotherapy;Electrical Stimulation;Iontophoresis '4mg'$ /ml Dexamethasone;Therapeutic exercise;Moist Heat;Therapeutic activities;Manual techniques;Vasopneumatic Device;Dry needling;Passive range of motion;Ultrasound;Patient/family education;Balance training;Gait training;Stair training   PT Next Visit Plan wall slides,  Nu step or bike,  mobilization, terminal knee extension, step ups single leg stand,  stretching.  sit to stand,   check specific goals and measure.    PT Home Exercise Plan continue to try walking 5 minutes 3 X a day   Consulted and Agree with Plan of Care Patient      Patient will benefit from skilled therapeutic intervention in order to improve the following deficits and impairments:  Pain, Improper body mechanics, Postural dysfunction, Increased edema, Decreased strength, Decreased mobility, Difficulty walking, Decreased range of motion, Decreased activity tolerance, Hypomobility, Decreased balance  Visit Diagnosis: Localized edema - Plan: PT plan of care cert/re-cert  Stiffness of right knee, not elsewhere classified - Plan: PT plan of care cert/re-cert  Other abnormalities of gait and mobility - Plan: PT plan of care cert/re-cert  Pain in left knee - Plan: PT plan of care cert/re-cert  Muscle weakness (generalized) - Plan: PT plan of care cert/re-cert     Problem List Patient Active Problem List   Diagnosis Date Noted  . Osteoarthritis of right knee 09/06/2015  . Status post total right knee replacement 09/06/2015  . Cough 09/14/2012    Starr Lake 10/08/2015, 12:22 PM  [facility] [address] [place], [place], [address] Phone: [phone]   Fax:  [phone]  Name: Chelsea Wheeler MRN: [id number] Date of Birth: 08/25/1960    Melvenia Needles, PTA 10/08/2015 12:22 PM Phone: [phone] Fax: [phone]

## 2015-10-09 ENCOUNTER — Ambulatory Visit: Payer: BC Managed Care – PPO | Admitting: Physical Therapy

## 2015-10-09 DIAGNOSIS — M6281 Muscle weakness (generalized): Secondary | ICD-10-CM

## 2015-10-09 DIAGNOSIS — M25661 Stiffness of right knee, not elsewhere classified: Secondary | ICD-10-CM

## 2015-10-09 DIAGNOSIS — R600 Localized edema: Secondary | ICD-10-CM

## 2015-10-09 DIAGNOSIS — M25562 Pain in left knee: Secondary | ICD-10-CM

## 2015-10-09 DIAGNOSIS — R2689 Other abnormalities of gait and mobility: Secondary | ICD-10-CM

## 2015-10-09 NOTE — Therapy (Signed)
[facility] [address] [place], [place], [id number] Phone: [phone]   Fax:  [phone]  Physical Therapy Treatment  Patient Details  Name: Chelsea Wheeler MRN: [id number] Date of Birth: 01/11/1961 Referring Provider: Allie Bossierhris Blackman MD  Encounter Date: 10/09/2015      PT End of Session - 10/09/15 1312    Visit Number 8   Number of Visits 16   Date for PT Re-Evaluation 11/19/15   PT Start Time 0930   PT Stop Time 1030   PT Time Calculation (min) 60 min   Activity Tolerance Patient tolerated treatment well   Behavior During Therapy [facility] for tasks assessed/performed;Agitated      Past Medical History  Diagnosis Date  . Seasonal allergies   . Hypertension   . Arthritis     knee  . Claustrophobia   . GERD (gastroesophageal reflux disease)     Past Surgical History  Procedure Laterality Date  . Tubal ligation  08-31-06  . Endometrial ablation  2007  . Knee arthroscopy Right 03/22/2015    Procedure: ARTHROSCOPY KNEE, partial lateral meniscectomy, microfracture, chondraplasty;  Surgeon: Erin SonsHarold Kernodle, MD;  Location: [facility] ORS;  Service: Orthopedics;  Laterality: Right;  . Total knee arthroplasty Right 09/06/2015    Procedure: RIGHT TOTAL KNEE ARTHROPLASTY;  Surgeon: Kathryne Hitchhristopher Y Blackman, MD;  Location: [facility];  Service: Orthopedics;  Laterality: Right;    There were no vitals filed for this visit.      Subjective Assessment - 10/09/15 1302    Subjective Pt reported 4/10 pain today. she comes in with 2 crutches. She was stiff.    Currently in Pain? Yes   Pain Score 4    Pain Location Knee   Pain Orientation Right;Anterior;Lateral   Pain Descriptors / Indicators Aching;Sore   Aggravating Factors  increased time on her feet   Pain Relieving Factors ice medication, massage , rest            [facility] PT Assessment - 10/09/15 0001    PROM   Right Knee Flexion 92                     [facility] Adult  PT Treatment/Exercise - 10/09/15 0001    Knee/Hip Exercises: Stretches   LobbyistQuad Stretch --  .  then prone 10 X 10 with strap   Other Knee/Hip Stretches walker stretch 3 sets of 10,    Knee/Hip Exercises: Aerobic   Recumbent Bike 5 minutes,  not able to make full revolution   Knee/Hip Exercises: Supine   Quad Sets 5 reps   Heel Slides 10 reps  then resisted extension manually   Terminal Knee Extension 10 reps  5 seconds,  prone with quad set   Cryotherapy   Number Minutes Cryotherapy 15 Minutes   Cryotherapy Location Knee   Type of Cryotherapy --  cold pack   Electrical Stimulation   Electrical Stimulation Location knee   Electrical Stimulation Action IFC   Electrical Stimulation Parameters 9   Electrical Stimulation Goals Pain  with cold pack   Manual Therapy   Manual therapy comments scar tissue, PROM,, patellar glides distal for flexion    Passive ROM Flexion with tibial rotations ER passive stretching,  guarding today with stiff knee.                 PT Education - 10/08/15 1143    Education provided Yes   Education Details gait on steps   Starwood HotelsPerson(s) Educated  Patient   Methods Explanation   Comprehension Verbalized understanding;Returned demonstration          PT Short Term Goals - 10/08/15 1151    PT SHORT TERM GOAL #1   Title pt will be I with initial HEP (10/25/2015)   Time 4   Period Weeks   Status On-going   PT SHORT TERM GOAL #2   Title pt will improve her knee extension to </= -13 degrees and flexion to >/= 80 degrees with </= 5/10 pain to assist with improved knee mobility (10/25/2015)   Time 4   Period Weeks   Status On-going   PT SHORT TERM GOAL #3   Title she will improve her R hip/knee strength to >/= 4-/5 with </= 5/10 pain to assist with functional progression (10/25/2015)   Time 4   Period Weeks   Status Unable to assess   PT SHORT TERM GOAL #4   Title she will be able to walk >/= 15 min with LRAD with </= 5/10 pain with +1 SBA to promote  safety and improve functional gait (10/25/2015)   Baseline 20 minutes with moderate to severe pain (grocery store)   Time 4   Period Weeks   Status On-going           PT Long Term Goals - 09/24/15 1620    PT LONG TERM GOAL #1   Title pt will be I with all HEP as of last visit (11/19/2015)   Time 8   Period Weeks   Status New   PT LONG TERM GOAL #2   Title pt will improve her knee extension to </= -5 degrees and flexion to >/= 120 degrees to assist with functional and efficient gait pattern (11/19/2015)   Time 8   Period Weeks   Status New   PT LONG TERM GOAL #3   Title pt will improve her R hip/ knee strength to >/= 4/5 with </= 2/10 pain to assist with safety with prolonged walking and standing activities (11/19/2015)   Time 8   Period Weeks   Status New   PT LONG TERM GOAL #4   Title pt willbe able to walk for >/= 20  minutes with no AD with </= 2/10 pain to assist with functional endurance (11/19/2015)   Time 8   Period Weeks   Status New   PT LONG TERM GOAL #5   Title pt will improve her FOTO score to >/= 50 at discharge to demonstrate improvement in function at discharge (11/19/2015)   Time 8   Period Weeks   Status New               Plan - 10/09/15 1313    Clinical Impression Statement Patient upset today with change of provider.  Provider exchanged with the one she wanted. ROM improving to 92 degrees today.  Patient able to stretch quads in prone tho she was not comfortable.  She was able to walk 15 minutes  with pain at home yesterday for exercise.    PT Next Visit Plan wall slides,  Nu step or bike,  mobilization, terminal knee extension, step ups single leg stand,  stretching.  sit to stand,   check specific goals and measure.    PT Home Exercise Plan continue   Consulted and Agree with Plan of Care Patient      Patient will benefit from skilled therapeutic intervention in order to improve the following deficits and impairments:     Visit  Diagnosis: Localized edema  Stiffness of right knee, not elsewhere classified  Other abnormalities of gait and mobility  Pain in left knee  Muscle weakness (generalized)     Problem List Patient Active Problem List   Diagnosis Date Noted  . Osteoarthritis of right knee 09/06/2015  . Status post total right knee replacement 09/06/2015  . Cough 09/14/2012    Nile Prisk 10/09/2015, 1:20 PM  [facility] [address] [place], [place], [address] Phone: [phone]   Fax:  [phone]  Name: Chelsea Wheeler MRN: [id number] Date of Birth: 1960/12/16    Liz Beach, PTA 10/09/2015 1:20 PM Phone: [phone] Fax: [phone]

## 2015-10-15 ENCOUNTER — Ambulatory Visit: Payer: BC Managed Care – PPO | Admitting: Physical Therapy

## 2015-10-15 DIAGNOSIS — M6281 Muscle weakness (generalized): Secondary | ICD-10-CM

## 2015-10-15 DIAGNOSIS — R2689 Other abnormalities of gait and mobility: Secondary | ICD-10-CM

## 2015-10-15 DIAGNOSIS — M25661 Stiffness of right knee, not elsewhere classified: Secondary | ICD-10-CM

## 2015-10-15 DIAGNOSIS — R600 Localized edema: Secondary | ICD-10-CM

## 2015-10-15 DIAGNOSIS — M25562 Pain in left knee: Secondary | ICD-10-CM

## 2015-10-16 NOTE — Therapy (Signed)
[place], [place], [address] Phone: [phone]   Fax:  [phone]  Physical Therapy Treatment  Patient Details  Name: Chelsea Wheeler MRN: [id number] Date of Birth: 26-Nov-1960 Referring Provider: Zollie Beckers MD  Encounter Date: 10/15/2015      PT End of Session - 10/15/15 0917    Visit Number 9   Number of Visits 16   Date for PT Re-Evaluation 11/19/15   PT Start Time 1331   PT Stop Time 1430   PT Time Calculation (min) 59 min   Activity Tolerance Patient tolerated treatment well   Behavior During Therapy [facility] for tasks assessed/performed      Past Medical History  Diagnosis Date  . Seasonal allergies   . Hypertension   . Arthritis     knee  . Claustrophobia   . GERD (gastroesophageal reflux disease)     Past Surgical History  Procedure Laterality Date  . Tubal ligation  08-31-06  . Endometrial ablation  2007  . Knee arthroscopy Right 03/22/2015    Procedure: ARTHROSCOPY KNEE, partial lateral meniscectomy, microfracture, chondraplasty;  Surgeon: Leanor Kail, MD;  Location: [facility] ORS;  Service: Orthopedics;  Laterality: Right;  . Total knee arthroplasty Right 09/06/2015    Procedure: RIGHT TOTAL KNEE ARTHROPLASTY;  Surgeon: Mcarthur Rossetti, MD;  Location: [facility];  Service: Orthopedics;  Laterality: Right;    There were no vitals filed for this visit.      Subjective Assessment - 10/15/15 1336    Subjective 5/10 pain.  I have been working on the scar.  It is not bending as much.  I am concerened with my motion.  My pain is limiting the motion.   Currently in Pain? Yes   Pain Score 5    Pain Location Knee   Pain Orientation Right;Anterior;Lateral   Pain Descriptors / Indicators Aching;Sore   Pain Frequency Constant   Aggravating Factors  random pains shoot in the bed,  stretching with a belt.    Pain Relieving Factors ice 10 minutes                          [facility] Adult PT Treatment/Exercise - 10/15/15 1343    Knee/Hip Exercises: Stretches   Quad Stretch 10 seconds  10 X foot on step stretch   Other Knee/Hip Stretches sitting then with flexed knee tap toes at end range.   Knee/Hip Exercises: Aerobic   Recumbent Bike 5 minutes,    Knee/Hip Exercises: Standing   Wall Squat --  6 reps 10 seconds   Knee/Hip Exercises: Seated   Heel Slides 10 reps  emphasis, flexion   Knee/Hip Exercises: Supine   Quad Sets 10 reps  cues needed   Heel Slides 10 reps;2 sets  1 set with strap for assist   Bridges with Diona Foley Squeeze 10 reps   Straight Leg Raises 10 reps;2 sets   Straight Leg Raises Limitations 7 degree quad lag   Patellar Mobs non tight   Cryotherapy   Number Minutes Cryotherapy 15 Minutes   Cryotherapy Location Knee   Type of Cryotherapy --  cold pack, leg elevated.   Manual Therapy   Passive ROM Flexion and extensioin stretches, with patellar glides.                  PT Short Term Goals - 10/15/15 1404    PT SHORT TERM GOAL #1   Title pt will be  I with initial HEP (10/25/2015)   Baseline independent with exercises so far   Time 4   Period Weeks   Status Achieved   PT SHORT TERM GOAL #2   Title pt will improve her knee extension to </= -13 degrees and flexion to >/= 80 degrees with </= 5/10 pain to assist with improved knee mobility (10/25/2015)   Time 4   Period Weeks   Status Partially Met   PT SHORT TERM GOAL #3   Title she will improve her R hip/knee strength to >/= 4-/5 with </= 5/10 pain to assist with functional progression (10/25/2015)   Baseline 4/5 knee flexion/ extension within available renge.  She has a 7 degree quad lag with SLR.   Time 4   Period Weeks   Status On-going   PT SHORT TERM GOAL #4   Title she will be able to walk >/= 15 min with LRAD with </= 5/10 pain with +1 SBA to promote safety and improve functional gait (10/25/2015)   Baseline 15 minutes, crutches, moderate pain   Time 4   Period  Weeks   Status On-going           PT Long Term Goals - 09/24/15 1620    PT LONG TERM GOAL #1   Title pt will be I with all HEP as of last visit (11/19/2015)   Time 8   Period Weeks   Status New   PT LONG TERM GOAL #2   Title pt will improve her knee extension to </= -5 degrees and flexion to >/= 120 degrees to assist with functional and efficient gait pattern (11/19/2015)   Time 8   Period Weeks   Status New   PT LONG TERM GOAL #3   Title pt will improve her R hip/ knee strength to >/= 4/5 with </= 2/10 pain to assist with safety with prolonged walking and standing activities (11/19/2015)   Time 8   Period Weeks   Status New   PT LONG TERM GOAL #4   Title pt willbe able to walk for >/= 20  minutes with no AD with </= 2/10 pain to assist with functional endurance (11/19/2015)   Time 8   Period Weeks   Status New   PT LONG TERM GOAL #5   Title pt will improve her FOTO score to >/= 50 at discharge to demonstrate improvement in function at discharge (11/19/2015)   Time [place] - 10/16/15 8786    Clinical Impression Statement --Patient is doing the best she can, tho she does not tolerate stretching well.  She has 92 PROM degrees of flexion at her best.  She can walk 15 minutes.  She is ready for a cane.    PT Next Visit Plan See what MD says.  Closed chain and stretching, try gait with cane.    PT Home Exercise Plan continue.      Patient will benefit from skilled therapeutic intervention in order to improve the following deficits and impairments:  Pain, Improper body mechanics, Postural dysfunction, Increased edema, Decreased strength, Decreased mobility, Difficulty walking, Decreased range of motion, Decreased activity tolerance, Hypomobility, Decreased balance  Visit Diagnosis: Localized edema  Stiffness of right knee, not elsewhere classified  Other abnormalities of gait and mobility  Pain in left knee  Muscle weakness  (generalized)     Problem List Patient  Active Problem List   Diagnosis Date Noted  . Osteoarthritis of right knee 09/06/2015  . Status post total right knee replacement 09/06/2015  . Cough 09/14/2012    [name],[name] 10/16/2015, 9:38 AM  [facility] [address] [place], [place], [address] Phone: [phone]   Fax:  [phone]  Name: JUNEAU DOUGHMAN MRN: [id number] Date of Birth: Aug 01, 1960    Melvenia Needles, PTA 10/16/2015 9:38 AM Phone: [phone] Fax: [phone]

## 2015-10-17 ENCOUNTER — Ambulatory Visit: Payer: BC Managed Care – PPO | Admitting: Physical Therapy

## 2015-10-17 DIAGNOSIS — M25562 Pain in left knee: Secondary | ICD-10-CM

## 2015-10-17 DIAGNOSIS — M25661 Stiffness of right knee, not elsewhere classified: Secondary | ICD-10-CM

## 2015-10-17 DIAGNOSIS — M6281 Muscle weakness (generalized): Secondary | ICD-10-CM

## 2015-10-17 DIAGNOSIS — R600 Localized edema: Secondary | ICD-10-CM

## 2015-10-17 DIAGNOSIS — R2689 Other abnormalities of gait and mobility: Secondary | ICD-10-CM

## 2015-10-17 NOTE — Therapy (Signed)
[place], [place], [address] Phone: [phone]   Fax:  [phone]  Physical Therapy Treatment  Patient Details  Name: Chelsea Wheeler MRN: [id number] Date of Birth: 1961-05-04 Referring Provider: Zollie Beckers MD  Encounter Date: 10/17/2015      PT End of Session - 10/17/15 1548    Visit Number 10   Number of Visits 16   Date for PT Re-Evaluation 11/19/15   PT Start Time 1504   PT Stop Time 1555   PT Time Calculation (min) 51 min   Activity Tolerance Patient tolerated treatment well   Behavior During Therapy [facility] for tasks assessed/performed      Past Medical History  Diagnosis Date  . Seasonal allergies   . Hypertension   . Arthritis     knee  . Claustrophobia   . GERD (gastroesophageal reflux disease)     Past Surgical History  Procedure Laterality Date  . Tubal ligation  08-31-06  . Endometrial ablation  2007  . Knee arthroscopy Right 03/22/2015    Procedure: ARTHROSCOPY KNEE, partial lateral meniscectomy, microfracture, chondraplasty;  Surgeon: Leanor Kail, MD;  Location: [facility]S;  Service: Orthopedics;  Laterality: Right;  . Total knee arthroplasty Right 09/06/2015    Procedure: RIGHT TOTAL KNEE ARTHROPLASTY;  Surgeon: Mcarthur Rossetti, MD;  Location: [facility];  Service: Orthopedics;  Laterality: Right;    There were no vitals filed for this visit.      Subjective Assessment - 10/17/15 1528    Subjective 4/10.  Got up  at 2 am and worked on my knee it was bending good.  itching distal 1/3 scat.  Scar is flatter.  She is working hard on home exercises.  No MD appointment MD emergency.  May 8th next MD visit.     Currently in Pain? Yes   Pain Score 4    Pain Location Knee   Pain Orientation Right;Anterior;Lateral            [facility] PT Assessment - 10/17/15 0001    PROM   Right Knee Flexion 100  Stretching on step                     [facility] Adult PT  Treatment/Exercise - 10/17/15 0001    Ambulation/Gait   Ambulation/Gait Yes   Gait Comments continued to work on weight shifts and trunk rotations.  Improves with cues.    Knee/Hip Exercises: Stretches   Quad Stretch 10 seconds  10 seconds on step   Knee/Hip Exercises: Standing   Lateral Step Up Limitations 10, 6 inches   Forward Step Up Limitations 10 X 2 sets 6 inches.   Wall Squat 10 reps   Other Standing Knee Exercises terminal knee ball squeeze at wall   Knee/Hip Exercises: Seated   Long Arc Quad 10 reps   Long Arc Quad Weight 5 lbs.   Heel Slides 10 reps  emphasis, flexion   Cryotherapy   Number Minutes Cryotherapy 10 Minutes   Cryotherapy Location Knee   Type of Cryotherapy --  cold pack                  PT Short Term Goals - 10/15/15 1404    PT SHORT TERM GOAL #1   Title pt will be I with initial HEP (10/25/2015)   Baseline independent with exercises so far   Time 4   Period Weeks   Status Achieved   PT SHORT TERM  GOAL #2   Title pt will improve her knee extension to </= -13 degrees and flexion to >/= 80 degrees with </= 5/10 pain to assist with improved knee mobility (10/25/2015)   Time 4   Period Weeks   Status Partially Met   PT SHORT TERM GOAL #3   Title she will improve her R hip/knee strength to >/= 4-/5 with </= 5/10 pain to assist with functional progression (10/25/2015)   Baseline 4/5 knee flexion/ extension within available renge.  She has a 7 degree quad lag with SLR.   Time 4   Period Weeks   Status On-going   PT SHORT TERM GOAL #4   Title she will be able to walk >/= 15 min with LRAD with </= 5/10 pain with +1 SBA to promote safety and improve functional gait (10/25/2015)   Baseline 15 minutes, crutches, moderate pain   Time 4   Period Weeks   Status On-going           PT Long Term Goals - 09/24/15 1620    PT LONG TERM GOAL #1   Title pt will be I with all HEP as of last visit (11/19/2015)   Time 8   Period Weeks   Status New   PT  LONG TERM GOAL #2   Title pt will improve her knee extension to </= -5 degrees and flexion to >/= 120 degrees to assist with functional and efficient gait pattern (11/19/2015)   Time 8   Period Weeks   Status New   PT LONG TERM GOAL #3   Title pt will improve her R hip/ knee strength to >/= 4/5 with </= 2/10 pain to assist with safety with prolonged walking and standing activities (11/19/2015)   Time 8   Period Weeks   Status New   PT LONG TERM GOAL #4   Title pt willbe able to walk for >/= 20  minutes with no AD with </= 2/10 pain to assist with functional endurance (11/19/2015)   Time 8   Period Weeks   Status New   PT LONG TERM GOAL #5   Title pt will improve her FOTO score to >/= 50 at discharge to demonstrate improvement in function at discharge (11/19/2015)   Time 8   Period Weeks   Status New               Plan - 10/17/15 1557    Clinical Impression Statement 10th visit  FOTO  57% limitation (improved).  PROM 100 flexion.  Progress toward ROM goals.    PT Next Visit Plan Standing step stretch   PT Home Exercise Plan step stretch,    Consulted and Agree with Plan of Care Patient      Patient will benefit from skilled therapeutic intervention in order to improve the following deficits and impairments:  Pain, Improper body mechanics, Postural dysfunction, Increased edema, Decreased strength, Decreased mobility, Difficulty walking, Decreased range of motion, Decreased activity tolerance, Hypomobility, Decreased balance  Visit Diagnosis: Localized edema  Stiffness of right knee, not elsewhere classified  Other abnormalities of gait and mobility  Pain in left knee  Muscle weakness (generalized)     Problem List Patient Active Problem List   Diagnosis Date Noted  . Osteoarthritis of right knee 09/06/2015  . Status post total right knee replacement 09/06/2015  . Cough 09/14/2012    Chelsea Wheeler 10/17/2015, 4:00 PM  [facility] [address] [place], [place], [address] Phone:  [phone]   Fax:  [phone]  Name: Chelsea Wheeler MRN: [id number] Date of Birth: 10-Aug-1960    Melvenia Needles, PTA 10/17/2015 4:00 PM Phone: [phone] Fax: [phone]

## 2015-10-22 ENCOUNTER — Ambulatory Visit: Payer: BC Managed Care – PPO | Admitting: Physical Therapy

## 2015-10-22 DIAGNOSIS — M25661 Stiffness of right knee, not elsewhere classified: Secondary | ICD-10-CM

## 2015-10-22 DIAGNOSIS — M25562 Pain in left knee: Secondary | ICD-10-CM

## 2015-10-22 DIAGNOSIS — R600 Localized edema: Secondary | ICD-10-CM

## 2015-10-22 DIAGNOSIS — R2689 Other abnormalities of gait and mobility: Secondary | ICD-10-CM

## 2015-10-22 DIAGNOSIS — M6281 Muscle weakness (generalized): Secondary | ICD-10-CM

## 2015-10-22 NOTE — Therapy (Signed)
[place], [place], [address] Phone: [phone]   Fax:  [phone]  Physical Therapy Treatment  Patient Details  Name: Chelsea Wheeler MRN: [id number] Date of Birth: [birth year] Referring Provider: Zollie Beckers MD  Encounter Date: 10/22/2015      PT End of Session - 10/22/15 1833    Visit Number 11   Number of Visits 16   Date for PT Re-Evaluation 11/19/15   PT Start Time 1018   PT Stop Time 1110   PT Time Calculation (min) 52 min   Activity Tolerance Patient tolerated treatment well   Behavior During Therapy [facility] for tasks assessed/performed      Past Medical History  Diagnosis Date  . Seasonal allergies   . Hypertension   . Arthritis     knee  . Claustrophobia   . GERD (gastroesophageal reflux disease)     Past Surgical History  Procedure Laterality Date  . Tubal ligation  08-31-06  . Endometrial ablation  2007  . Knee arthroscopy Right 03/22/2015    Procedure: ARTHROSCOPY KNEE, partial lateral meniscectomy, microfracture, chondraplasty;  Surgeon: Leanor Kail, MD;  Location: [facility];  Service: Orthopedics;  Laterality: Right;  . Total knee arthroplasty Right 09/06/2015    Procedure: RIGHT TOTAL KNEE ARTHROPLASTY;  Surgeon: Mcarthur Rossetti, MD;  Location: [facility];  Service: Orthopedics;  Laterality: Right;    There were no vitals filed for this visit.      Subjective Assessment - 10/22/15 1022    Subjective pain wakes her up at night.  Works her knee all the time, invents new exercises on her own.  Needes stnd by assist getting in out of shower tub  no anti slip grips on tub bottom.     Currently in Pain? Yes   Pain Score 5    Pain Location Knee   Pain Orientation Right;Lateral;Medial   Pain Descriptors / Indicators Aching;Sore  sensitive to touch   Aggravating Factors  Stiff after siiting.                         [facility] Adult PT Treatment/Exercise -  10/22/15 1027    Knee/Hip Exercises: Stretches   Other Knee/Hip Stretches quad and anterior hip stretch over edge of mat 3 X 30 seconds   Knee/Hip Exercises: Aerobic   Nustep 7 minutes.   Knee/Hip Exercises: Machines for Strengthening   Cybex Leg Press 20 X 3 plates both, 1 leg 1 plate X10   Knee/Hip Exercises: Standing   Wall Squat 10 reps   Walking with Sports Cord cable cross 13 LBS 10 X forward/ 10 X reverse   10 x   Knee/Hip Exercises: Supine   Quad Sets 10 reps  RT , better quad contraction noted,Hamstring sets 10 X 5 sec   Straight Leg Raises 10 reps   Cryotherapy   Number Minutes Cryotherapy 10 Minutes   Cryotherapy Location Knee   Type of Cryotherapy --  cold pack                PT Education - 10/22/15 1330    Education provided Yes   Education Details quad/anterior hip stretch   Person(s) Educated Patient   Methods Explanation;Tactile cues;Verbal cues;Handout   Comprehension Verbalized understanding;Returned demonstration          PT Short Term Goals - 10/22/15 1836    PT SHORT TERM GOAL #1   Title pt will be I with  initial HEP (10/25/2015)   Status Achieved   PT SHORT TERM GOAL #2   Title pt will improve her knee extension to </= -13 degrees and flexion to >/= 80 degrees with </= 5/10 pain to assist with improved knee mobility (10/25/2015)   Time 4   Period Weeks   Status Partially Met   PT SHORT TERM GOAL #3   Title she will improve her R hip/knee strength to >/= 4-/5 with </= 5/10 pain to assist with functional progression (10/25/2015)   Time 4   Period Weeks   Status On-going   PT SHORT TERM GOAL #4   Title she will be able to walk >/= 15 min with LRAD with </= 5/10 pain with +1 SBA to promote safety and improve functional gait (10/25/2015)   Baseline 5 minutes, cane, knee stiff vs pain   Time 4   Period Weeks   Status Partially Met           PT Long Term Goals - 09/24/15 1620    PT LONG TERM GOAL #1   Title pt will be I with all HEP as  of last visit (11/19/2015)   Time 8   Period Weeks   Status New   PT LONG TERM GOAL #2   Title pt will improve her knee extension to </= -5 degrees and flexion to >/= 120 degrees to assist with functional and efficient gait pattern (11/19/2015)   Time 8   Period Weeks   Status New   PT LONG TERM GOAL #3   Title pt will improve her R hip/ knee strength to >/= 4/5 with </= 2/10 pain to assist with safety with prolonged walking and standing activities (11/19/2015)   Time 8   Period Weeks   Status New   PT LONG TERM GOAL #4   Title pt willbe able to walk for >/= 20  minutes with no AD with </= 2/10 pain to assist with functional endurance (11/19/2015)   Time 8   Period Weeks   Status New   PT LONG TERM GOAL #5   Title pt will improve her FOTO score to >/= 50 at discharge to demonstrate improvement in function at discharge (11/19/2015)   Time 8   Period Weeks   Status New               Plan - 10/22/15 1835    Clinical Impression Statement Strength and ROM continue to benifit from PT.  Gait improved with less limp and progression to a cane.    PT Next Visit Plan try cane on stairs.     PT Home Exercise Plan continue   Consulted and Agree with Plan of Care Patient      Patient will benefit from skilled therapeutic intervention in order to improve the following deficits and impairments:  Pain, Improper body mechanics, Postural dysfunction, Increased edema, Decreased strength, Decreased mobility, Difficulty walking, Decreased range of motion, Decreased activity tolerance, Hypomobility, Decreased balance  Visit Diagnosis: Localized edema  Stiffness of right knee, not elsewhere classified  Other abnormalities of gait and mobility  Pain in left knee  Muscle weakness (generalized)     Problem List Patient Active Problem List   Diagnosis Date Noted  . Osteoarthritis of right knee 09/06/2015  . Status post total right knee replacement 09/06/2015  . Cough 09/14/2012     Rameses Ou 10/22/2015, 6:38 PM  [place], [place], [address] Phone: [phone]  Fax:  [phone]  Name: Chelsea Wheeler MRN: [id number] Date of Birth: 03-31-1961    Melvenia Needles, PTA 10/22/2015 6:38 PM Phone: [phone] Fax: [phone]

## 2015-10-22 NOTE — Patient Instructions (Addendum)
From exercise drawer:  Anterior hip/quad stretch 3 X 30, 1-2 X a day

## 2015-10-24 ENCOUNTER — Ambulatory Visit: Payer: BC Managed Care – PPO | Admitting: Physical Therapy

## 2015-10-24 DIAGNOSIS — M25562 Pain in left knee: Secondary | ICD-10-CM

## 2015-10-24 DIAGNOSIS — R600 Localized edema: Secondary | ICD-10-CM

## 2015-10-24 DIAGNOSIS — M6281 Muscle weakness (generalized): Secondary | ICD-10-CM

## 2015-10-24 DIAGNOSIS — R2689 Other abnormalities of gait and mobility: Secondary | ICD-10-CM

## 2015-10-24 DIAGNOSIS — M25661 Stiffness of right knee, not elsewhere classified: Secondary | ICD-10-CM

## 2015-10-24 NOTE — Therapy (Signed)
[place], [place], [address] Phone: [phone]   Fax:  [phone]  Physical Therapy Treatment  Patient Details  Name: Chelsea Wheeler MRN: [id number] Date of Birth: 1961-05-14 Referring Provider: Zollie Beckers MD  Encounter Date: 10/24/2015    Past Medical History  Diagnosis Date  . Seasonal allergies   . Hypertension   . Arthritis     knee  . Claustrophobia   . GERD (gastroesophageal reflux disease)     Past Surgical History  Procedure Laterality Date  . Tubal ligation  08-31-06  . Endometrial ablation  2007  . Knee arthroscopy Right 03/22/2015    Procedure: ARTHROSCOPY KNEE, partial lateral meniscectomy, microfracture, chondraplasty;  Surgeon: Leanor Kail, MD;  Location: [facility];  Service: Orthopedics;  Laterality: Right;  . Total knee arthroplasty Right 09/06/2015    Procedure: RIGHT TOTAL KNEE ARTHROPLASTY;  Surgeon: Mcarthur Rossetti, MD;  Location: [facility];  Service: Orthopedics;  Laterality: Right;    There were no vitals filed for this visit.      Subjective Assessment - 10/24/15 0934    Subjective Knee is stiff.     Currently in Pain? Yes   Pain Score 3    Pain Location Knee   Pain Orientation Right;Lateral;Medial   Pain Descriptors / Indicators Aching;Sore   Pain Frequency Constant   Aggravating Factors  night pain,  stiff after sitting   Pain Relieving Factors medication, ice   Effect of Pain on Daily Activities sleeps 2 hours at a time due to pain.            [facility] PT Assessment - 10/24/15 0001    PROM   Right Knee Flexion 100  With IFC                     [facility] Adult PT Treatment/Exercise - 10/24/15 8299    Self-Care   Self-Care --  use TENS to help manage pain at home. Desentization How to   Knee/Hip Exercises: Stretches   Passive Hamstring Stretch 3 reps;30 seconds   Quad Stretch Limitations multiple reps.5-10 second holds   Knee/Hip  Exercises: Aerobic   Nustep 7 minutes   Knee/Hip Exercises: Machines for Strengthening   Cybex Leg Press 20 X , 3 plates, both, 1 leg  1plate 10 X 2  old machine   Cryotherapy   Number Minutes Cryotherapy 10 Minutes   Cryotherapy Location Knee   Type of Cryotherapy --  cold pack leg elevated   Electrical Stimulation   Electrical Stimulation Location knee   Electrical Stimulation Action IFC   Electrical Stimulation Parameters 14   Electrical Stimulation Goals Pain  during manual stretching   Manual Therapy   Manual Therapy Joint mobilization;Soft tissue mobilization;Passive ROM   Passive ROM flexion/extension with patellar glides,  gentle overpressure,, distraction, anterior glides  RT knee  did not get to hard end feel.                PT Education - 10/24/15 1139    Education provided Yes   Education Details how to desensitize scar/knee   Person(s) Educated Patient   Methods Explanation;Demonstration   Comprehension Verbalized understanding          PT Short Term Goals - 10/22/15 1836    PT SHORT TERM GOAL #1   Title pt will be I with initial HEP (10/25/2015)   Status Achieved   PT SHORT TERM GOAL #2   Title pt  will improve her knee extension to </= -13 degrees and flexion to >/= 80 degrees with </= 5/10 pain to assist with improved knee mobility (10/25/2015)   Time 4   Period Weeks   Status Partially Met   PT SHORT TERM GOAL #3   Title she will improve her R hip/knee strength to >/= 4-/5 with </= 5/10 pain to assist with functional progression (10/25/2015)   Time 4   Period Weeks   Status On-going   PT SHORT TERM GOAL #4   Title she will be able to walk >/= 15 min with LRAD with </= 5/10 pain with +1 SBA to promote safety and improve functional gait (10/25/2015)   Baseline 5 minutes, cane, knee stiff vs pain   Time 4   Period Weeks   Status Partially Met           PT Long Term Goals - 09/24/15 1620    PT LONG TERM GOAL #1   Title pt will be I with  all HEP as of last visit (11/19/2015)   Time 8   Period Weeks   Status New   PT LONG TERM GOAL #2   Title pt will improve her knee extension to </= -5 degrees and flexion to >/= 120 degrees to assist with functional and efficient gait pattern (11/19/2015)   Time 8   Period Weeks   Status New   PT LONG TERM GOAL #3   Title pt will improve her R hip/ knee strength to >/= 4/5 with </= 2/10 pain to assist with safety with prolonged walking and standing activities (11/19/2015)   Time 8   Period Weeks   Status New   PT LONG TERM GOAL #4   Title pt willbe able to walk for >/= 20  minutes with no AD with </= 2/10 pain to assist with functional endurance (11/19/2015)   Time 8   Period Weeks   Status New   PT LONG TERM GOAL #5   Title pt will improve her FOTO score to >/= 50 at discharge to demonstrate improvement in function at discharge (11/19/2015)   Time 8   Period Weeks   Status New               Plan - 10/24/15 1134    Clinical Impression Statement 100 PROM tolerated today.  Focus today was ROM.. IFC helpful.  Patrient has difficulty with stretching tolerance/ sensitivity.    PT Next Visit Plan try cane on stairs.  Do what ever you think best.     PT Home Exercise Plan Use TENS more   Consulted and Agree with Plan of Care Patient      Patient will benefit from skilled therapeutic intervention in order to improve the following deficits and impairments:  Pain, Improper body mechanics, Postural dysfunction, Increased edema, Decreased strength, Decreased mobility, Difficulty walking, Decreased range of motion, Decreased activity tolerance, Hypomobility, Decreased balance  Visit Diagnosis: Localized edema  Stiffness of right knee, not elsewhere classified  Other abnormalities of gait and mobility  Pain in left knee  Muscle weakness (generalized)     Problem List Patient Active Problem List   Diagnosis Date Noted  . Osteoarthritis of right knee 09/06/2015  . Status post  total right knee replacement 09/06/2015  . Cough 09/14/2012    [name],[name] 10/24/2015, 11:41 AM  [facility] [address] [place], [place], [address] Phone: [phone]   Fax:  [phone]  Name: Chelsea Wheeler  MRN: [id number] Date of Birth: Dec 22, 1960    Melvenia Needles, PTA 10/24/2015 11:41 AM Phone: [phone] Fax: [phone]

## 2015-10-24 NOTE — Patient Instructions (Signed)
Use TENS unit to help manage pain with sleeping,  Exercise.

## 2015-10-29 ENCOUNTER — Ambulatory Visit: Payer: BC Managed Care – PPO | Attending: Orthopaedic Surgery | Admitting: Physical Therapy

## 2015-10-29 DIAGNOSIS — R600 Localized edema: Secondary | ICD-10-CM | POA: Insufficient documentation

## 2015-10-29 DIAGNOSIS — M25562 Pain in left knee: Secondary | ICD-10-CM | POA: Diagnosis present

## 2015-10-29 DIAGNOSIS — M6281 Muscle weakness (generalized): Secondary | ICD-10-CM | POA: Insufficient documentation

## 2015-10-29 DIAGNOSIS — M25661 Stiffness of right knee, not elsewhere classified: Secondary | ICD-10-CM | POA: Insufficient documentation

## 2015-10-29 DIAGNOSIS — R2689 Other abnormalities of gait and mobility: Secondary | ICD-10-CM | POA: Diagnosis present

## 2015-10-29 NOTE — Therapy (Signed)
[place], [place], [address] Phone: [phone]   Fax:  [phone]  Physical Therapy Treatment  Patient Details  Name: Chelsea Wheeler MRN: [id number] Date of Birth: 20-Feb-1961 Referring Provider: Zollie Beckers MD  Encounter Date: 10/29/2015      PT End of Session - 10/29/15 1137    Visit Number 12   Number of Visits 16   Date for PT Re-Evaluation 11/19/15   Authorization Type [employer]   PT Start Time 2707   PT Stop Time 1104   PT Time Calculation (min) 49 min   Activity Tolerance Patient tolerated treatment well   Behavior During Therapy [facility] for tasks assessed/performed      Past Medical History  Diagnosis Date  . Seasonal allergies   . Hypertension   . Arthritis     knee  . Claustrophobia   . GERD (gastroesophageal reflux disease)     Past Surgical History  Procedure Laterality Date  . Tubal ligation  08-31-06  . Endometrial ablation  2007  . Knee arthroscopy Right 03/22/2015    Procedure: ARTHROSCOPY KNEE, partial lateral meniscectomy, microfracture, chondraplasty;  Surgeon: Leanor Kail, MD;  Location: [facility];  Service: Orthopedics;  Laterality: Right;  . Total knee arthroplasty Right 09/06/2015    Procedure: RIGHT TOTAL KNEE ARTHROPLASTY;  Surgeon: Mcarthur Rossetti, MD;  Location: [facility];  Service: Orthopedics;  Laterality: Right;    There were no vitals filed for this visit.      Subjective Assessment - 10/29/15 1021    Subjective "I've been frustrated due to the knee fluctuating in swelling"    Currently in Pain? Yes   Pain Score 2    Pain Location Knee   Pain Orientation Right;Lateral;Medial            [facility] PT Assessment - 10/29/15 0001    AROM   Right Knee Flexion 103                     [facility] Adult PT Treatment/Exercise - 10/29/15 1028    Knee/Hip Exercises: Stretches   Quad Stretch 20 seconds;4 reps  prone with strap   Knee/Hip Exercises:  Aerobic   Nustep L5 x 7 min   Knee/Hip Exercises: Seated   Hamstring Curl AROM;Strengthening;Right;2 sets;10 reps  red theraband   Cryotherapy   Number Minutes Cryotherapy 10 Minutes   Cryotherapy Location Knee   Type of Cryotherapy Ice pack   Electrical Stimulation   Electrical Stimulation Location knee   Electrical Stimulation Action IFC   Electrical Stimulation Parameters L16, 100% scan, x 10 min   Electrical Stimulation Goals Pain  in supine   Manual Therapy   Manual therapy comments manual trigger point release of quads x 3   Joint Mobilization Grade 3 A>P tibiofemoral mobs to promote flexion   Passive ROM passive flexion with pt's arm behind knee gently osscillating into flexion 5 x 20 sec                PT Education - 10/29/15 1137    Education provided Yes   Education Details anatomy of the knee and benefit of mobs to get the knee bending   Person(s) Educated Patient   Methods Explanation   Comprehension Verbalized understanding          PT Short Term Goals - 10/22/15 1836    PT SHORT TERM GOAL #1   Title pt will be I with initial HEP (10/25/2015)  Status Achieved   PT SHORT TERM GOAL #2   Title pt will improve her knee extension to </= -13 degrees and flexion to >/= 80 degrees with </= 5/10 pain to assist with improved knee mobility (10/25/2015)   Time 4   Period Weeks   Status Partially Met   PT SHORT TERM GOAL #3   Title she will improve her R hip/knee strength to >/= 4-/5 with </= 5/10 pain to assist with functional progression (10/25/2015)   Time 4   Period Weeks   Status On-going   PT SHORT TERM GOAL #4   Title she will be able to walk >/= 15 min with LRAD with </= 5/10 pain with +1 SBA to promote safety and improve functional gait (10/25/2015)   Baseline 5 minutes, cane, knee stiff vs pain   Time 4   Period Weeks   Status Partially Met           PT Long Term Goals - 09/24/15 1620    PT LONG TERM GOAL #1   Title pt will be I with all HEP  as of last visit (11/19/2015)   Time 8   Period Weeks   Status New   PT LONG TERM GOAL #2   Title pt will improve her knee extension to </= -5 degrees and flexion to >/= 120 degrees to assist with functional and efficient gait pattern (11/19/2015)   Time 8   Period Weeks   Status New   PT LONG TERM GOAL #3   Title pt will improve her R hip/ knee strength to >/= 4/5 with </= 2/10 pain to assist with safety with prolonged walking and standing activities (11/19/2015)   Time 8   Period Weeks   Status New   PT LONG TERM GOAL #4   Title pt willbe able to walk for >/= 20  minutes with no AD with </= 2/10 pain to assist with functional endurance (11/19/2015)   Time 8   Period Weeks   Status New   PT LONG TERM GOAL #5   Title pt will improve her FOTO score to >/= 50 at discharge to demonstrate improvement in function at discharge (11/19/2015)   Time 8   Period Weeks   Status New               Plan - 10/29/15 1138    Clinical Impression Statement Chelsea Wheeler continues to make progress improving her knee flexion to 103 degrees. Focused todays session on her  AROM working on mobs and manual trigger point release to promote relief of quad tightness. Used e-stim and ice post session to decrease soreness.    PT Next Visit Plan gait training, continue mobs anterior to posterior to improve fleixon, stair training trying using SPC, Modalities for pain.    Consulted and Agree with Plan of Care Patient      Patient will benefit from skilled therapeutic intervention in order to improve the following deficits and impairments:  Pain, Improper body mechanics, Postural dysfunction, Increased edema, Decreased strength, Decreased mobility, Difficulty walking, Decreased range of motion, Decreased activity tolerance, Hypomobility, Decreased balance  Visit Diagnosis: Localized edema  Stiffness of right knee, not elsewhere classified  Other abnormalities of gait and mobility  Muscle weakness  (generalized)  Pain in left knee     Problem List Patient Active Problem List   Diagnosis Date Noted  . Osteoarthritis of right knee 09/06/2015  . Status post total right knee replacement 09/06/2015  . Cough 09/14/2012  Starr Lake PT, DPT, LAT, ATC  10/29/2015  11:43 AM      [facility] [address] [place], [place], [address] Phone: [phone]   Fax:  [phone]  Name: Chelsea Wheeler MRN: [id number] Date of Birth: April 02, 1961

## 2015-10-31 ENCOUNTER — Ambulatory Visit: Payer: BC Managed Care – PPO | Admitting: Physical Therapy

## 2015-10-31 DIAGNOSIS — M25562 Pain in left knee: Secondary | ICD-10-CM

## 2015-10-31 DIAGNOSIS — R600 Localized edema: Secondary | ICD-10-CM

## 2015-10-31 DIAGNOSIS — M25661 Stiffness of right knee, not elsewhere classified: Secondary | ICD-10-CM

## 2015-10-31 DIAGNOSIS — M6281 Muscle weakness (generalized): Secondary | ICD-10-CM

## 2015-10-31 DIAGNOSIS — R2689 Other abnormalities of gait and mobility: Secondary | ICD-10-CM

## 2015-10-31 NOTE — Patient Instructions (Signed)
Remove tape if irritating 

## 2015-10-31 NOTE — Therapy (Signed)
[facility] [facility] [address] [place], [place], [address] Phone: [phone]   Fax:  [phone]  Physical Therapy Treatment  Patient Details  Name: Chelsea Wheeler MRN: [id number] Date of Birth: 1960/11/07 Referring Provider: Allie Bossier MD  Encounter Date: 10/31/2015      PT End of Session - 10/31/15 1132    Visit Number 13   Number of Visits 16   Date for PT Re-Evaluation 11/19/15   PT Start Time 0933   PT Stop Time 1025   PT Time Calculation (min) 52 min   Activity Tolerance Patient tolerated treatment well   Behavior During Therapy [facility] for tasks assessed/performed      Past Medical History  Diagnosis Date  . Seasonal allergies   . Hypertension   . Arthritis     knee  . Claustrophobia   . GERD (gastroesophageal reflux disease)     Past Surgical History  Procedure Laterality Date  . Tubal ligation  08-31-06  . Endometrial ablation  2007  . Knee arthroscopy Right 03/22/2015    Procedure: ARTHROSCOPY KNEE, partial lateral meniscectomy, microfracture, chondraplasty;  Surgeon: Erin Sons, MD;  Location: [facility];  Service: Orthopedics;  Laterality: Right;  . Total knee arthroplasty Right 09/06/2015    Procedure: RIGHT TOTAL KNEE ARTHROPLASTY;  Surgeon: Kathryne Hitch, MD;  Location: [facility];  Service: Orthopedics;  Laterality: Right;    There were no vitals filed for this visit.      Subjective Assessment - 10/31/15 0945    Subjective 4.5 back pain from twisting it pushing the washer   Currently in Pain? Yes   Pain Score 5    Pain Descriptors / Indicators --  stiff   Pain Frequency Constant   Aggravating Factors  comes and goes,  not why tight painful today   Pain Relieving Factors medication, ice                         [facility] Adult PT Treatment/Exercise - 10/31/15 0001    Ambulation/Gait   Stairs Yes   Stairs Assistance 6: Modified independent (Device/Increase time)   Stair  Management Technique One rail Right;One rail Left;Alternating pattern;Step to pattern;Forwards;With cane   Number of Stairs 8   Height of Stairs 6   Gait Comments Patient does not like cane in her correct hand.  Patient does not nne cane inside on level.     Knee/Hip Exercises: Aerobic   Nustep 7 minutes L5   Knee/Hip Exercises: Seated   Heel Slides AROM;Right;2 sets;10 reps   Knee/Hip Exercises: Supine   Heel Slides 10 reps  1 set   Cryotherapy   Number Minutes Cryotherapy 10 Minutes   Cryotherapy Location Knee   Type of Cryotherapy --  cold pack   Manual Therapy   Manual therapy comments gentle stretches only tolerated today.  Tape trial,  for quads and ,2 fans for edema.  Knee warm to touch today, tight and shiney .   Passive ROM passive stretch, with distraction                  PT Short Term Goals - 10/31/15 1136    PT SHORT TERM GOAL #1   Title pt will be I with initial HEP (10/25/2015)   Status Achieved   PT SHORT TERM GOAL #2   Title pt will improve her knee extension to </= -13 degrees and flexion to >/= 80 degrees with </= 5/10 pain  to assist with improved knee mobility (10/25/2015)   Time 4   Period Weeks   Status Unable to assess   PT SHORT TERM GOAL #3   Title she will improve her R hip/knee strength to >/= 4-/5 with </= 5/10 pain to assist with functional progression (10/25/2015)   Time 4   Period Weeks   Status Unable to assess   PT SHORT TERM GOAL #4   Title she will be able to walk >/= 15 min with LRAD with </= 5/10 pain with +1 SBA to promote safety and improve functional gait (10/25/2015)   Time 4   Period Weeks   Status On-going           PT Long Term Goals - 09/24/15 1620    PT LONG TERM GOAL #1   Title pt will be I with all HEP as of last visit (11/19/2015)   Time 8   Period Weeks   Status New   PT LONG TERM GOAL #2   Title pt will improve her knee extension to </= -5 degrees and flexion to >/= 120 degrees to assist with functional and  efficient gait pattern (11/19/2015)   Time 8   Period Weeks   Status New   PT LONG TERM GOAL #3   Title pt will improve her R hip/ knee strength to >/= 4/5 with </= 2/10 pain to assist with safety with prolonged walking and standing activities (11/19/2015)   Time 8   Period Weeks   Status New   PT LONG TERM GOAL #4   Title pt willbe able to walk for >/= 20  minutes with no AD with </= 2/10 pain to assist with functional endurance (11/19/2015)   Time 8   Period Weeks   Status New   PT LONG TERM GOAL #5   Title pt will improve her FOTO score to >/= 50 at discharge to demonstrate improvement in function at discharge (11/19/2015)   Time 8   Period Weeks   Status New               Plan - 10/31/15 1133    Clinical Impression Statement Stiff and sore today knee,  pain flare.  May be due to cleaning up after a washing machine leak.  Has back pain now.  Trial of tape.  Knee Range decreased to 90 at the most visually estimated. Patient able to use cane on stairs safely.     PT Next Visit Plan gait training, continue mobs anterior to posterior to improve fleixon, Modalities for pain. Challange stairs with no rail with or without cane. Measure strength and motion..  Check specific goals.    PT Home Exercise Plan continue stretches/ strengthening   Consulted and Agree with Plan of Care Patient      Patient will benefit from skilled therapeutic intervention in order to improve the following deficits and impairments:  Pain, Improper body mechanics, Postural dysfunction, Increased edema, Decreased strength, Decreased mobility, Difficulty walking, Decreased range of motion, Decreased activity tolerance, Hypomobility, Decreased balance  Visit Diagnosis: Localized edema  Stiffness of right knee, not elsewhere classified  Other abnormalities of gait and mobility  Muscle weakness (generalized)  Pain in left knee     Problem List Patient Active Problem List   Diagnosis Date Noted  .  Osteoarthritis of right knee 09/06/2015  . Status post total right knee replacement 09/06/2015  . Cough 09/14/2012    Nyeema Want 10/31/2015, 11:39 AM  [facility] [address] [place], [place], [address] Phone: [phone]   Fax:  [phone]  Name: Lenox AhrBridget M Favata MRN: [id number] Date of Birth: Jun 03, 1961    Liz BeachKaren Tanajah Boulter, PTA 10/31/2015 11:39 AM Phone: [phone] Fax: [phone]

## 2015-11-04 ENCOUNTER — Ambulatory Visit: Payer: BC Managed Care – PPO | Admitting: Physical Therapy

## 2015-11-04 DIAGNOSIS — R600 Localized edema: Secondary | ICD-10-CM

## 2015-11-04 DIAGNOSIS — R2689 Other abnormalities of gait and mobility: Secondary | ICD-10-CM

## 2015-11-04 DIAGNOSIS — M6281 Muscle weakness (generalized): Secondary | ICD-10-CM

## 2015-11-04 DIAGNOSIS — M25562 Pain in left knee: Secondary | ICD-10-CM

## 2015-11-04 DIAGNOSIS — M25661 Stiffness of right knee, not elsewhere classified: Secondary | ICD-10-CM

## 2015-11-04 NOTE — Therapy (Signed)
[facility] Outpatient Rehabilitation [facility] [address] [place], [place], [address] Phone: [phone]   Fax:  [phone]  Physical Therapy Treatment  Patient Details  Name: Chelsea Wheeler MRN: [id number] Date of Birth: 1961/05/16 Referring Provider: Allie Bossier MD  Encounter Date: 11/04/2015      PT End of Session - 11/04/15 1241    Visit Number 14   Number of Visits 16   Date for PT Re-Evaluation 11/19/15   PT Start Time 1147   PT Stop Time 1241   PT Time Calculation (min) 54 min   Activity Tolerance Patient tolerated treatment well   Behavior During Therapy [facility] for tasks assessed/performed      Past Medical History  Diagnosis Date  . Seasonal allergies   . Hypertension   . Arthritis     knee  . Claustrophobia   . GERD (gastroesophageal reflux disease)     Past Surgical History  Procedure Laterality Date  . Tubal ligation  08-31-06  . Endometrial ablation  2007  . Knee arthroscopy Right 03/22/2015    Procedure: ARTHROSCOPY KNEE, partial lateral meniscectomy, microfracture, chondraplasty;  Surgeon: Erin Sons, MD;  Location: [facility] ORS;  Service: Orthopedics;  Laterality: Right;  . Total knee arthroplasty Right 09/06/2015    Procedure: RIGHT TOTAL KNEE ARTHROPLASTY;  Surgeon: Kathryne Hitch, MD;  Location: [facility];  Service: Orthopedics;  Laterality: Right;    There were no vitals filed for this visit.      Subjective Assessment - 11/04/15 1151    Subjective I go to the Doctor today.  Knee sore vs pain. Not sure if the tape helped.  I wore it with the brace to keep it warm.     Pain Orientation Right   Pain Descriptors / Indicators Aching   Pain Frequency Intermittent   Pain Relieving Factors ice, medication            [facility] PT Assessment - 11/04/15 0001    AROM   Right Knee Extension -10   Right Knee Flexion 100   PROM   Right Knee Flexion 105   Strength   Right Hip Extension 5/5   Right Hip ABduction 4+/5   Right Hip ADduction 4/5   Left Hip Flexion 4+/5                     [facility] Adult PT Treatment/Exercise - 11/04/15 1200    Knee/Hip Exercises: Aerobic   Nustep 9 minutes  L5   Knee/Hip Exercises: Machines for Strengthening   Total Gym Leg Press    1 leg, 1 plate, 10 X 2    Knee/Hip Exercises: Standing   Heel Raises Limitations tip toe 30 feet   Step Down 10 reps;2 sets  1 set no hands,    SLS with Vectors rt foot on rocker board with left leg moving forward and then behind 10 X with no hands.     Knee/Hip Exercises: Seated   Stool Scoot - Round Trips 44 feet  X 2   Sit to Sand 10 reps   Knee/Hip Exercises: Supine   Heel Slides 10 reps                  PT Short Term Goals - 11/04/15 1245    PT SHORT TERM GOAL #1   Title pt will be I with initial HEP (10/25/2015)   Baseline independent   Time 4   Period Weeks   Status Achieved   PT  SHORT TERM GOAL #2   Title pt will improve her knee extension to </= -13 degrees and flexion to >/= 80 degrees with </= 5/10 pain to assist with improved knee mobility (10/25/2015)   Baseline -10 to 100   Time 4   Period Weeks   Status Achieved   PT SHORT TERM GOAL #3   Title she will improve her R hip/knee strength to >/= 4-/5 with </= 5/10 pain to assist with functional progression (10/25/2015)   Baseline Hip 4 to 5/5 strength, knee 4+/5, no pain reported.   Time 4   Period Weeks   Status Achieved   PT SHORT TERM GOAL #4   Title she will be able to walk >/= 15 min with LRAD with </= 5/10 pain with +1 SBA to promote safety and improve functional gait (10/25/2015)   Baseline she does not know how long she can walk.   Time 4   Period Weeks   Status On-going           PT Long Term Goals - 09/24/15 1620    PT LONG TERM GOAL #1   Title pt will be I with all HEP as of last visit (11/19/2015)   Time 8   Period Weeks   Status New   PT LONG TERM GOAL #2   Title pt will improve her knee extension to </= -5 degrees and  flexion to >/= 120 degrees to assist with functional and efficient gait pattern (11/19/2015)   Time 8   Period Weeks   Status New   PT LONG TERM GOAL #3   Title pt will improve her R hip/ knee strength to >/= 4/5 with </= 2/10 pain to assist with safety with prolonged walking and standing activities (11/19/2015)   Time 8   Period Weeks   Status New   PT LONG TERM GOAL #4   Title pt willbe able to walk for >/= 20  minutes with no AD with </= 2/10 pain to assist with functional endurance (11/19/2015)   Time 8   Period Weeks   Status New   PT LONG TERM GOAL #5   Title pt will improve her FOTO score to >/= 50 at discharge to demonstrate improvement in function at discharge (11/19/2015)   Time 8   Period Weeks   Status New               Plan - 11/04/15 1242    Clinical Impression Statement Patient attending PT for the first time without the cane.  PROM 105 RT,  AROM -10 to 100 degrees.  She has met STG#2 and #3 today.  She was unable to tell me how long she can walk at a time.  She has weaned from her pain medication because it didn't really help.   PT Next Visit Plan See what MD says.     PT Home Exercise Plan continue stretches/ strengthening.  Time how long you can walk.    Consulted and Agree with Plan of Care Patient      Patient will benefit from skilled therapeutic intervention in order to improve the following deficits and impairments:  Pain, Improper body mechanics, Postural dysfunction, Increased edema, Decreased strength, Decreased mobility, Difficulty walking, Decreased range of motion, Decreased activity tolerance, Hypomobility, Decreased balance  Visit Diagnosis: Localized edema  Stiffness of right knee, not elsewhere classified  Other abnormalities of gait and mobility  Muscle weakness (generalized)  Pain in left knee     Problem  List Patient Active Problem List   Diagnosis Date Noted  . Osteoarthritis of right knee 09/06/2015  . Status post total right  knee replacement 09/06/2015  . Cough 09/14/2012    [name],[name] 11/04/2015, 12:48 PM  [facility] [address] [place], [place], [address] Phone: [phone]   Fax:  [phone]  Name: AUTYM SIESS MRN: [id number] Date of Birth: [birth year]    Melvenia Needles, PTA 11/04/2015 12:48 PM Phone: [phone] Fax: [phone]

## 2015-11-06 ENCOUNTER — Ambulatory Visit: Payer: BC Managed Care – PPO | Admitting: Physical Therapy

## 2015-11-06 DIAGNOSIS — M6281 Muscle weakness (generalized): Secondary | ICD-10-CM

## 2015-11-06 DIAGNOSIS — M25562 Pain in left knee: Secondary | ICD-10-CM

## 2015-11-06 DIAGNOSIS — R2689 Other abnormalities of gait and mobility: Secondary | ICD-10-CM

## 2015-11-06 DIAGNOSIS — M25661 Stiffness of right knee, not elsewhere classified: Secondary | ICD-10-CM

## 2015-11-06 DIAGNOSIS — R600 Localized edema: Secondary | ICD-10-CM

## 2015-11-06 NOTE — Therapy (Signed)
[facility] [address] [place], [place], [address] Phone: [phone]   Fax:  [phone]  Physical Therapy Treatment  Patient Details  Name: Chelsea Wheeler MRN: [id number] Date of Birth: 11-14-1960 Referring Provider: Allie Bossierhris Blackman MD  Encounter Date: 11/06/2015      PT End of Session - 11/06/15 1233    Visit Number 15   Number of Visits 16   Date for PT Re-Evaluation 11/19/15   PT Start Time 1020   PT Stop Time 1115   PT Time Calculation (min) 55 min   Activity Tolerance Patient tolerated treatment well   Behavior During Therapy [facility] for tasks assessed/performed      Past Medical History  Diagnosis Date  . Seasonal allergies   . Hypertension   . Arthritis     knee  . Claustrophobia   . GERD (gastroesophageal reflux disease)     Past Surgical History  Procedure Laterality Date  . Tubal ligation  08-31-06  . Endometrial ablation  2007  . Knee arthroscopy Right 03/22/2015    Procedure: ARTHROSCOPY KNEE, partial lateral meniscectomy, microfracture, chondraplasty;  Surgeon: Erin SonsHarold Kernodle, MD;  Location: [facility] ORS;  Service: Orthopedics;  Laterality: Right;  . Total knee arthroplasty Right 09/06/2015    Procedure: RIGHT TOTAL KNEE ARTHROPLASTY;  Surgeon: Kathryne Hitchhristopher Y Blackman, MD;  Location: [facility];  Service: Orthopedics;  Laterality: Right;    There were no vitals filed for this visit.      Subjective Assessment - 11/06/15 1027    Subjective MD said I need more flexion.  In 1 month may need a manipulation.  New script for PT 4 more weeks. He gave me a cortizone shot Lateral knee.     Currently in Pain? No/denies   Pain Location Knee   Pain Orientation Right   Pain Descriptors / Indicators Sore   Pain Frequency Intermittent   Pain Relieving Factors cortizone shot                         [facility] Adult PT Treatment/Exercise - 11/06/15 1054    Knee/Hip Exercises: Stretches   Quad Stretch 3  reps;30 seconds   Knee: Self-Stretch to increase Flexion --  with towel roll, right   Other Knee/Hip Stretches prone hang leg off table 1 minute X2 during soft tissue work distal hamstrings.    Other Knee/Hip Stretches self stretch with towel roll   Knee/Hip Exercises: Aerobic   Nustep 6 minutes L5 to warm up   Knee/Hip Exercises: Machines for Strengthening   Total Gym Leg Press 10pounds leg press for stretches into flexion, multiple reps.   Knee/Hip Exercises: Seated   Heel Slides AROM   Stool Scoot - Round Trips 30 feet x 1 20 feet x1   Knee/Hip Exercises: Supine   Heel Slides 10 reps   Heel Slides Limitations uses strapy   Patellar Mobs yes   Cryotherapy   Number Minutes Cryotherapy 10 Minutes   Cryotherapy Location Knee   Type of Cryotherapy --  cold pack   Manual Therapy   Manual therapy comments soft tissue work insrtument assist distal medial quads.    Passive ROM passive  for extension and flexion                  PT Short Term Goals - 11/04/15 1245    PT SHORT TERM GOAL #1   Title pt will be I with initial HEP (10/25/2015)   Baseline  independent   Time 4   Period Weeks   Status Achieved   PT SHORT TERM GOAL #2   Title pt will improve her knee extension to </= -13 degrees and flexion to >/= 80 degrees with </= 5/10 pain to assist with improved knee mobility (10/25/2015)   Baseline -10 to 100   Time 4   Period Weeks   Status Achieved   PT SHORT TERM GOAL #3   Title she will improve her R hip/knee strength to >/= 4-/5 with </= 5/10 pain to assist with functional progression (10/25/2015)   Baseline Hip 4 to 5/5 strength, knee 4+/5, no pain reported.   Time 4   Period Weeks   Status Achieved   PT SHORT TERM GOAL #4   Title she will be able to walk >/= 15 min with LRAD with </= 5/10 pain with +1 SBA to promote safety and improve functional gait (10/25/2015)   Baseline she does not know how long she can walk.   Time 4   Period Weeks   Status On-going            PT Long Term Goals - 09/24/15 1620    PT LONG TERM GOAL #1   Title pt will be I with all HEP as of last visit (11/19/2015)   Time 8   Period Weeks   Status New   PT LONG TERM GOAL #2   Title pt will improve her knee extension to </= -5 degrees and flexion to >/= 120 degrees to assist with functional and efficient gait pattern (11/19/2015)   Time 8   Period Weeks   Status New   PT LONG TERM GOAL #3   Title pt will improve her R hip/ knee strength to >/= 4/5 with </= 2/10 pain to assist with safety with prolonged walking and standing activities (11/19/2015)   Time 8   Period Weeks   Status New   PT LONG TERM GOAL #4   Title pt willbe able to walk for >/= 20  minutes with no AD with </= 2/10 pain to assist with functional endurance (11/19/2015)   Time 8   Period Weeks   Status New   PT LONG TERM GOAL #5   Title pt will improve her FOTO score to >/= 50 at discharge to demonstrate improvement in function at discharge (11/19/2015)   Time 8   Period Weeks   Status New               Plan - 11/06/15 1234    Clinical Impression Statement Steroid shot helpful with stretching.  PROM continues to slowly improve.  107+ PROM today. Knee tightened after max stretch.  No pain post session.   PT Next Visit Plan stretch   PT Home Exercise Plan continue   Consulted and Agree with Plan of Care Patient      Patient will benefit from skilled therapeutic intervention in order to improve the following deficits and impairments:  Pain, Improper body mechanics, Postural dysfunction, Increased edema, Decreased strength, Decreased mobility, Difficulty walking, Decreased range of motion, Decreased activity tolerance, Hypomobility, Decreased balance  Visit Diagnosis: Localized edema  Stiffness of right knee, not elsewhere classified  Other abnormalities of gait and mobility  Muscle weakness (generalized)  Pain in left knee     Problem List Patient Active Problem List    Diagnosis Date Noted  . Osteoarthritis of right knee 09/06/2015  . Status post total right knee replacement 09/06/2015  . Cough  09/14/2012    Chelsea Wheeler 11/06/2015, 12:57 PM  [facility] [address] [place], [place], [address] Phone: [phone]   Fax:  [phone]  Name: Chelsea Wheeler MRN: [id number] Date of Birth: July 14, 1960    Liz Beach, PTA 11/06/2015 12:57 PM Phone: [phone] Fax: [phone]

## 2015-11-11 ENCOUNTER — Ambulatory Visit: Payer: BC Managed Care – PPO | Admitting: Physical Therapy

## 2015-11-11 DIAGNOSIS — M25562 Pain in left knee: Secondary | ICD-10-CM

## 2015-11-11 DIAGNOSIS — M25661 Stiffness of right knee, not elsewhere classified: Secondary | ICD-10-CM

## 2015-11-11 DIAGNOSIS — R600 Localized edema: Secondary | ICD-10-CM

## 2015-11-11 DIAGNOSIS — M6281 Muscle weakness (generalized): Secondary | ICD-10-CM

## 2015-11-11 DIAGNOSIS — R2689 Other abnormalities of gait and mobility: Secondary | ICD-10-CM

## 2015-11-11 NOTE — Therapy (Signed)
[facility] [address] [place], [place], [address] Phone: [phone]   Fax:  [phone]  Physical Therapy Treatment  Patient Details  Name: Chelsea Wheeler MRN: [id number] Date of Birth: [birth year] Referring Provider: Allie Bossierhris Blackman MD  Encounter Date: 11/11/2015      PT End of Session - 11/11/15 1430    Visit Number 16   Number of Visits 16   Date for PT Re-Evaluation 11/19/15   PT Start Time 1017   PT Stop Time 1120   PT Time Calculation (min) 63 min   Activity Tolerance Patient tolerated treatment well   Behavior During Therapy [facility] for tasks assessed/performed      Past Medical History  Diagnosis Date  . Seasonal allergies   . Hypertension   . Arthritis     knee  . Claustrophobia   . GERD (gastroesophageal reflux disease)     Past Surgical History  Procedure Laterality Date  . Tubal ligation  08-31-06  . Endometrial ablation  2007  . Knee arthroscopy Right 03/22/2015    Procedure: ARTHROSCOPY KNEE, partial lateral meniscectomy, microfracture, chondraplasty;  Surgeon: Erin SonsHarold Kernodle, MD;  Location: [facility] ORS;  Service: Orthopedics;  Laterality: Right;  . Total knee arthroplasty Right 09/06/2015    Procedure: RIGHT TOTAL KNEE ARTHROPLASTY;  Surgeon: Kathryne Hitchhristopher Y Blackman, MD;  Location: [facility];  Service: Orthopedics;  Laterality: Right;    There were no vitals filed for this visit.      Subjective Assessment - 11/11/15 1023    Subjective Slipped in shower and bruised her ribs 2 weeks ago.  Did not hurt her knee.  Sore vs pain today.     Currently in Pain? No/denies   Pain Location Knee   Pain Orientation Right;Lateral   Pain Descriptors / Indicators Sore   Pain Frequency Constant   Aggravating Factors  various stretches and at rest   Pain Relieving Factors ice, TENS   Effect of Pain on Daily Activities having trouble sleeping.             [facility] Assessment - 11/11/15 0001    PROM   Right  Knee Flexion 105  on recumbant bike                     [facility] Adult PT Treatment/Exercise - 11/11/15 0001    Knee/Hip Exercises: Stretches   Other Knee/Hip Stretches on recumbant end range   Knee/Hip Exercises: Aerobic   Nustep 6 minutes L5 to warm up   Knee/Hip Exercises: Machines for Strengthening   Total Gym Leg Press 10 X 3 plates both   Knee/Hip Exercises: Seated   Stool Scoot - Round Trips 31 feet 4 X  2 laps RT only.   Knee/Hip Exercises: Supine   Heel Slides 10 reps   Heel Slides Limitations strap   Patellar Mobs checked   Cryotherapy   Number Minutes Cryotherapy 10 Minutes   Cryotherapy Location Knee   Type of Cryotherapy --  cols pack   Manual Therapy   Manual therapy comments taping 2 fans for swelling,  1 , Y to activate quads.     Passive ROM PROM, Flexion Right                  PT Short Term Goals - 11/11/15 1431    PT SHORT TERM GOAL #1   Title pt will be I with initial HEP (10/25/2015)   Status Achieved   PT SHORT TERM GOAL #  2   Title pt will improve her knee extension to </= -13 degrees and flexion to >/= 80 degrees with </= 5/10 pain to assist with improved knee mobility (10/25/2015)   Status Achieved   PT SHORT TERM GOAL #3   Title she will improve her R hip/knee strength to >/= 4-/5 with </= 5/10 pain to assist with functional progression (10/25/2015)   Status Achieved   PT SHORT TERM GOAL #4   Title she will be able to walk >/= 15 min with LRAD with </= 5/10 pain with +1 SBA to promote safety and improve functional gait (10/25/2015)   Time 4   Status Unable to assess           PT Long Term Goals - 11/11/15 1432    PT LONG TERM GOAL #1   Title pt will be I with all HEP as of last visit (11/19/2015)   Time 8   Period Weeks   Status On-going   PT LONG TERM GOAL #2   Title pt will improve her knee extension to </= -5 degrees and flexion to >/= 120 degrees to assist with functional and efficient gait pattern (11/19/2015)   Time  8   Period Weeks   Status On-going   PT LONG TERM GOAL #3   Title pt will improve her R hip/ knee strength to >/= 4/5 with </= 2/10 pain to assist with safety with prolonged walking and standing activities (11/19/2015)   Time 8   Period Weeks   Status On-going   PT LONG TERM GOAL #4   Title pt willbe able to walk for >/= 20  minutes with no AD with </= 2/10 pain to assist with functional endurance (11/19/2015)   Time 8   Period Weeks   Status Unable to assess   PT LONG TERM GOAL #5   Title pt will improve her FOTO score to >/= 50 at discharge to demonstrate improvement in function at discharge (11/19/2015)   Time 8   Period Weeks   Status Unable to assess               Plan - 11/11/15 1431    Clinical Impression Statement 105 PROM Right knee,  tape was helpful so it was re applied at her request.     PT Next Visit Plan Stretch  FOTO?  MMT KNee, Measure ROM, ask how long she can walk   PT Home Exercise Plan continue   Consulted and Agree with Plan of Care Patient      Patient will benefit from skilled therapeutic intervention in order to improve the following deficits and impairments:  Pain, Improper body mechanics, Postural dysfunction, Increased edema, Decreased strength, Decreased mobility, Difficulty walking, Decreased range of motion, Decreased activity tolerance, Hypomobility, Decreased balance  Visit Diagnosis: Localized edema  Stiffness of right knee, not elsewhere classified  Other abnormalities of gait and mobility  Muscle weakness (generalized)  Pain in left knee     Problem List Patient Active Problem List   Diagnosis Date Noted  . Osteoarthritis of right knee 09/06/2015  . Status post total right knee replacement 09/06/2015  . Cough 09/14/2012    Chelsea Wheeler 11/11/2015, 2:35 PM  [facility] [address] [place], [place], [address] Phone: [phone]   Fax:  [phone]  Name: Chelsea Wheeler MRN: [id number] Date of Birth: 05/29/1961    Liz Beach, PTA 11/11/2015 2:35 PM Phone: [phone] Fax: [phone]

## 2015-11-13 ENCOUNTER — Ambulatory Visit: Payer: BC Managed Care – PPO | Admitting: Physical Therapy

## 2015-11-13 DIAGNOSIS — R600 Localized edema: Secondary | ICD-10-CM

## 2015-11-13 DIAGNOSIS — M6281 Muscle weakness (generalized): Secondary | ICD-10-CM

## 2015-11-13 DIAGNOSIS — M25562 Pain in left knee: Secondary | ICD-10-CM

## 2015-11-13 DIAGNOSIS — R2689 Other abnormalities of gait and mobility: Secondary | ICD-10-CM

## 2015-11-13 DIAGNOSIS — M25661 Stiffness of right knee, not elsewhere classified: Secondary | ICD-10-CM

## 2015-11-13 NOTE — Therapy (Signed)
[place], [place], [address] Phone: [phone]   Fax:  [phone]  Physical Therapy Treatment  Patient Details  Name: Chelsea Wheeler MRN: [id number] Date of Birth: [birth year] Referring Provider: Zollie Beckers MD  Encounter Date: 11/13/2015      PT End of Session - 11/13/15 1150    Visit Number 17   Number of Visits 16   Date for PT Re-Evaluation 11/19/15   PT Start Time 1829   PT Stop Time 1112   PT Time Calculation (min) 57 min   Activity Tolerance Patient tolerated treatment well   Behavior During Therapy [facility] for tasks assessed/performed      Past Medical History  Diagnosis Date  . Seasonal allergies   . Hypertension   . Arthritis     knee  . Claustrophobia   . GERD (gastroesophageal reflux disease)     Past Surgical History  Procedure Laterality Date  . Tubal ligation  08-31-06  . Endometrial ablation  2007  . Knee arthroscopy Right 03/22/2015    Procedure: ARTHROSCOPY KNEE, partial lateral meniscectomy, microfracture, chondraplasty;  Surgeon: Leanor Kail, MD;  Location: [facility] ORS;  Service: Orthopedics;  Laterality: Right;  . Total knee arthroplasty Right 09/06/2015    Procedure: RIGHT TOTAL KNEE ARTHROPLASTY;  Surgeon: Mcarthur Rossetti, MD;  Location: [facility];  Service: Orthopedics;  Laterality: Right;    There were no vitals filed for this visit.      Subjective Assessment - 11/13/15 1021    Subjective Patient able to stand and walk at least 15 minutes.  Patient is using over the counter Medication.  She is able to drive as long as no narcotics.  Able to walk with a buggy at [place] 30 minutes she used to ride in the cart.     Currently in Pain? Yes   Pain Score 3    Pain Location Knee   Pain Orientation Right;Lateral   Pain Descriptors / Indicators Sore  stiff   Pain Frequency Constant   Aggravating Factors  walking longer,  end range stretching.   Pain Relieving  Factors rest, over the counter meds   Effect of Pain on Daily Activities ice TENS            [facility] PT Assessment - 11/13/15 0001    PROM   Right Knee Flexion 110  PROM with strap                     [facility] Adult PT Treatment/Exercise - 11/13/15 1026    Knee/Hip Exercises: Stretches   Quad Stretch 5 reps  prone with strap,gentle overpressure, manually by PTA   Knee/Hip Exercises: Aerobic   Nustep 8 minutes  for warm ups    Knee/Hip Exercises: Machines for Strengthening   Total Gym Leg Press for stretching, 1 plate lowering to 5 th leve;l  10 reps 3  levels of flexion, 1 plate   Knee/Hip Exercises: Standing   Lateral Step Up 1 set;10 reps  heel touch down, challanging   Forward Step Up 2 sets;10 reps   Step Down 1 set;10 reps;Step Height: 4"   Step Down Limitations fatigued, deviations    SLS 52 seconds X1   SLS with Vectors with and without pod,.  Pod+ 7,6,14 tosses in a row, on floor 20 tosses in a row without lowerinfg Left foot to floor,  wobbley., SBA   Knee/Hip Exercises: Supine   Terminal Knee Extension 10 reps  prone, 5 second holds.    Knee/Hip Exercises: Prone   Hip Extension Limitations terminal knee extension 10 X 5 seconds   Cryotherapy   Number Minutes Cryotherapy 10 Minutes   Cryotherapy Location Knee   Type of Cryotherapy --  cold pack, legs elevated   Manual Therapy   Manual Therapy Soft tissue mobilization   Soft tissue mobilization lateral distal thige, knee, tender,  tissue softened   Passive ROM prom for flexion 5 reps                   PT Short Term Goals - 11/13/15 1153    PT SHORT TERM GOAL #1   Title pt will be I with initial HEP (10/25/2015)   Time 4   Period Weeks   Status Achieved   PT SHORT TERM GOAL #2   Title pt will improve her knee extension to </= -13 degrees and flexion to >/= 80 degrees with </= 5/10 pain to assist with improved knee mobility (10/25/2015)   Time 4   Period Weeks   Status Achieved   PT  SHORT TERM GOAL #3   Title she will improve her R hip/knee strength to >/= 4-/5 with </= 5/10 pain to assist with functional progression (10/25/2015)   Time 4   Period Weeks   Status Achieved   PT SHORT TERM GOAL #4   Title she will be able to walk >/= 15 min with LRAD with </= 5/10 pain with +1 SBA to promote safety and improve functional gait (10/25/2015)   Baseline 15 minutes in garage, in store with cart push 30 minutes,  minimal pain   Time 4   Period Weeks   Status Partially Met           PT Long Term Goals - 11/11/15 1432    PT LONG TERM GOAL #1   Title pt will be I with all HEP as of last visit (11/19/2015)   Time 8   Period Weeks   Status On-going   PT LONG TERM GOAL #2   Title pt will improve her knee extension to </= -5 degrees and flexion to >/= 120 degrees to assist with functional and efficient gait pattern (11/19/2015)   Time 8   Period Weeks   Status On-going   PT LONG TERM GOAL #3   Title pt will improve her R hip/ knee strength to >/= 4/5 with </= 2/10 pain to assist with safety with prolonged walking and standing activities (11/19/2015)   Time 8   Period Weeks   Status On-going   PT LONG TERM GOAL #4   Title pt willbe able to walk for >/= 20  minutes with no AD with </= 2/10 pain to assist with functional endurance (11/19/2015)   Time 8   Period Weeks   Status Unable to assess   PT LONG TERM GOAL #5   Title pt will improve her FOTO score to >/= 50 at discharge to demonstrate improvement in function at discharge (11/19/2015)   Time 8   Period Weeks   Status Unable to assess               Plan - 11/13/15 1151    Clinical Impression Statement pain 2.5/10 post exercise.  ROM gradually improving with flexion,  Continues to be challanged with eccentric quads.    PT Next Visit Plan stretch,  quad strengthening, closed chain   PT Home Exercise Plan continue      Patient  will benefit from skilled therapeutic intervention in order to improve the following  deficits and impairments:  Pain, Improper body mechanics, Postural dysfunction, Increased edema, Decreased strength, Decreased mobility, Difficulty walking, Decreased range of motion, Decreased activity tolerance, Hypomobility, Decreased balance  Visit Diagnosis: Localized edema  Stiffness of right knee, not elsewhere classified  Other abnormalities of gait and mobility  Muscle weakness (generalized)  Pain in left knee     Problem List Patient Active Problem List   Diagnosis Date Noted  . Osteoarthritis of right knee 09/06/2015  . Status post total right knee replacement 09/06/2015  . Cough 09/14/2012    Chelsea Wheeler 11/13/2015, 11:55 AM  [facility] [address] [place], [place], [address] Phone: [phone]   Fax:  [phone]  Name: Chelsea Wheeler MRN: [id number] Date of Birth: June 05, 1961    Melvenia Needles, PTA 11/13/2015 11:55 AM Phone: [phone] Fax: [phone]

## 2015-11-18 ENCOUNTER — Ambulatory Visit: Payer: BC Managed Care – PPO | Admitting: Physical Therapy

## 2015-11-18 DIAGNOSIS — M6281 Muscle weakness (generalized): Secondary | ICD-10-CM

## 2015-11-18 DIAGNOSIS — M25562 Pain in left knee: Secondary | ICD-10-CM

## 2015-11-18 DIAGNOSIS — R600 Localized edema: Secondary | ICD-10-CM | POA: Diagnosis not present

## 2015-11-18 DIAGNOSIS — M25661 Stiffness of right knee, not elsewhere classified: Secondary | ICD-10-CM

## 2015-11-18 DIAGNOSIS — R2689 Other abnormalities of gait and mobility: Secondary | ICD-10-CM

## 2015-11-18 NOTE — Therapy (Signed)
[place], [place], [address] Phone: [phone]   Fax:  [phone]  Physical Therapy Treatment / Re-certification  Patient Details  Name: Chelsea Wheeler MRN: [id number] Date of Birth: 1961/02/25 Referring Provider: Zollie Beckers MD  Encounter Date: 11/18/2015      PT End of Session - 11/18/15 1120    Visit Number 18   Number of Visits 24   Date for PT Re-Evaluation 12/16/15   PT Start Time 1018   PT Stop Time 1110   PT Time Calculation (min) 52 min   Activity Tolerance Patient tolerated treatment well   Behavior During Therapy [facility] for tasks assessed/performed      Past Medical History  Diagnosis Date  . Seasonal allergies   . Hypertension   . Arthritis     knee  . Claustrophobia   . GERD (gastroesophageal reflux disease)     Past Surgical History  Procedure Laterality Date  . Tubal ligation  08-31-06  . Endometrial ablation  2007  . Knee arthroscopy Right 03/22/2015    Procedure: ARTHROSCOPY KNEE, partial lateral meniscectomy, microfracture, chondraplasty;  Surgeon: Chelsea Kail, MD;  Location: [facility] ORS;  Service: Orthopedics;  Laterality: Right;  . Total knee arthroplasty Right 09/06/2015    Procedure: RIGHT TOTAL KNEE ARTHROPLASTY;  Surgeon: Chelsea Rossetti, MD;  Location: [facility];  Service: Orthopedics;  Laterality: Right;    There were no vitals filed for this visit.      Subjective Assessment - 11/18/15 1024    Subjective "therapy is going good, i do get frustrated from time to time but i am getting better"   Currently in Pain? Yes   Pain Location Knee   Pain Orientation Right;Anterior   Pain Descriptors / Indicators --  stiffness and hypersensitiveity   Pain Onset More than a month ago   Pain Frequency Intermittent   Aggravating Factors  walking longer, end range stretching   Pain Relieving Factors rest, over the counter meds, Ice            [facility] PT  Assessment - 11/18/15 1041    Observation/Other Assessments   Focus on Therapeutic Outcomes (FOTO)  41%   AROM   Right Knee Extension -8   Right Knee Flexion 110   Strength   Right Hip ABduction 4/5   Right Hip ADduction 4/5   Right/Left Knee Right   Right Knee Flexion 4/5   Right Knee Extension 4+/5                     [facility] Adult PT Treatment/Exercise - 11/18/15 0001    Knee/Hip Exercises: Aerobic   Nustep L   Knee/Hip Exercises: Standing   Gait Training heel strike and toe off to facilicate efficient gait 1 x 30 ft, 2 laps of metronome walking at 90 BPM (eliminated limping0    Cryotherapy   Number Minutes Cryotherapy 10 Minutes   Cryotherapy Location Knee   Type of Cryotherapy Ice pack   Manual Therapy   Manual therapy comments contract relax stretching with 10 sec hold and 30 sec stretch for hamstrings and quads x 5 ea.    Joint Mobilization Grade 3 A>P tibiofemoral mobs to promote flexion                PT Education - 11/18/15 1118    Education provided Yes   Education Details gait training that walking hitting heels first for gait pattern and to work on  getting the knee straight and toes off to work on bending the knee and promot efficient gait patter, using a metronome at 90 +BMP to faciliate proper gait because its harder to limp to a beat. Discussed progress and plan for next visits.     Person(s) Educated Patient   Methods Explanation;Verbal cues;Handout   Comprehension Verbalized understanding;Returned demonstration          PT Short Term Goals - 11/18/15 1049    PT SHORT TERM GOAL #1   Title pt will be I with initial HEP (10/25/2015)   Baseline independent   Time 4   Period Weeks   Status Achieved   PT SHORT TERM GOAL #2   Title pt will improve her knee extension to </= -13 degrees and flexion to >/= 80 degrees with </= 5/10 pain to assist with improved knee mobility (10/25/2015)   Time 4   Period Weeks   Status Achieved   PT SHORT  TERM GOAL #3   Title she will improve her R hip/knee strength to >/= 4-/5 with </= 5/10 pain to assist with functional progression (10/25/2015)   Baseline Hip 4 to 5/5 strength, knee 4+/5, no pain reported.   Time 4   Period Weeks   Status Achieved   PT SHORT TERM GOAL #4   Title she will be able to walk >/= 15 min with LRAD with </= 5/10 pain with +1 SBA to promote safety and improve functional gait (10/25/2015)   Baseline 45 min with 2/10 pain   Time 4   Period Weeks   Status Achieved           PT Long Term Goals - 11/18/15 1050    PT LONG TERM GOAL #1   Title pt will be I with all HEP as of last visit (12/11/2015)   Time 4   Period Weeks   Status On-going   PT LONG TERM GOAL #2   Title pt will improve her knee extension to </= -5 degrees and flexion to >/= 120 degrees to assist with functional and efficient gait pattern (12/11/2015)   Baseline flexion 110, extension -8   Time 8   Period Weeks   Status On-going   PT LONG TERM GOAL #3   Title pt will improve her R hip/ knee strength to >/= 4/5 with </= 2/10 pain to assist with safety with prolonged walking and standing activities (11/19/2015)   Time 8   Period Weeks   Status Achieved   PT LONG TERM GOAL #4   Title pt willbe able to walk for >/= 20  minutes with no AD with </= 2/10 pain to assist with functional endurance (11/19/2015)   Time 8   Period Weeks   Status Achieved   PT LONG TERM GOAL #5   Title pt will improve her FOTO score to >/= 50 at discharge to demonstrate improvement in function at discharge (11/19/2015)   Time 8   Status Achieved               Plan - 11/18/15 1156    Clinical Impression Statement [name] continues to make great progress with PT improving her Knee flexion to 110, and extension -8. She requires minimal verbal cueing but is able to correct her antalgic gait pattern. practiced walking with metronome at 90 BPM and was able to continuouously walk without an limp. She met all STGs and LEGS  #3,4, 5 and is making progress toward her remaining goals.  Plan to  continue 2 x a week for the next 4 weeks to work torward remaining goals and independent exertcise.    Rehab Potential Good   PT Frequency 2x / week   PT Duration 4 weeks   PT Treatment/Interventions ADLs/Self Care Home Management;Cryotherapy;Electrical Stimulation;Iontophoresis '4mg'$ /ml Dexamethasone;Therapeutic exercise;Moist Heat;Therapeutic activities;Manual techniques;Vasopneumatic Device;Dry needling;Passive range of motion;Ultrasound;Patient/family education;Balance training;Gait training;Stair training   PT Next Visit Plan stretching, continue to work mobility goal, Closed chain strengthening, ICE prn   PT Home Exercise Plan metronome walking (90BPM)   Consulted and Agree with Plan of Care Patient      Patient will benefit from skilled therapeutic intervention in order to improve the following deficits and impairments:  Pain, Improper body mechanics, Postural dysfunction, Increased edema, Decreased strength, Decreased mobility, Difficulty walking, Decreased range of motion, Decreased activity tolerance, Hypomobility, Decreased balance  Visit Diagnosis: Localized edema - Plan: PT plan of care cert/re-cert  Stiffness of right knee, not elsewhere classified - Plan: PT plan of care cert/re-cert  Other abnormalities of gait and mobility - Plan: PT plan of care cert/re-cert  Muscle weakness (generalized) - Plan: PT plan of care cert/re-cert  Pain in left knee - Plan: PT plan of care cert/re-cert     Problem List Patient Active Problem List   Diagnosis Date Noted  . Osteoarthritis of right knee 09/06/2015  . Status post total right knee replacement 09/06/2015  . Cough 09/14/2012   Starr Lake PT, DPT, LAT, ATC  11/18/2015  1:12 PM      [facility] [address] [place], [place], [address] Phone: [phone]   Fax:  [phone]  Name: Chelsea Wheeler MRN: [id number] Date of Birth: [birth year]

## 2015-11-21 ENCOUNTER — Ambulatory Visit: Payer: BC Managed Care – PPO | Admitting: Physical Therapy

## 2015-11-21 DIAGNOSIS — M25661 Stiffness of right knee, not elsewhere classified: Secondary | ICD-10-CM

## 2015-11-21 DIAGNOSIS — R2689 Other abnormalities of gait and mobility: Secondary | ICD-10-CM

## 2015-11-21 DIAGNOSIS — R600 Localized edema: Secondary | ICD-10-CM | POA: Diagnosis not present

## 2015-11-21 DIAGNOSIS — M25562 Pain in left knee: Secondary | ICD-10-CM

## 2015-11-21 DIAGNOSIS — M6281 Muscle weakness (generalized): Secondary | ICD-10-CM

## 2015-11-21 NOTE — Therapy (Signed)
[facility] [address] [place], [place], [address] Phone: [phone]   Fax:  [phone]  Physical Therapy Treatment  Patient Details  Name: Chelsea Wheeler MRN: [id number] Date of Birth: Nov 04, 1960 Referring Provider: Allie Bossier MD  Encounter Date: 11/21/2015      PT End of Session - 11/21/15 1235    Visit Number 19   Number of Visits 24   Date for PT Re-Evaluation --   PT Start Time 1140   PT Stop Time 1240   PT Time Calculation (min) 60 min   Activity Tolerance Patient tolerated treatment well   Behavior During Therapy [facility] for tasks assessed/performed      Past Medical History  Diagnosis Date  . Seasonal allergies   . Hypertension   . Arthritis     knee  . Claustrophobia   . GERD (gastroesophageal reflux disease)     Past Surgical History  Procedure Laterality Date  . Tubal ligation  08-31-06  . Endometrial ablation  2007  . Knee arthroscopy Right 03/22/2015    Procedure: ARTHROSCOPY KNEE, partial lateral meniscectomy, microfracture, chondraplasty;  Surgeon: Erin Sons, MD;  Location: [facility] ORS;  Service: Orthopedics;  Laterality: Right;  . Total knee arthroplasty Right 09/06/2015    Procedure: RIGHT TOTAL KNEE ARTHROPLASTY;  Surgeon: Kathryne Hitch, MD;  Location: [facility];  Service: Orthopedics;  Laterality: Right;    There were no vitals filed for this visit.      Subjective Assessment - 11/21/15 1140    Subjective Lateral knee pain. 4/10.  Forgot about the metronime walking until last night.  She plans to try walking when it stops raining.    Currently in Pain? Yes   Pain Score 4    Pain Location Knee   Pain Orientation Right;Lateral   Pain Descriptors / Indicators --  tender   Aggravating Factors  not real sure, tender to touch   Pain Relieving Factors rest, over the counter medications,  end range stretching.                          [facility] Adult PT  Treatment/Exercise - 11/21/15 0001    Knee/Hip Exercises: Stretches   Gastroc Stretch Both;3 reps;30 seconds  incline board.   Knee/Hip Exercises: Aerobic   Nustep L5, 6  minutes   Knee/Hip Exercises: Standing   Heel Raises 20 reps  needs hands   Lateral Step Up Right;1 set;10 reps;Step Height: 6"   Forward Step Up Right;1 set;Step Height: 8"   Step Down 2 sets;10 reps   Cryotherapy   Number Minutes Cryotherapy 10 Minutes   Cryotherapy Location Knee   Type of Cryotherapy --  cold pack   Manual Therapy   Manual Therapy Soft tissue mobilization  and taping closed fan    Manual therapy comments  Contract relax 5 X  10 sceonds straightening /10 rexax/30stertch and gold.    117 PROM after                  PT Short Term Goals - 11/18/15 1049    PT SHORT TERM GOAL #1   Title pt will be I with initial HEP (10/25/2015)   Baseline independent   Time 4   Period Weeks   Status Achieved   PT SHORT TERM GOAL #2   Title pt will improve her knee extension to </= -13 degrees and flexion to >/= 80 degrees with </= 5/10 pain to  assist with improved knee mobility (10/25/2015)   Time 4   Period Weeks   Status Achieved   PT SHORT TERM GOAL #3   Title she will improve her R hip/knee strength to >/= 4-/5 with </= 5/10 pain to assist with functional progression (10/25/2015)   Baseline Hip 4 to 5/5 strength, knee 4+/5, no pain reported.   Time 4   Period Weeks   Status Achieved   PT SHORT TERM GOAL #4   Title she will be able to walk >/= 15 min with LRAD with </= 5/10 pain with +1 SBA to promote safety and improve functional gait (10/25/2015)   Baseline 45 min with 2/10 pain   Time 4   Period Weeks   Status Achieved           PT Long Term Goals - 11/18/15 1050    PT LONG TERM GOAL #1   Title pt will be I with all HEP as of last visit (12/11/2015)   Time 4   Period Weeks   Status On-going   PT LONG TERM GOAL #2   Title pt will improve her knee extension to </= -5 degrees and  flexion to >/= 120 degrees to assist with functional and efficient gait pattern (12/11/2015)   Baseline flexion 110, extension -8   Time 8   Period Weeks   Status On-going   PT LONG TERM GOAL #3   Title pt will improve her R hip/ knee strength to >/= 4/5 with </= 2/10 pain to assist with safety with prolonged walking and standing activities (11/19/2015)   Time 8   Period Weeks   Status Achieved   PT LONG TERM GOAL #4   Title pt willbe able to walk for >/= 20  minutes with no AD with </= 2/10 pain to assist with functional endurance (11/19/2015)   Time 8   Period Weeks   Status Achieved   PT LONG TERM GOAL #5   Title pt will improve her FOTO score to >/= 50 at discharge to demonstrate improvement in function at discharge (11/19/2015)   Time 8   Status Achieved               Plan - 11/21/15 1236    Clinical Impression Statement 2/10 pain prior to cold pack , right knee.  Strengthening and stretches continued,  117 PROM post session.   PT Next Visit Plan stretching, continue to work mobility goal, Closed chain strengthening, ICE prn   PT Home Exercise Plan continue   Consulted and Agree with Plan of Care Patient      Patient will benefit from skilled therapeutic intervention in order to improve the following deficits and impairments:  Pain, Improper body mechanics, Postural dysfunction, Increased edema, Decreased strength, Decreased mobility, Difficulty walking, Decreased range of motion, Decreased activity tolerance, Hypomobility, Decreased balance  Visit Diagnosis: Localized edema  Stiffness of right knee, not elsewhere classified  Other abnormalities of gait and mobility  Muscle weakness (generalized)  Pain in left knee     Problem List Patient Active Problem List   Diagnosis Date Noted  . Osteoarthritis of right knee 09/06/2015  . Status post total right knee replacement 09/06/2015  . Cough 09/14/2012    [name],[name] 11/21/2015, 12:39 PM  [facility] [address] [place], [place], [address] Phone: [phone]   Fax:  [phone]  Name: Lenox AhrBridget M Cobin MRN: [id number] Date of Birth: [birth year]    Liz BeachKaren [name], PTA 11/21/2015  12:39 PM Phone: [phone] Fax: [phone]

## 2015-11-26 ENCOUNTER — Encounter: Payer: BC Managed Care – PPO | Admitting: Physical Therapy

## 2015-11-27 ENCOUNTER — Ambulatory Visit: Payer: BC Managed Care – PPO | Admitting: Physical Therapy

## 2015-11-27 DIAGNOSIS — R600 Localized edema: Secondary | ICD-10-CM | POA: Diagnosis not present

## 2015-11-27 DIAGNOSIS — M25562 Pain in left knee: Secondary | ICD-10-CM

## 2015-11-27 DIAGNOSIS — M25661 Stiffness of right knee, not elsewhere classified: Secondary | ICD-10-CM

## 2015-11-27 DIAGNOSIS — R2689 Other abnormalities of gait and mobility: Secondary | ICD-10-CM

## 2015-11-27 DIAGNOSIS — M6281 Muscle weakness (generalized): Secondary | ICD-10-CM

## 2015-11-27 NOTE — Therapy (Signed)
[facility] [address] [place], [place], [address] Phone: [phone]   Fax:  [phone]  Physical Therapy Treatment  Patient Details  Name: Chelsea Wheeler MRN: [id number] Date of Birth: [birth year] Referring Provider: Allie Bossier MD  Encounter Date: 11/27/2015      PT End of Session - 11/27/15 1229    Visit Number 20   Number of Visits 24   Date for PT Re-Evaluation 12/16/15   PT Start Time 0932   PT Stop Time 1025   PT Time Calculation (min) 53 min   Activity Tolerance Patient tolerated treatment well   Behavior During Therapy [facility] for tasks assessed/performed      Past Medical History  Diagnosis Date  . Seasonal allergies   . Hypertension   . Arthritis     knee  . Claustrophobia   . GERD (gastroesophageal reflux disease)     Past Surgical History  Procedure Laterality Date  . Tubal ligation  08-31-06  . Endometrial ablation  2007  . Knee arthroscopy Right 03/22/2015    Procedure: ARTHROSCOPY KNEE, partial lateral meniscectomy, microfracture, chondraplasty;  Surgeon: Chelsea Sons, MD;  Location: [facility] ORS;  Service: Orthopedics;  Laterality: Right;  . Total knee arthroplasty Right 09/06/2015    Procedure: RIGHT TOTAL KNEE ARTHROPLASTY;  Surgeon: Chelsea Hitch, MD;  Location: [facility];  Service: Orthopedics;  Laterality: Right;    There were no vitals filed for this visit.      Subjective Assessment - 11/27/15 0936    Subjective Able to walk 45 minutes and got a little sore.  It is easing today.  I don't feel good, my stomach,   I don't have a fever.    Currently in Pain? Yes   Pain Location Knee   Pain Orientation Right;Lateral   Pain Descriptors / Indicators Sore   Aggravating Factors  longer waking   Pain Relieving Factors rest,  massage,  ICE                         [facility] Adult PT Treatment/Exercise - 11/27/15 0938    Knee/Hip Exercises: Aerobic   Nustep L5 6 minutes   Knee/Hip Exercises: Machines for Strengthening   Total Gym Leg Press 1, plate 10 x, 1.5 10 x 2 sets RT only.  1 plate both legs 10 x for warm up   Knee/Hip Exercises: Standing   Terminal Knee Extension Limitations with ball and with blue band 10 X each of 3 positions.   Lateral Step Up Right;2 sets;10 reps;Hand Hold: 0;Step Height: 6"   Forward Step Up Right;2 sets;10 reps;Step Height: 8";Other (comment)   Forward Step Up Limitations cues for hip   Step Down --  cus to use hip more   Other Standing Knee Exercises --   Cryotherapy   Number Minutes Cryotherapy 10 Minutes   Cryotherapy Location Knee   Type of Cryotherapy --  cold pack, leg elevated   Manual Therapy   Manual therapy comments Taping lateral knee, 50 % middle third  to create space, lateral knee   Passive ROM Contract relax, passive stretch 120 degrees,    post stretch, AAROM                  PT Short Term Goals - 11/18/15 1049    PT SHORT TERM GOAL #1   Title pt will be I with initial HEP (10/25/2015)   Baseline independent   Time 4  Period Weeks   Status Achieved   PT SHORT TERM GOAL #2   Title pt will improve her knee extension to </= -13 degrees and flexion to >/= 80 degrees with </= 5/10 pain to assist with improved knee mobility (10/25/2015)   Time 4   Period Weeks   Status Achieved   PT SHORT TERM GOAL #3   Title she will improve her R hip/knee strength to >/= 4-/5 with </= 5/10 pain to assist with functional progression (10/25/2015)   Baseline Hip 4 to 5/5 strength, knee 4+/5, no pain reported.   Time 4   Period Weeks   Status Achieved   PT SHORT TERM GOAL #4   Title she will be able to walk >/= 15 min with LRAD with </= 5/10 pain with +1 SBA to promote safety and improve functional gait (10/25/2015)   Baseline 45 min with 2/10 pain   Time 4   Period Weeks   Status Achieved           PT Long Term Goals - 11/27/15 1233    PT LONG TERM GOAL #1   Title pt will be I with all HEP as of last  visit (12/11/2015)   Baseline independent with exercises so far   Time 4   Period Weeks   Status On-going   PT LONG TERM GOAL #2   Title pt will improve her knee extension to </= -5 degrees and flexion to >/= 120 degrees to assist with functional and efficient gait pattern (12/11/2015)   Baseline PROM :120   Time 8   Period Weeks   Status On-going   PT LONG TERM GOAL #3   Title pt will improve her R hip/ knee strength to >/= 4/5 with </= 2/10 pain to assist with safety with prolonged walking and standing activities (11/19/2015)   Time 8   Period Weeks   Status Achieved   PT LONG TERM GOAL #4   Title pt willbe able to walk for >/= 20  minutes with no AD with </= 2/10 pain to assist with functional endurance (11/19/2015)   Time 8   Period Weeks   Status Achieved   PT LONG TERM GOAL #5   Title pt will improve her FOTO score to >/= 50 at discharge to demonstrate improvement in function at discharge (11/19/2015)   Time 8   Period Weeks   Status Achieved               Plan - 11/27/15 1230    Clinical Impression Statement Mild soreness vs pain post session prior to cold.  ROM continues to inprove, 120 degrees flexion PROM today.     PT Next Visit Plan stretching, continue to work mobility goal, Closed chain strengthening, ICE prn   PT Home Exercise Plan continue   Consulted and Agree with Plan of Care Patient      Patient will benefit from skilled therapeutic intervention in order to improve the following deficits and impairments:  Pain, Improper body mechanics, Postural dysfunction, Increased edema, Decreased strength, Decreased mobility, Difficulty walking, Decreased range of motion, Decreased activity tolerance, Hypomobility, Decreased balance  Visit Diagnosis: Localized edema  Stiffness of right knee, not elsewhere classified  Other abnormalities of gait and mobility  Muscle weakness (generalized)  Pain in left knee     Problem List Patient Active Problem List    Diagnosis Date Noted  . Osteoarthritis of right knee 09/06/2015  . Status post total right knee replacement 09/06/2015  .  Cough 09/14/2012    Chelsea Wheeler 11/27/2015, 12:35 PM  [facility] [address] [place], [place], [address] Phone: [phone]   Fax:  [phone]  Name: Chelsea Wheeler MRN: [id number] Date of Birth: 1960/11/05    Liz BeachKaren [name], PTA 11/27/2015 12:35 PM Phone: [phone] Fax: [phone]

## 2015-11-28 ENCOUNTER — Ambulatory Visit: Payer: BC Managed Care – PPO | Attending: Orthopaedic Surgery | Admitting: Physical Therapy

## 2015-11-28 DIAGNOSIS — M25661 Stiffness of right knee, not elsewhere classified: Secondary | ICD-10-CM | POA: Insufficient documentation

## 2015-11-28 DIAGNOSIS — R600 Localized edema: Secondary | ICD-10-CM | POA: Insufficient documentation

## 2015-11-28 DIAGNOSIS — M6281 Muscle weakness (generalized): Secondary | ICD-10-CM | POA: Diagnosis present

## 2015-11-28 DIAGNOSIS — R2689 Other abnormalities of gait and mobility: Secondary | ICD-10-CM | POA: Diagnosis present

## 2015-11-28 DIAGNOSIS — M25562 Pain in left knee: Secondary | ICD-10-CM | POA: Diagnosis present

## 2015-11-28 NOTE — Therapy (Signed)
[facility] [address] [place], [place], [address] Phone: [phone]   Fax:  [phone]  Physical Therapy Treatment  Patient Details  Name: Chelsea AhrBridget M Wheeler MRN: [id number] Date of Birth: 1960-10-07 Referring Provider: Allie Bossierhris Blackman MD  Encounter Date: 11/28/2015      PT End of Session - 11/28/15 1041    Visit Number 21   Number of Visits 24   Date for PT Re-Evaluation 12/16/15   PT Start Time 0850   PT Stop Time 0950   PT Time Calculation (min) 60 min   Activity Tolerance Patient tolerated treatment well   Behavior During Therapy [facility] for tasks assessed/performed      Past Medical History  Diagnosis Date  . Seasonal allergies   . Hypertension   . Arthritis     knee  . Claustrophobia   . GERD (gastroesophageal reflux disease)     Past Surgical History  Procedure Laterality Date  . Tubal ligation  08-31-06  . Endometrial ablation  2007  . Knee arthroscopy Right 03/22/2015    Procedure: ARTHROSCOPY KNEE, partial lateral meniscectomy, microfracture, chondraplasty;  Surgeon: Erin SonsHarold Kernodle, MD;  Location: [facility];  Service: Orthopedics;  Laterality: Right;  . Total knee arthroplasty Right 09/06/2015    Procedure: RIGHT TOTAL KNEE ARTHROPLASTY;  Surgeon: Kathryne Hitchhristopher Y Blackman, MD;  Location: [facility];  Service: Orthopedics;  Laterality: Right;    There were no vitals filed for this visit.      Subjective Assessment - 11/28/15 0855    Subjective Sore,  Ice helpful.  Blue band helps a lot.     Currently in Pain? Yes   Pain Score 2   1.5/10   Pain Location Knee                         [facility] Adult PT Treatment/Exercise - 11/28/15 0001    Knee/Hip Exercises: Aerobic   Nustep L5 6 minutes   Knee/Hip Exercises: Machines for Strengthening   Cybex Leg Press 2,3 plates both and single 10 X each  2 plates calf press 10 X,  sharp pain medial knee so stopped   Knee/Hip Exercises: Standing   Forward  Step Up Limitations bosu ball 10 X 1 set.     Functional Squat Limitations on bosu ball 5 X  better with repitition.    Wall Squat Limitations hip hinge, 5 X  and 5 times with kettleball on 4 inch platform with cues.     SLS with Vectors on bosu Ball, difficult, miltiple reps   Walking with Sports Cord Cable cross 10 X forward/reverse, 20 pounds,  5 X side steps , 13 pounds rt/LT leading , uncomfortable.    Cryotherapy   Number Minutes Cryotherapy 10 Minutes   Cryotherapy Location Knee   Type of Cryotherapy --  cold pack                  PT Short Term Goals - 11/28/15 1044    PT SHORT TERM GOAL #1   Title pt will be I with initial HEP (10/25/2015)   Status Achieved   PT SHORT TERM GOAL #2   Title pt will improve her knee extension to </= -13 degrees and flexion to >/= 80 degrees with </= 5/10 pain to assist with improved knee mobility (10/25/2015)   Status Achieved   PT SHORT TERM GOAL #3   Title she will improve her R hip/knee strength to >/= 4-/5 with </=  5/10 pain to assist with functional progression (10/25/2015)   Status Achieved   PT SHORT TERM GOAL #4   Title she will be able to walk >/= 15 min with LRAD with </= 5/10 pain with +1 SBA to promote safety and improve functional gait (10/25/2015)   Status Achieved           PT Long Term Goals - 11/27/15 1233    PT LONG TERM GOAL #1   Title pt will be I with all HEP as of last visit (12/11/2015)   Baseline independent with exercises so far   Time 4   Period Weeks   Status On-going   PT LONG TERM GOAL #2   Title pt will improve her knee extension to </= -5 degrees and flexion to >/= 120 degrees to assist with functional and efficient gait pattern (12/11/2015)   Baseline PROM :120   Time 8   Period Weeks   Status On-going   PT LONG TERM GOAL #3   Title pt will improve her R hip/ knee strength to >/= 4/5 with </= 2/10 pain to assist with safety with prolonged walking and standing activities (11/19/2015)   Time 8    Period Weeks   Status Achieved   PT LONG TERM GOAL #4   Title pt willbe able to walk for >/= 20  minutes with no AD with </= 2/10 pain to assist with functional endurance (11/19/2015)   Time 8   Period Weeks   Status Achieved   PT LONG TERM GOAL #5   Title pt will improve her FOTO score to >/= 50 at discharge to demonstrate improvement in function at discharge (11/19/2015)   Time 8   Period Weeks   Status Achieved               Plan - 11/28/15 1042    Clinical Impression Statement Soreness continues however patient's function improving with less use of cane in clinic.  Patient continues to need worh with strengthening/  ROM/      Patient will benefit from skilled therapeutic intervention in order to improve the following deficits and impairments:     Visit Diagnosis: Localized edema  Stiffness of right knee, not elsewhere classified  Other abnormalities of gait and mobility  Muscle weakness (generalized)     Problem List Patient Active Problem List   Diagnosis Date Noted  . Osteoarthritis of right knee 09/06/2015  . Status post total right knee replacement 09/06/2015  . Cough 09/14/2012    Johnrobert Foti 11/28/2015, 10:46 AM  [facility] [address] [place], [place], [address] Phone: [phone]   Fax:  [phone]  Name: Chelsea Wheeler MRN: [id number] Date of Birth: [birth year]    Liz Beach, PTA 11/28/2015 10:46 AM Phone: [phone] Fax: [phone]

## 2015-12-02 ENCOUNTER — Ambulatory Visit: Payer: BC Managed Care – PPO | Admitting: Physical Therapy

## 2015-12-02 DIAGNOSIS — M25661 Stiffness of right knee, not elsewhere classified: Secondary | ICD-10-CM

## 2015-12-02 DIAGNOSIS — M25562 Pain in left knee: Secondary | ICD-10-CM

## 2015-12-02 DIAGNOSIS — R2689 Other abnormalities of gait and mobility: Secondary | ICD-10-CM

## 2015-12-02 DIAGNOSIS — R600 Localized edema: Secondary | ICD-10-CM | POA: Diagnosis not present

## 2015-12-02 DIAGNOSIS — M6281 Muscle weakness (generalized): Secondary | ICD-10-CM

## 2015-12-02 NOTE — Therapy (Signed)
[facility] [address] [place], [place], [address] Phone: [phone]   Fax:  [phone]  Physical Therapy Treatment  Patient Details  Name: MARIEKE LUBKE MRN: [id number] Date of Birth: 11/23/1960 Referring Provider: Allie Bossier MD  Encounter Date: 12/02/2015      PT End of Session - 12/02/15 1247    Activity Tolerance Patient tolerated treatment well   Behavior During Therapy [facility] for tasks assessed/performed      Past Medical History  Diagnosis Date  . Seasonal allergies   . Hypertension   . Arthritis     knee  . Claustrophobia   . GERD (gastroesophageal reflux disease)     Past Surgical History  Procedure Laterality Date  . Tubal ligation  08-31-06  . Endometrial ablation  2007  . Knee arthroscopy Right 03/22/2015    Procedure: ARTHROSCOPY KNEE, partial lateral meniscectomy, microfracture, chondraplasty;  Surgeon: Erin Sons, MD;  Location: [facility] ORS;  Service: Orthopedics;  Laterality: Right;  . Total knee arthroplasty Right 09/06/2015    Procedure: RIGHT TOTAL KNEE ARTHROPLASTY;  Surgeon: Kathryne Hitch, MD;  Location: [facility];  Service: Orthopedics;  Laterality: Right;    There were no vitals filed for this visit.      Subjective Assessment - 12/02/15 1108    Subjective 1/10 pain. Is not using the cane at all for the last 2-3 weeks.  Able to walk 3 sets of 10 walks up and down her driveway.      Currently in Pain? Yes   Pain Score 1    Pain Location Knee   Pain Orientation Right;Lateral   Pain Descriptors / Indicators Sore   Pain Frequency Intermittent   Aggravating Factors  Hitting knee,    Pain Relieving Factors rest, massage,  ice, TENS            [facility] PT Assessment - 12/02/15 0001    AROM   Right Knee Extension -8   Right Knee Flexion 115   PROM   Right Knee Extension -5   Right Knee Flexion 120   Strength   Right Knee Flexion 4+/5   Right Knee Extension 4+/5                      [facility] Adult PT Treatment/Exercise - 12/02/15 0001    Ambulation/Gait   Stairs Yes   Gait Comments cues for technique,  able to ascend and descend steps step over step 16 steps with minimal use of hands.    Knee/Hip Exercises: Stretches   Passive Hamstring Stretch 2 reps   Passive Hamstring Stretch Limitations 30 seconds   Quad Stretch 3 reps;30 seconds   Knee/Hip Exercises: Aerobic   Nustep L5 10 X   Knee/Hip Exercises: Standing   Forward Step Up Limitations 8 inches 10 X   Wall Squat 10 reps;5 seconds   Walking with Sports Cord 17 pounds pull forward and reverse. 10+ reps each   Knee/Hip Exercises: Supine   Quad Sets 5 reps   Heel Slides 5 reps   Cryotherapy   Number Minutes Cryotherapy 10 Minutes  legs eleveted.   Cryotherapy Location Knee   Type of Cryotherapy --  cold pack   Manual Therapy   Manual therapy comments stretching, gentle for both flexion and extension.  stretches with patellar glides,superior and inferior.    Passive ROM 3 reps of contract relax for knee flexion. prone  PT Short Term Goals - 11/28/15 1044    PT SHORT TERM GOAL #1   Title pt will be I with initial HEP (10/25/2015)   Status Achieved   PT SHORT TERM GOAL #2   Title pt will improve her knee extension to </= -13 degrees and flexion to >/= 80 degrees with </= 5/10 pain to assist with improved knee mobility (10/25/2015)   Status Achieved   PT SHORT TERM GOAL #3   Title she will improve her R hip/knee strength to >/= 4-/5 with </= 5/10 pain to assist with functional progression (10/25/2015)   Status Achieved   PT SHORT TERM GOAL #4   Title she will be able to walk >/= 15 min with LRAD with </= 5/10 pain with +1 SBA to promote safety and improve functional gait (10/25/2015)   Status Achieved           PT Long Term Goals - 12/02/15 1248    PT LONG TERM GOAL #1   Title pt will be I with all HEP as of last visit (12/11/2015)   Baseline  independent with exercises so far   Time 4   Period Weeks   Status On-going   PT LONG TERM GOAL #2   Title pt will improve her knee extension to </= -5 degrees and flexion to >/= 120 degrees to assist with functional and efficient gait pattern (12/11/2015)   Baseline PROM-5 to 120,  AROM after exercise:-8 to 115   Time 8   Period Weeks   Status On-going   PT LONG TERM GOAL #3   Title pt will improve her R hip/ knee strength to >/= 4/5 with </= 2/10 pain to assist with safety with prolonged walking and standing activities (11/19/2015)   Time 8   Period Weeks   Status Achieved   PT LONG TERM GOAL #4   Title pt willbe able to walk for >/= 20  minutes with no AD with </= 2/10 pain to assist with functional endurance (11/19/2015)   Time 8   Period Weeks   Status Achieved   PT LONG TERM GOAL #5   Title pt will improve her FOTO score to >/= 50 at discharge to demonstrate improvement in function at discharge (11/19/2015)   Time 8   Period Weeks   Status Achieved               Plan - 12/02/15 1247    Clinical Impression Statement 0 pain at end of session,  ROM/function improving   PT Next Visit Plan stretching, continue to work mobility goal, Closed chain strengthening, ICE prn,  See what MD says.   Consulted and Agree with Plan of Care Patient      Patient will benefit from skilled therapeutic intervention in order to improve the following deficits and impairments:  Pain, Improper body mechanics, Postural dysfunction, Increased edema, Decreased strength, Decreased mobility, Difficulty walking, Decreased range of motion, Decreased activity tolerance, Hypomobility, Decreased balance  Visit Diagnosis: Localized edema  Stiffness of right knee, not elsewhere classified  Other abnormalities of gait and mobility  Muscle weakness (generalized)  Pain in left knee     Problem List Patient Active Problem List   Diagnosis Date Noted  . Osteoarthritis of right knee 09/06/2015  .  Status post total right knee replacement 09/06/2015  . Cough 09/14/2012    Emmaleah Meroney 12/02/2015, 12:50 PM  [facility] [address] [place], [place], [address] Phone: [phone]  Fax:  [phone]  Name: DEXTER SAUSER MRN: [id number] Date of Birth: 04/05/1961    Liz Beach, PTA 12/02/2015 12:50 PM Phone: [phone] Fax: [phone]

## 2015-12-04 ENCOUNTER — Ambulatory Visit: Payer: BC Managed Care – PPO | Admitting: Physical Therapy

## 2015-12-04 DIAGNOSIS — R2689 Other abnormalities of gait and mobility: Secondary | ICD-10-CM

## 2015-12-04 DIAGNOSIS — R600 Localized edema: Secondary | ICD-10-CM | POA: Diagnosis not present

## 2015-12-04 DIAGNOSIS — M25562 Pain in left knee: Secondary | ICD-10-CM

## 2015-12-04 DIAGNOSIS — M6281 Muscle weakness (generalized): Secondary | ICD-10-CM

## 2015-12-04 DIAGNOSIS — M25661 Stiffness of right knee, not elsewhere classified: Secondary | ICD-10-CM

## 2015-12-04 NOTE — Therapy (Signed)
[facility] [address] [place], [place], [address] Phone: [phone]   Fax:  [phone]  Physical Therapy Treatment  Patient Details  Name: Chelsea Wheeler MRN: [id number] Date of Birth: [birth year] Referring Provider: Allie Bossierhris Blackman MD  Encounter Date: 12/04/2015      PT End of Session - 12/04/15 1256    Visit Number 23   Number of Visits 24   Date for PT Re-Evaluation 12/16/15   Authorization Type BC[employer]   PT Start Time 1015   PT Stop Time 1105   PT Time Calculation (min) 50 min   Activity Tolerance Patient tolerated treatment well   Behavior [facility] Therapy [facility] for tasks assessed/performed      Past Medical History  Diagnosis Date  . Seasonal allergies   . Hypertension   . Arthritis     knee  . Claustrophobia   . GERD (gastroesophageal reflux disease)     Past Surgical History  Procedure Laterality Date  . Tubal ligation  08-31-06  . Endometrial ablation  2007  . Knee arthroscopy Right 03/22/2015    Procedure: ARTHROSCOPY KNEE, partial lateral meniscectomy, microfracture, chondraplasty;  Surgeon: Erin SonsHarold Kernodle, MD;  Location: [facility];  Service: Orthopedics;  Laterality: Right;  . Total knee arthroplasty Right 09/06/2015    Procedure: RIGHT TOTAL KNEE ARTHROPLASTY;  Surgeon: Kathryne Hitchhristopher Y Blackman, MD;  Location: [facility];  Service: Orthopedics;  Laterality: Right;    There were no vitals filed for this visit.      Subjective Assessment - 12/04/15 1017    Subjective "saw the MD on Monday, and he gave me a new brace and wants me to finish out my appointments with PT"    Currently in Pain? Yes   Pain Location Knee   Pain Orientation Right;Lateral            [facility] PT Assessment - 12/04/15 0001    AROM   Right Knee Extension -5                     [facility] Adult PT Treatment/Exercise - 12/04/15 0001    Knee/Hip Exercises: Stretches   Passive Hamstring Stretch 3 reps;30 seconds  with heel  on edge of step   Quad Stretch Right;3 reps;30 seconds  with foot on step leaning into flexion   Knee/Hip Exercises: Aerobic   Nustep L 5 x 10 min  LE only, pushing down into extension every rep   Knee/Hip Exercises: Standing   Step Down Right;1 set;10 reps  with 3 sec eccentric lowering   Gait Training 8 x walking up/ down 4 inch steps, 8 x up/down 6 inch steps with reciprocal motion   Other Standing Knee Exercises 10 x wa   Cryotherapy   Number Minutes Cryotherapy 10 Minutes   Cryotherapy Location Knee   Type of Cryotherapy Ice pack  with 4# weight pushing down   Manual Therapy   Manual therapy comments contract/ relax of hamstring stretcing with 30 sec hold and 10 sec contraction   Soft tissue mobilization IASTM over distal biceps femoris  pt reported relief of tendners                PT Education - 12/04/15 1254    Education provided Yes   Education Details eccentric strengthening that can be applied to all exercises to progress strengthening, using steps at home to work on stretching.    Person(s) Educated Patient   Methods Explanation;Demonstration   Comprehension Verbalized understanding  PT Short Term Goals - 11/28/15 1044    PT SHORT TERM GOAL #1   Title pt will be I with initial HEP (10/25/2015)   Status Achieved   PT SHORT TERM GOAL #2   Title pt will improve her knee extension to </= -13 degrees and flexion to >/= 80 degrees with </= 5/10 pain to assist with improved knee mobility (10/25/2015)   Status Achieved   PT SHORT TERM GOAL #3   Title she will improve her R hip/knee strength to >/= 4-/5 with </= 5/10 pain to assist with functional progression (10/25/2015)   Status Achieved   PT SHORT TERM GOAL #4   Title she will be able to walk >/= 15 min with LRAD with </= 5/10 pain with +1 SBA to promote safety and improve functional gait (10/25/2015)   Status Achieved           PT Long Term Goals - 12/02/15 1248    PT LONG TERM GOAL #1   Title  pt will be I with all HEP as of last visit (12/11/2015)   Baseline independent with exercises so far   Time 4   Period Weeks   Status On-going   PT LONG TERM GOAL #2   Title pt will improve her knee extension to </= -5 degrees and flexion to >/= 120 degrees to assist with functional and efficient gait pattern (12/11/2015)   Baseline PROM-5 to 120,  AROM after exercise:-8 to 115   Time 8   Period Weeks   Status On-going   PT LONG TERM GOAL #3   Title pt will improve her R hip/ knee strength to >/= 4/5 with </= 2/10 pain to assist with safety with prolonged walking and standing activities (11/19/2015)   Time 8   Period Weeks   Status Achieved   PT LONG TERM GOAL #4   Title pt willbe able to walk for >/= 20  minutes with no AD with </= 2/10 pain to assist with functional endurance (11/19/2015)   Time 8   Period Weeks   Status Achieved   PT LONG TERM GOAL #5   Title pt will improve her FOTO score to >/= 50 at discharge to demonstrate improvement in function at discharge (11/19/2015)   Time 8   Period Weeks   Status Achieved               Plan - 12/04/15 1256    Clinical Impression Statement Chelsea Wheeler reported good news from the MD saying he wants to see her back in 6 months, but wants her to finish out the rest of her scheduled visits with PT. Worked on functional mobility with ascending/ descending stairs and  stretching quads/hamstrings using step so she could do it from bus step. following  IASTM she reported some relief of her hamstrings an dimproved her knee extenstion -5.     PT Next Visit Plan stretching, continue to work mobility goal, Closed chain strengthening, ICE prn,final HEP, functional activities   Consulted and Agree with Plan of Care Patient      Patient will benefit from skilled therapeutic intervention in order to improve the following deficits and impairments:  Pain, Improper body mechanics, Postural dysfunction, Increased edema, Decreased strength, Decreased  mobility, Difficulty walking, Decreased range of motion, Decreased activity tolerance, Hypomobility, Decreased balance  Visit Diagnosis: Localized edema  Stiffness of right knee, not elsewhere classified  Other abnormalities of gait and mobility  Muscle weakness (generalized)  Pain in left knee  Problem List Patient Active Problem List   Diagnosis Date Noted  . Osteoarthritis of right knee 09/06/2015  . Status post total right knee replacement 09/06/2015  . Cough 09/14/2012   Lulu Riding PT, DPT, LAT, ATC  12/04/2015  1:02 PM      [facility] [address] [place], [place], [address] Phone: [phone]   Fax:  [phone]  Name: Chelsea Wheeler MRN: [id number] Date of Birth: July 21, 1960

## 2015-12-09 ENCOUNTER — Ambulatory Visit: Payer: BC Managed Care – PPO | Admitting: Physical Therapy

## 2015-12-09 DIAGNOSIS — R600 Localized edema: Secondary | ICD-10-CM | POA: Diagnosis not present

## 2015-12-09 DIAGNOSIS — M25562 Pain in left knee: Secondary | ICD-10-CM

## 2015-12-09 DIAGNOSIS — R2689 Other abnormalities of gait and mobility: Secondary | ICD-10-CM

## 2015-12-09 DIAGNOSIS — M6281 Muscle weakness (generalized): Secondary | ICD-10-CM

## 2015-12-09 DIAGNOSIS — M25661 Stiffness of right knee, not elsewhere classified: Secondary | ICD-10-CM

## 2015-12-09 NOTE — Patient Instructions (Signed)
Please call if any questions.

## 2015-12-09 NOTE — Therapy (Signed)
[place], [place], [address] Phone: [phone]   Fax:  [phone]  Physical Therapy Treatment / Discharge Note  Patient Details  Name: Chelsea Wheeler MRN: [id number] Date of Birth: 31-Jan-1961 Referring Provider: Zollie Beckers MD  Encounter Date: 12/09/2015      PT End of Session - 12/09/15 1314    Visit Number 24   Number of Visits 24   Date for PT Re-Evaluation 12/16/15   PT Start Time 1155   PT Stop Time 1305   PT Time Calculation (min) 70 min   Activity Tolerance Patient tolerated treatment well   Behavior During Therapy [facility] for tasks assessed/performed      Past Medical History  Diagnosis Date  . Seasonal allergies   . Hypertension   . Arthritis     knee  . Claustrophobia   . GERD (gastroesophageal reflux disease)     Past Surgical History  Procedure Laterality Date  . Tubal ligation  08-31-06  . Endometrial ablation  2007  . Knee arthroscopy Right 03/22/2015    Procedure: ARTHROSCOPY KNEE, partial lateral meniscectomy, microfracture, chondraplasty;  Surgeon: Leanor Kail, MD;  Location: [facility] ORS;  Service: Orthopedics;  Laterality: Right;  . Total knee arthroplasty Right 09/06/2015    Procedure: RIGHT TOTAL KNEE ARTHROPLASTY;  Surgeon: Mcarthur Rossetti, MD;  Location: [facility];  Service: Orthopedics;  Laterality: Right;    There were no vitals filed for this visit.      Subjective Assessment - 12/09/15 1203    Subjective Knee is sore vs pain.  Brace is for Left knee.  Has new medication.  Tremadol.  I cancelled my Wednesday appointment.     Currently in Pain? No/denies   Pain Orientation Right;Lateral   Pain Descriptors / Indicators Sore   Pain Frequency Intermittent   Aggravating Factors  It is a little sore all the time.     Pain Relieving Factors rest, massage.  ice,  TENS            [facility] PT Assessment - 12/09/15 0001    PROM   Right Knee Extension -5    Right Knee Flexion 118                     [facility] Adult PT Treatment/Exercise - 12/09/15 1210    Knee/Hip Exercises: Stretches   Sports administrator --  2 reps 1+ minute with black band around ankle,  prone   Other Knee/Hip Stretches Standing step stretch 1st and 2nd steps , noted hamstring stretching with this too.    Knee/Hip Exercises: Aerobic   Nustep L 5 x 10 min  LE only, pushing down into extension every rep   Knee/Hip Exercises: Machines for Strengthening   Cybex Leg Press 2 legs, 2 plates 10 X,  1 leg 1.5 plates.     Other Machine Calf press 10 X, both, 1.5 plates   Knee/Hip Exercises: Supine   Quad Sets 10 reps  10+ reps with manual stretches and patellar glides   Terminal Knee Extension --  10 +,  prone   Cryotherapy   Number Minutes Cryotherapy 10 Minutes   Cryotherapy Location Knee   Type of Cryotherapy --  cold pack   Manual Therapy   Manual Therapy Soft tissue mobilization   Manual therapy comments patellar mobs, knee stretching   Passive ROM Gentle overpressure,  for both flexion and extension  PT Short Term Goals - 11/28/15 1044    PT SHORT TERM GOAL #1   Title pt will be I with initial HEP (10/25/2015)   Status Achieved   PT SHORT TERM GOAL #2   Title pt will improve her knee extension to </= -13 degrees and flexion to >/= 80 degrees with </= 5/10 pain to assist with improved knee mobility (10/25/2015)   Status Achieved   PT SHORT TERM GOAL #3   Title she will improve her R hip/knee strength to >/= 4-/5 with </= 5/10 pain to assist with functional progression (10/25/2015)   Status Achieved   PT SHORT TERM GOAL #4   Title she will be able to walk >/= 15 min with LRAD with </= 5/10 pain with +1 SBA to promote safety and improve functional gait (10/25/2015)   Status Achieved           PT Long Term Goals - 12/09/15 1316    PT LONG TERM GOAL #1   Title pt will be I with all HEP as of last visit (12/11/2015)   Baseline  independent    Time 4   Period Weeks   Status Achieved   PT LONG TERM GOAL #2   Title pt will improve her knee extension to </= -5 degrees and flexion to >/= 120 degrees to assist with functional and efficient gait pattern (12/11/2015)   Baseline -8 to 112  AROM,   PROM -5 to 118   Time 8   Period Weeks   Status Partially Met   PT LONG TERM GOAL #3   Title pt will improve her R hip/ knee strength to >/= 4/5 with </= 2/10 pain to assist with safety with prolonged walking and standing activities (11/19/2015)   Time 8   Period Weeks   Status Achieved   PT LONG TERM GOAL #4   Title pt willbe able to walk for >/= 20  minutes with no AD with </= 2/10 pain to assist with functional endurance (11/19/2015)   Time 8   Period Weeks   Status Achieved   PT LONG TERM GOAL #5   Title pt will improve her FOTO score to >/= 50 at discharge to demonstrate improvement in function at discharge (11/19/2015)   Baseline FOTO 41% limitation   Time 8   Period Weeks   Status Achieved               Plan - 12/09/15 1319    Clinical Impression Statement This is her last day with PT.  All goals met except for ROM goals which are partially met.  She plans to continue her exercises at home and plans to continue with progressing her AROM and srtength. No pain at the end of session.     PT Next Visit Plan Doscharge to home exercise program per patient and PT.   PT Home Exercise Plan continue   Consulted and Agree with Plan of Care Patient      Patient will benefit from skilled therapeutic intervention in order to improve the following deficits and impairments:  Pain, Improper body mechanics, Postural dysfunction, Increased edema, Decreased strength, Decreased mobility, Difficulty walking, Decreased range of motion, Decreased activity tolerance, Hypomobility, Decreased balance  Visit Diagnosis: Localized edema  Stiffness of right knee, not elsewhere classified  Other abnormalities of gait and  mobility  Muscle weakness (generalized)  Pain in left knee     Problem List Patient Active Problem List   Diagnosis Date Noted  .  Osteoarthritis of right knee 09/06/2015  . Status post total right knee replacement 09/06/2015  . Cough 09/14/2012    [name],[name] 12/09/2015, 1:23 PM  [facility] [address] [place], [place], [address] Phone: [phone]   Fax:  [phone]  Name: Chelsea Wheeler MRN: [id number] Date of Birth: [birth year]    Melvenia Needles, PTA 12/09/2015 1:23 PM Phone: [phone] Fax: [phone]     PHYSICAL THERAPY DISCHARGE SUMMARY  Visits from Start of Care: 24  Current functional level related to goals / functional outcomes: FOTO 41% limited    Remaining deficits: Mild limitation with extension to at -5 degrees. Intermittent swelling and stiffness noted in the R knee.    Education / Equipment: HEP, theraband for strengthening, edema control with elevation and ice  Plan: Patient agrees to discharge.  Patient goals were partially met. Patient is being discharged due to meeting the stated rehab goals.  ?????        Kristoffer Leamon PT, DPT, LAT, ATC  12/09/2015  4:18 PM

## 2015-12-11 ENCOUNTER — Ambulatory Visit: Payer: BC Managed Care – PPO | Admitting: Physical Therapy

## 2015-12-11 ENCOUNTER — Encounter: Payer: BC Managed Care – PPO | Admitting: Physical Therapy

## 2016-04-27 ENCOUNTER — Telehealth (INDEPENDENT_AMBULATORY_CARE_PROVIDER_SITE_OTHER): Payer: Self-pay | Admitting: *Deleted

## 2016-04-27 NOTE — Telephone Encounter (Signed)
Please advise 

## 2016-04-27 NOTE — Telephone Encounter (Signed)
It's been almost 8 months since her knee replacement.  I'm fine with her going back to work without any restrictions at this point.  If she needs or wants restrictions, find out what they are and include that in any note

## 2016-04-27 NOTE — Telephone Encounter (Signed)
Pt would like a return call ASAP, states is needing a Drs. Note to be more specific, needs to know if there are any restrictions and how many hrs she can work. Will go in detail with you when you call.

## 2016-04-28 ENCOUNTER — Telehealth (INDEPENDENT_AMBULATORY_CARE_PROVIDER_SITE_OTHER): Payer: Self-pay | Admitting: *Deleted

## 2016-04-28 ENCOUNTER — Encounter (INDEPENDENT_AMBULATORY_CARE_PROVIDER_SITE_OTHER): Payer: Self-pay

## 2016-04-28 NOTE — Telephone Encounter (Signed)
Pt. Called back about note. Requesting call back to discuss this with ashley. [phone]

## 2016-04-28 NOTE — Telephone Encounter (Signed)
ERROR

## 2016-04-28 NOTE — Telephone Encounter (Signed)
Faxed note to [phone] Also mailed note to patient home per her request

## 2016-06-17 ENCOUNTER — Encounter (INDEPENDENT_AMBULATORY_CARE_PROVIDER_SITE_OTHER): Payer: Self-pay | Admitting: Orthopaedic Surgery

## 2016-06-17 ENCOUNTER — Ambulatory Visit (INDEPENDENT_AMBULATORY_CARE_PROVIDER_SITE_OTHER): Payer: Self-pay

## 2016-06-17 ENCOUNTER — Encounter (INDEPENDENT_AMBULATORY_CARE_PROVIDER_SITE_OTHER): Payer: Self-pay

## 2016-06-17 ENCOUNTER — Ambulatory Visit (INDEPENDENT_AMBULATORY_CARE_PROVIDER_SITE_OTHER): Payer: BC Managed Care – PPO | Admitting: Orthopaedic Surgery

## 2016-06-17 VITALS — Ht 69.0 in | Wt 183.0 lb

## 2016-06-17 DIAGNOSIS — M25561 Pain in right knee: Secondary | ICD-10-CM | POA: Diagnosis not present

## 2016-06-17 DIAGNOSIS — Z96651 Presence of right artificial knee joint: Secondary | ICD-10-CM

## 2016-06-17 DIAGNOSIS — M25562 Pain in left knee: Secondary | ICD-10-CM | POA: Diagnosis not present

## 2016-06-17 MED ORDER — LIDOCAINE HCL 1 % IJ SOLN
3.0000 mL | INTRAMUSCULAR | Status: AC | PRN
  Administered 2016-06-17: 3 mL

## 2016-06-17 MED ORDER — METHYLPREDNISOLONE 4 MG PO TABS: ORAL_TABLET | ORAL | 0 refills | Status: DC

## 2016-06-17 MED ORDER — METHYLPREDNISOLONE ACETATE 40 MG/ML IJ SUSP
40.0000 mg | INTRAMUSCULAR | Status: AC | PRN
  Administered 2016-06-17: 40 mg via INTRA_ARTICULAR

## 2016-06-17 MED ORDER — TRAMADOL HCL 50 MG PO TABS: 50.0000 mg | ORAL_TABLET | Freq: Two times a day (BID) | ORAL | 0 refills | Status: DC | PRN

## 2016-06-17 NOTE — Progress Notes (Signed)
Office Visit Note   Patient: Chelsea Wheeler           Date of Birth: Sep 11, 1960           MRN: [id number] Visit Date: 06/17/2016              Requested by: Chelsea MinaJames Hedrick, Wheeler [address] [facility] [place], [place] [address] PCP: Chelsea MinaJames Hedrick, Wheeler   Assessment & Plan: Visit Diagnoses:  1. Acute pain of left knee   2. Acute pain of right knee   3. Status post total right knee replacement     Plan: She tolerated the steroid injection in her left knee well. She'll continue increase her activities as she tolerates. I did send and a six-day steroid taper to help overall. Gave her prescription for tramadol. I'll see her back in 3 months to see how she doing overall but no x-rays are needed of either knee.  Follow-Up Instructions: Return in about 3 months (around 09/15/2016).   Orders:  Orders Placed This Encounter  Procedures  . Large Joint Injection/Arthrocentesis  . XR Knee 1-2 Views Left  . XR Knee 1-2 Views Right   Meds ordered this encounter  Medications  . methylPREDNISolone (MEDROL) 4 MG tablet    Sig: Medrol dose pack. Take as instructed    Dispense:  21 tablet    Refill:  0  . traMADol (ULTRAM) 50 MG tablet    Sig: Take 1-2 tablets (50-100 mg total) by mouth every 12 (twelve) hours as needed.    Dispense:  90 tablet    Refill:  0      Procedures: Large Joint Inj Date/Time: 06/17/2016 1:27 PM Performed by: Chelsea Wheeler, Chelsea Wheeler Authorized by: Chelsea Wheeler, Chelsea Wheeler Wheeler   Location:  Knee Site:  L knee Ultrasound Guidance: No   Fluoroscopic Guidance: No   Arthrogram: No   Medications:  3 mL lidocaine 1 %; 40 mg methylPREDNISolone acetate 40 MG/ML     Clinical Data: No additional findings.   Subjective: Chief Complaint  Patient presents with  . Right Knee - Follow-up  . Follow-up    HPI She is now 9 months status post a left total knee arthroplasty. The knee is been swelling some and little bit stiff. Her left knee that wasn't  bothering her and she was the medial joint line of the left knee. Review of Systems He denies chest pain, headache, shortness of breath, fever, chills, nausea, vomiting.  Objective: Vital Signs: Ht 5\' 9"  (1.753 m)   Wt 183 lb (83 kg)   BMI 27.02 kg/m   Physical Exam He is alert and oriented 3 Ortho Exam Examination of her left knee shows a little bit of medial joint line tenderness with no effusion with excellent range of motion. His knee is ligamentous is stable. She has a mild patellofemoral crepitation. She has a slight varus deformity is easily correctable. Exam his right knee shows a mild effusion with no evidence infection. Her knee moves well. Is slightly stiff. Range of motion is almost full. It feels ligament was a stable. Her incisions well-healed. Specialty Comments:  No specialty comments available.  Imaging: Xr Knee 1-2 Views Left  Result Date: 06/17/2016 AP and lateral of the left knee shows mild tricompartment arthritis mainly involving the medial joint line and the patellofemoral joint. There is no effusion and is only slight varus malalignment. There is no evidence of fracture.  Xr Knee 1-2 Views Right  Result Date: 06/17/2016 An AP  and lateral of the right knee shows a well-seated implant with no, getting features. There is no evidence of loosening.    PMFS History: Patient Active Problem List   Diagnosis Date Noted  . Osteoarthritis of right knee 09/06/2015  . Status post total right knee replacement 09/06/2015  . Cough 09/14/2012   Past Medical History:  Diagnosis Date  . Arthritis    knee  . Claustrophobia   . GERD (gastroesophageal reflux disease)   . Hypertension   . Seasonal allergies     Family History  Problem Relation Age of Onset  . Allergies Sister     Past Surgical History:  Procedure Laterality Date  . ENDOMETRIAL ABLATION  2007  . KNEE ARTHROSCOPY Right 03/22/2015   Procedure: ARTHROSCOPY KNEE, partial lateral meniscectomy,  microfracture, chondraplasty;  Surgeon: Chelsea Wheeler;  Location: [facility] ORS;  Service: Orthopedics;  Laterality: Right;  . TOTAL KNEE ARTHROPLASTY Right 09/06/2015   Procedure: RIGHT TOTAL KNEE ARTHROPLASTY;  Surgeon: Chelsea Wheeler;  Location: [facility];  Service: Orthopedics;  Laterality: Right;  . TUBAL LIGATION  08-31-06   Social History   Occupational History  . Bus Driver    Social History Main Topics  . Smoking status: Never Smoker  . Smokeless tobacco: Never Used  . Alcohol use No  . Drug use: No  . Sexual activity: Not on file

## 2016-07-09 ENCOUNTER — Telehealth (INDEPENDENT_AMBULATORY_CARE_PROVIDER_SITE_OTHER): Payer: Self-pay | Admitting: Orthopaedic Surgery

## 2016-07-09 MED ORDER — METHYLPREDNISOLONE 4 MG PO TABS: ORAL_TABLET | ORAL | 0 refills | Status: DC

## 2016-07-09 NOTE — Telephone Encounter (Signed)
Please advise 

## 2016-07-09 NOTE — Telephone Encounter (Signed)
Pt requesting prednisone refill.. She said it helps with her pain.  the patient number is  [phone]

## 2016-07-09 NOTE — Telephone Encounter (Signed)
I sent it into her pharmacy

## 2016-09-15 ENCOUNTER — Ambulatory Visit (INDEPENDENT_AMBULATORY_CARE_PROVIDER_SITE_OTHER): Payer: BC Managed Care – PPO | Admitting: Orthopaedic Surgery

## 2016-09-23 ENCOUNTER — Ambulatory Visit: Payer: BC Managed Care – PPO | Admitting: Podiatry

## 2016-10-14 ENCOUNTER — Ambulatory Visit (INDEPENDENT_AMBULATORY_CARE_PROVIDER_SITE_OTHER): Payer: BC Managed Care – PPO | Admitting: Orthopaedic Surgery

## 2016-10-14 DIAGNOSIS — Z96651 Presence of right artificial knee joint: Secondary | ICD-10-CM | POA: Diagnosis not present

## 2016-10-14 DIAGNOSIS — M1712 Unilateral primary osteoarthritis, left knee: Secondary | ICD-10-CM | POA: Insufficient documentation

## 2016-10-14 MED ORDER — TIZANIDINE HCL 4 MG PO TABS: 4.0000 mg | ORAL_TABLET | Freq: Two times a day (BID) | ORAL | 1 refills | Status: DC | PRN

## 2016-10-14 NOTE — Progress Notes (Signed)
The patient is now just over a year status post a right total knee replacement. She says that she's been doing well overall and sometimes is still bothersome. Her left knee has known arthritis in it and we provided injection in that knee back this past December is about 4 months ago. She said that knee is doing well otherwise. She would like to have someone help her with pain on weekends but tramadol wasn't work and hydrocodone is too strong.  On examination of her right operative knee her range of motion is almost full at this point he feels ligamentously stable. Is no significant swelling. Her left knee shows no malalignment. There is medial joint line tenderness.  At this point we'll try some Zanaflex as a muscle relaxant, loss will also try for her. She can always get an injection in her left knee again down the road if needed. We'll see her back in 6 months to see how she doing overall. I like an AP and lateral both knees at that visit. Obviously of her we can see her sooner for injections if needed.

## 2016-11-11 ENCOUNTER — Encounter (INDEPENDENT_AMBULATORY_CARE_PROVIDER_SITE_OTHER): Payer: Self-pay

## 2016-11-11 ENCOUNTER — Telehealth (INDEPENDENT_AMBULATORY_CARE_PROVIDER_SITE_OTHER): Payer: Self-pay | Admitting: Orthopaedic Surgery

## 2016-11-11 NOTE — Telephone Encounter (Signed)
Ok

## 2016-11-11 NOTE — Telephone Encounter (Signed)
That will be fine. 

## 2016-11-11 NOTE — Telephone Encounter (Signed)
PT CALLED AN ASKED IF YOU CAN WRITE HER A LETTER FOR WORK STATING SHE CAN RETURN TO FULL DUTY PLEASE.  CAN PICK UP  [phone]

## 2016-11-11 NOTE — Telephone Encounter (Signed)
Patient aware note at front desk  

## 2016-11-13 ENCOUNTER — Ambulatory Visit: Payer: BC Managed Care – PPO | Admitting: Podiatry

## 2016-11-16 ENCOUNTER — Telehealth (INDEPENDENT_AMBULATORY_CARE_PROVIDER_SITE_OTHER): Payer: Self-pay | Admitting: Orthopaedic Surgery

## 2016-11-16 NOTE — Telephone Encounter (Signed)
Error

## 2016-12-07 ENCOUNTER — Telehealth (INDEPENDENT_AMBULATORY_CARE_PROVIDER_SITE_OTHER): Payer: Self-pay | Admitting: Orthopaedic Surgery

## 2016-12-07 NOTE — Telephone Encounter (Signed)
Patient called asked if she can get note for her part time job taking her out of work until October. The number to contact patient is [phone]

## 2016-12-07 NOTE — Telephone Encounter (Signed)
Can you advise? 

## 2016-12-08 NOTE — Telephone Encounter (Signed)
Can she come in to talk about why she needs to be out that long?

## 2016-12-08 NOTE — Telephone Encounter (Signed)
Can you make an appt for this patient? She needs appt before we can write her out that long

## 2016-12-09 NOTE — Telephone Encounter (Signed)
Patient scheduled 12/15/16 @ 8:15am with Dr Magnus IvanBlackman

## 2016-12-15 ENCOUNTER — Ambulatory Visit (INDEPENDENT_AMBULATORY_CARE_PROVIDER_SITE_OTHER): Payer: BC Managed Care – PPO | Admitting: Orthopaedic Surgery

## 2017-01-04 ENCOUNTER — Ambulatory Visit (INDEPENDENT_AMBULATORY_CARE_PROVIDER_SITE_OTHER): Payer: Self-pay

## 2017-01-04 ENCOUNTER — Ambulatory Visit (INDEPENDENT_AMBULATORY_CARE_PROVIDER_SITE_OTHER): Payer: BC Managed Care – PPO | Admitting: Orthopaedic Surgery

## 2017-01-04 DIAGNOSIS — Z96651 Presence of right artificial knee joint: Secondary | ICD-10-CM

## 2017-01-04 DIAGNOSIS — M25561 Pain in right knee: Secondary | ICD-10-CM

## 2017-01-04 DIAGNOSIS — G8929 Other chronic pain: Secondary | ICD-10-CM | POA: Diagnosis not present

## 2017-01-04 DIAGNOSIS — M65311 Trigger thumb, right thumb: Secondary | ICD-10-CM

## 2017-01-04 NOTE — Progress Notes (Signed)
Office Visit Note   Patient: Chelsea Wheeler           Date of Birth: 08-29-1960           MRN: [id number] Visit Date: 01/04/2017              Requested by: Jerl Mina, MD [address] [facility] [place], [place] [address] PCP: Jerl Mina, MD   Assessment & Plan: Visit Diagnoses:  1. Chronic pain of right knee   2. Status post total right knee replacement   3. Trigger thumb, right thumb     Plan: I see no issues as a relates to her right total knee arthroplasty and have no recommendations about her knee other than keeping it strong and bending well and working on stretching. As far as her right thumb goes I did offer an injection of a steroid in the right thumb A1 pulley and she is agreeable to this. She tolerated this injection well after discussion of the risk and benefits.  Follow-Up Instructions: Return if symptoms worsen or fail to improve.   Orders:  Orders Placed This Encounter  Procedures  . Hand/Upper Extremity Injection/Arthrocentesis  . XR Knee 1-2 Views Right   No orders of the defined types were placed in this encounter.     Procedures: Hand/UE Inj Date/Time: 01/04/2017 11:17 AM Performed by: Kathryne Hitch Authorized by: Kathryne Hitch   Condition: trigger finger   Location:  Thumb Site:  R thumb A1     Clinical Data: No additional findings.   Subjective: No chief complaint on file. The patient's well-known to me. She is 16 months out from a right total knee arthroplasty. She had the knee distal get stiff on her but she has fallen recently won the knee checked out. She says she still has some chronic pain issues with that knee. She also may look at her thumb today. On the right side shouldn't having some active triggering on her right thumb.  HPI  Review of Systems She currently denies any headache, chest pain, shortness of breath, fever, chills, nausea, vomiting.  Objective: Vital Signs: There were no  vitals taken for this visit.  Physical Exam She is alert or 3 and in no acute distress Ortho Exam Examination of her right thumb shows pain over the A1 pulley. She is neurovascular intact. The right thumb is ligamentously stable. There is active triggering. Examination of her right knee shows full range of motion with no effusion. Incisions well-healed. The knee is ligamentously stable. Specialty Comments:  No specialty comments available.  Imaging: Xr Knee 1-2 Views Right  Result Date: 01/04/2017 An AP and lateral of the right knee shows a total knee arthroplasty with no, getting features. There is no evidence of loosening. There is no evidence of acute changes. Alignment is well maintained. There is no evidence of ostial lysis or wear.    PMFS History: Patient Active Problem List   Diagnosis Date Noted  . Unilateral primary osteoarthritis, left knee 10/14/2016  . Osteoarthritis of right knee 09/06/2015  . Status post total right knee replacement 09/06/2015  . Cough 09/14/2012   Past Medical History:  Diagnosis Date  . Arthritis    knee  . Claustrophobia   . GERD (gastroesophageal reflux disease)   . Hypertension   . Seasonal allergies     Family History  Problem Relation Age of Onset  . Allergies Sister     Past Surgical History:  Procedure Laterality  Date  . ENDOMETRIAL ABLATION  2007  . KNEE ARTHROSCOPY Right 03/22/2015   Procedure: ARTHROSCOPY KNEE, partial lateral meniscectomy, microfracture, chondraplasty;  Surgeon: Erin SonsHarold Kernodle, MD;  Location: [facility];  Service: Orthopedics;  Laterality: Right;  . TOTAL KNEE ARTHROPLASTY Right 09/06/2015   Procedure: RIGHT TOTAL KNEE ARTHROPLASTY;  Surgeon: Kathryne Hitchhristopher Y Kenisha Lynds, MD;  Location: [facility];  Service: Orthopedics;  Laterality: Right;  . TUBAL LIGATION  08-31-06   Social History   Occupational History  . Bus Driver    Social History Main Topics  . Smoking status: Never Smoker  . Smokeless tobacco: Never Used  .  Alcohol use No  . Drug use: No  . Sexual activity: Not on file

## 2017-03-16 ENCOUNTER — Telehealth (INDEPENDENT_AMBULATORY_CARE_PROVIDER_SITE_OTHER): Payer: Self-pay | Admitting: Orthopaedic Surgery

## 2017-03-16 NOTE — Telephone Encounter (Signed)
Patient has some questions about her knee and was wanting to speak with you. CB # [phone]

## 2017-03-17 ENCOUNTER — Ambulatory Visit (INDEPENDENT_AMBULATORY_CARE_PROVIDER_SITE_OTHER): Payer: BC Managed Care – PPO | Admitting: Orthopaedic Surgery

## 2017-03-18 NOTE — Telephone Encounter (Signed)
Called patient several times, no answer and her VM is full

## 2017-03-23 ENCOUNTER — Telehealth (INDEPENDENT_AMBULATORY_CARE_PROVIDER_SITE_OTHER): Payer: Self-pay

## 2017-03-23 NOTE — Telephone Encounter (Signed)
Patient was returning your call.  Cb# is [phone].

## 2017-03-23 NOTE — Telephone Encounter (Signed)
Can we ask her whats going on first please?

## 2017-03-23 NOTE — Telephone Encounter (Signed)
Patient asking for Rx Tramadol

## 2017-03-23 NOTE — Telephone Encounter (Signed)
Yes. Find out what she needs.

## 2017-03-23 NOTE — Telephone Encounter (Signed)
Patient would like you to give her a call please.  Cb# is [phone].  Thank You.

## 2017-03-24 ENCOUNTER — Other Ambulatory Visit (INDEPENDENT_AMBULATORY_CARE_PROVIDER_SITE_OTHER): Payer: Self-pay

## 2017-03-24 MED ORDER — TRAMADOL HCL 50 MG PO TABS: 50.0000 mg | ORAL_TABLET | Freq: Three times a day (TID) | ORAL | 0 refills | Status: DC | PRN

## 2017-03-24 NOTE — Telephone Encounter (Signed)
Ok to call in tramadol, , 1-2 every 8-12 hours as needed, #60

## 2017-03-24 NOTE — Telephone Encounter (Signed)
Called into phaarmacy

## 2017-04-07 ENCOUNTER — Ambulatory Visit (INDEPENDENT_AMBULATORY_CARE_PROVIDER_SITE_OTHER): Payer: BC Managed Care – PPO | Admitting: Orthopaedic Surgery

## 2017-04-07 DIAGNOSIS — M65312 Trigger thumb, left thumb: Secondary | ICD-10-CM | POA: Diagnosis not present

## 2017-04-07 DIAGNOSIS — M79642 Pain in left hand: Secondary | ICD-10-CM

## 2017-04-07 MED ORDER — LIDOCAINE HCL 1 % IJ SOLN
1.0000 mL | INTRAMUSCULAR | Status: AC | PRN
  Administered 2017-04-07: 1 mL

## 2017-04-07 MED ORDER — METHYLPREDNISOLONE ACETATE 40 MG/ML IJ SUSP
40.0000 mg | INTRAMUSCULAR | Status: AC | PRN
  Administered 2017-04-07: 40 mg

## 2017-04-07 NOTE — Progress Notes (Signed)
Office Visit Note   Patient: Chelsea Wheeler           Date of Birth: 30-Mar-1961           MRN: [id number] Visit Date: 04/07/2017              Requested by: Jerl Mina, MD [address] [facility] [place], [place] [address] PCP: Jerl Mina, MD   Assessment & Plan: Visit Diagnoses:  1. Pain in left hand   2. Trigger thumb, left thumb     Plan: Due to the pain over the left thumb A1 pulley and the fact that she says it triggers I did provide an injection in this area. I would also like to send her for nerve conduction studies based on clinically what she is describing and symptomatically what she is describing and worsening on clinical exam. The severe left upper extremity to rule out carpal tunnel syndrome. She is agreeable to this as well. We'll see her back after the studies have been done.  Follow-Up Instructions: Return in about 3 weeks (around 04/28/2017).   Orders:  Orders Placed This Encounter  Procedures  . Hand/Upper Extremity Injection/Arthrocentesis   No orders of the defined types were placed in this encounter.     Procedures: Hand/UE Inj Date/Time: 04/07/2017 1:21 PM Performed by: Kathryne Hitch Authorized by: Kathryne Hitch   Condition: trigger finger   Location:  Thumb Site:  L thumb A1 Medications:  1 mL lidocaine 1 %; 40 mg methylPREDNISolone acetate 40 MG/ML     Clinical Data: No additional findings.   Subjective: No chief complaint on file. The patient is here for mainly her left hand today. She is chief complaint of pain in her thumb but also weak pinching grip strength. She is right-hand dominant. She says that the sensation is just different in that hand compared to her right side. She also has pain over the A1 pulley that she points over her left thumb and says it does lock on occasion. She does report some numbness in that hand but it took a lot of talking to her to figure other that's potentially what's  going on. She does wear wrist splint. He can be worse sometimes in the morning. It does affect the way she drops inhales objects on that side.  HPI  Review of Systems She currently denies any headache, chest pain, shortness of breath, fever, chills, nausea, vomiting.  Objective: Vital Signs: There were no vitals taken for this visit.  Physical Exam She is alert and oriented 3 and in no acute distress Ortho Exam Examination of the left hand shows pain over the left thumb A1 pulley but I could not get it to trigger on her thumb. She does seem to have a weak pinching grip strength but no muscle atrophy in that hand. It started truly tell whether she does have a sensory deficit in the median nerve distribution. Specialty Comments:  No specialty comments available.  Imaging: No results found.   PMFS History: Patient Active Problem List   Diagnosis Date Noted  . Unilateral primary osteoarthritis, left knee 10/14/2016  . Osteoarthritis of right knee 09/06/2015  . Status post total right knee replacement 09/06/2015  . Cough 09/14/2012   Past Medical History:  Diagnosis Date  . Arthritis    knee  . Claustrophobia   . GERD (gastroesophageal reflux disease)   . Hypertension   . Seasonal allergies     Family History  Problem Relation Age of Onset  . Allergies Sister     Past Surgical History:  Procedure Laterality Date  . ENDOMETRIAL ABLATION  2007  . KNEE ARTHROSCOPY Right 03/22/2015   Procedure: ARTHROSCOPY KNEE, partial lateral meniscectomy, microfracture, chondraplasty;  Surgeon: Erin Sons, MD;  Location: [facility];  Service: Orthopedics;  Laterality: Right;  . TOTAL KNEE ARTHROPLASTY Right 09/06/2015   Procedure: RIGHT TOTAL KNEE ARTHROPLASTY;  Surgeon: Kathryne Hitch, MD;  Location: [facility];  Service: Orthopedics;  Laterality: Right;  . TUBAL LIGATION  08-31-06   Social History   Occupational History  . Bus Driver    Social History Main Topics  . Smoking  status: Never Smoker  . Smokeless tobacco: Never Used  . Alcohol use No  . Drug use: No  . Sexual activity: Not on file

## 2017-04-08 ENCOUNTER — Other Ambulatory Visit (INDEPENDENT_AMBULATORY_CARE_PROVIDER_SITE_OTHER): Payer: Self-pay

## 2017-04-08 DIAGNOSIS — M79642 Pain in left hand: Secondary | ICD-10-CM

## 2017-04-15 ENCOUNTER — Ambulatory Visit (INDEPENDENT_AMBULATORY_CARE_PROVIDER_SITE_OTHER): Payer: BC Managed Care – PPO | Admitting: Orthopaedic Surgery

## 2017-04-16 ENCOUNTER — Ambulatory Visit (INDEPENDENT_AMBULATORY_CARE_PROVIDER_SITE_OTHER): Payer: BC Managed Care – PPO

## 2017-04-16 ENCOUNTER — Ambulatory Visit (INDEPENDENT_AMBULATORY_CARE_PROVIDER_SITE_OTHER): Payer: BC Managed Care – PPO | Admitting: Podiatry

## 2017-04-16 ENCOUNTER — Ambulatory Visit: Payer: BC Managed Care – PPO | Admitting: Podiatry

## 2017-04-16 ENCOUNTER — Encounter: Payer: Self-pay | Admitting: Podiatry

## 2017-04-16 VITALS — BP 123/77 | HR 68 | Resp 16

## 2017-04-16 DIAGNOSIS — B353 Tinea pedis: Secondary | ICD-10-CM | POA: Diagnosis not present

## 2017-04-16 DIAGNOSIS — L84 Corns and callosities: Secondary | ICD-10-CM

## 2017-04-16 DIAGNOSIS — M2042 Other hammer toe(s) (acquired), left foot: Secondary | ICD-10-CM | POA: Diagnosis not present

## 2017-04-16 MED ORDER — KETOCONAZOLE 2 % EX FOAM: CUTANEOUS | 0 refills | Status: DC

## 2017-04-16 NOTE — Progress Notes (Signed)
Subjective:  Patient ID: Chelsea Wheeler, female    DOB: Feb 16, 1961,  MRN: [id number]  Chief Complaint  Patient presents with  . Toe Pain    Between 4th and 5th toe left - sharp sensations, callused, macerated skin x few weeks, tried corn remover, soaking - no help, shoes squeeze   56 y.o. female presents with the above complaint. Reports callus between her left fourth and fifth toes. States that she has tried corn remover which she believes caused the issue. Endorses soaking in Epsom salt. Has tried changing shoe gear. States the area is painful especially when squeezed. Also complains of left fifth toe corn which has failed attempts at padding.   Past Medical History:  Diagnosis Date  . Arthritis    knee  . Claustrophobia   . GERD (gastroesophageal reflux disease)   . Hypertension   . Seasonal allergies    Past Surgical History:  Procedure Laterality Date  . ENDOMETRIAL ABLATION  2007  . KNEE ARTHROSCOPY Right 03/22/2015   Procedure: ARTHROSCOPY KNEE, partial lateral meniscectomy, microfracture, chondraplasty;  Surgeon: Erin SonsHarold Kernodle, MD;  Location: [facility];  Service: Orthopedics;  Laterality: Right;  . TOTAL KNEE ARTHROPLASTY Right 09/06/2015   Procedure: RIGHT TOTAL KNEE ARTHROPLASTY;  Surgeon: Kathryne Hitchhristopher Y Blackman, MD;  Location: [facility];  Service: Orthopedics;  Laterality: Right;  . TUBAL LIGATION  08-31-06    Current Outpatient Prescriptions:  .  cetirizine (ZYRTEC) 10 MG tablet, Take 10 mg by mouth daily. , Disp: , Rfl:  .  Cholecalciferol (VITAMIN D3 PO), Take 1 tablet by mouth daily. , Disp: , Rfl:  .  diazepam (VALIUM) 5 MG tablet, Take 1 tablet (5 mg total) by mouth 2 (two) times daily. (Patient not taking: Reported on 08/28/2015), Disp: 6 tablet, Rfl: 0 .  EPINEPHrine (EPIPEN 2-PAK) 0.3 mg/0.3 mL IJ SOAJ injection, Inject 0.3 mg into the muscle once. , Disp: , Rfl:  .  esomeprazole (NEXIUM) 40 MG capsule, Take 40 mg by mouth every morning. , Disp: , Rfl:  .   fluticasone (FLONASE) 50 MCG/ACT nasal spray, Place 1 spray into both nostrils daily as needed for allergies or rhinitis. , Disp: , Rfl:  .  hydrochlorothiazide (HYDRODIURIL) 25 MG tablet, Take 25 mg by mouth every morning. , Disp: , Rfl:  .  HYDROcodone-acetaminophen (NORCO/VICODIN) 5-325 MG per tablet, Take 1 tablet by mouth every 6 (six) hours as needed for moderate pain. (Patient not taking: Reported on 08/28/2015), Disp: 20 tablet, Rfl: 0 .  HYDROmorphone (DILAUDID) 2 MG tablet, Take 1 tablet (2 mg total) by mouth every 4 (four) hours as needed for severe pain., Disp: 90 tablet, Rfl: 0 .  methocarbamol (ROBAXIN) 500 MG tablet, Take 1 tablet (500 mg total) by mouth every 6 (six) hours as needed for muscle spasms., Disp: 60 tablet, Rfl: 0 .  methylPREDNISolone (MEDROL) 4 MG tablet, Medrol dose pack. Take as instructed, Disp: 21 tablet, Rfl: 0 .  Multiple Vitamins-Minerals (ANTIOXIDANT FORMULA SG) capsule, Take 1 capsule by mouth daily., Disp: , Rfl:  .  NORCO 5-325 MG per tablet, Take 1-2 tablets by mouth every 6 (six) hours as needed for moderate pain. MAXIMUM TOTAL ACETAMINOPHEN DOSE IS 4000 MG PER DAY (Patient not taking: Reported on 08/28/2015), Disp: 25 tablet, Rfl: 0 .  potassium chloride (K-DUR) 10 MEQ tablet, Take 10 mEq by mouth 2 (two) times daily., Disp: , Rfl:  .  PRESCRIPTION MEDICATION, Inject 1 cartridge into the skin 2 (two) times a week. ALLERGY SHOT  Mondays and Thursdays, Disp: , Rfl:  .  rivaroxaban (XARELTO) 10 MG TABS tablet, Take 1 tablet (10 mg total) by mouth daily with breakfast. (Patient not taking: Reported on 09/24/2015), Disp: 20 tablet, Rfl: 0 .  tiZANidine (ZANAFLEX) 4 MG tablet, Take 1 tablet (4 mg total) by mouth 2 (two) times daily as needed for muscle spasms., Disp: 60 tablet, Rfl: 1 .  traMADol (ULTRAM) 50 MG tablet, Take 1-2 tablets (50-100 mg total) by mouth every 8 (eight) hours as needed., Disp: 60 tablet, Rfl: 0 .  zolpidem (AMBIEN CR) 12.5 MG CR tablet, Take 12.5  mg by mouth at bedtime as needed for sleep., Disp: , Rfl:   Allergies  Allergen Reactions  . Shellfish Allergy Anaphylaxis  . Tylenol With Codeine #3 [Acetaminophen-Codeine] Other (See Comments)    "MADE ME FEEL HORRIBLE"  . Lodine [Etodolac] Rash  . Naproxen Rash  . Penicillins Rash    .Marland KitchenHas patient had a PCN reaction causing immediate rash, facial/tongue/throat swelling, SOB or lightheadedness with hypotension: No Has patient had a PCN reaction causing severe rash involving mucus membranes or skin necrosis: No Has patient had a PCN reaction that required hospitalization No Has patient had a PCN reaction occurring within the last 10 years: No If all of the above answers are "NO", then may proceed with Cephalosporin use.     Review of Systems  All other systems reviewed and are negative.  Objective:   Vitals:   04/16/17 1044  BP: 123/77  Pulse: 68  Resp: 16   General AA&O x3. Normal mood and affect.  Vascular Dorsalis pedis and posterior tibial pulses  present 2+ bilaterally  Capillary refill normal to all digits. Pedal hair growth normal.  Neurologic Epicritic sensation grossly present.  Dermatologic No open lesions. L 4th interspace maceration. Nails well groomed and normal in appearance.  Orthopedic: MMT 5/5 in dorsiflexion, plantarflexion, inversion, and eversion. Normal joint ROM without pain or crepitus. L 5th adductovarus deformity with PIPJ corn. Pain to palpation    Radiographs: Taken and reviewed. Lesser digital contractures Assessment & Plan:  Patient was evaluated and treated and all questions answered.  Hammertoe L 5th Toe -Tubefoam dispensed -Patient wishes to discuss surgical intervention for her left fifth hammertoe. All risk benefits and alternatives discussed. Patient wishes to proceed. Consent signed and reviewed. Patient will prefer to have surgery in the office. Discussed that she will need to wear postoperative shoe area patient states that she can  do this with her works along she has a note.  Interdigital maceration left fourth interspace -Educated on proper hygiene -RX ketoconazole foam for drying component

## 2017-04-19 ENCOUNTER — Telehealth (INDEPENDENT_AMBULATORY_CARE_PROVIDER_SITE_OTHER): Payer: Self-pay | Admitting: Orthopaedic Surgery

## 2017-04-19 ENCOUNTER — Telehealth: Payer: Self-pay | Admitting: Podiatry

## 2017-04-19 NOTE — Telephone Encounter (Signed)
Pt states her supervisor said the surgical shoe would be okay, since there is a covered heel, and Dr. Samuella CotaPrice said she could go back to work, because she would only have anesthesia in the toe. Pt asked if she needed to not eat before. I told her she should eat light nothing greasy or heavy, pt stated understanding. I told her if she needed a note to got back to work to ask the surgical assistant with Dr. Samuella CotaPrice. Pt states she told Dr. Samuella CotaPrice she didn't want to be out of work and he said she could return same day.

## 2017-04-19 NOTE — Telephone Encounter (Signed)
This message is for Dr. Kandice HamsPrice's nurse. Phone number is [phone].

## 2017-04-19 NOTE — Telephone Encounter (Signed)
Called patient and rescheduled.

## 2017-04-19 NOTE — Telephone Encounter (Signed)
Patient is calling to reschedule her nerve study with Dr. Alvester MorinNewton. She states she had a family emergency and was wanting to reschedule for 2 weeks out on a Friday if possible. CB # [phone]

## 2017-04-20 NOTE — Telephone Encounter (Signed)
Patient can go back to work after the procedure. She must wear her surgical shoe at work.

## 2017-04-22 ENCOUNTER — Ambulatory Visit (INDEPENDENT_AMBULATORY_CARE_PROVIDER_SITE_OTHER): Payer: BC Managed Care – PPO | Admitting: Podiatry

## 2017-04-22 ENCOUNTER — Encounter: Payer: Self-pay | Admitting: Podiatry

## 2017-04-22 ENCOUNTER — Ambulatory Visit (INDEPENDENT_AMBULATORY_CARE_PROVIDER_SITE_OTHER): Payer: BC Managed Care – PPO

## 2017-04-22 DIAGNOSIS — M2042 Other hammer toe(s) (acquired), left foot: Secondary | ICD-10-CM | POA: Diagnosis not present

## 2017-04-22 MED ORDER — HYDROCODONE-ACETAMINOPHEN 5-325 MG PO TABS: 1.0000 | ORAL_TABLET | Freq: Four times a day (QID) | ORAL | 0 refills | Status: DC | PRN

## 2017-04-22 MED ORDER — CLINDAMYCIN HCL 300 MG PO CAPS: 300.0000 mg | ORAL_CAPSULE | Freq: Two times a day (BID) | ORAL | 0 refills | Status: DC

## 2017-04-23 ENCOUNTER — Encounter (INDEPENDENT_AMBULATORY_CARE_PROVIDER_SITE_OTHER): Payer: BC Managed Care – PPO | Admitting: Physical Medicine and Rehabilitation

## 2017-04-23 NOTE — Progress Notes (Signed)
Patient Name: Chelsea Wheeler DOB: [birth year]  MRN: [id number]  Date of Service: 04/23/17  Surgeon: Dr. Ventura SellersMichael Enola Siebers, DPM Assistants: Hadley PenLisa Cox (circulating) Pre-operative Diagnosis: Hammertoe L 5th toe Post-operative Diagnosis: Same Procedures:             1) Correction Hammertoe L 5th toe Pathology/Specimens: None Anesthesia: Marcaine with Epinephrine - 10 ccs reverse Mayo block Hemostasis: Anatomic Estimated Blood Loss: trace Materials: None Medications: None Complications: None  Indications for Procedure: Patient presented to the office last week with chief complaint of left fifth toe pain.  Patient had a painful callus with associated hammertoe deformity.  She has tried all conservative therapy and wishes to proceed with surgical correction  Procedure in Detail: Patient presented today for correction of her L 5th hammertoe.  She wished to have the procedure done in the office.  Patient states she was to take minimal time off of work and wanted to go back to work after the procedure.  Patient told that while this would not be my full recommendation she would be able to do so as the procedure can just be done under local anesthesia.  All risk benefits and alternatives were discussed with the patient.  No guarantees were given.  Patient wished to proceed with surgical correction.  Formal consent was reviewed and signed by the patient.  Patient was given a local block as above and after ensuring anesthesia patient was brought back to the procedure room. A pneumatic ankle tourniquet was placed on the ankle. The foot was then prepped and draped in usual sterile fashion.  Tendon was then directed to the left fifth toe where a linear incision was made overlying the left proximal interphalangeal joint.  She was carried down through the skin and subcu tissue to the level of the joint capsule. A linear ncision was made in the capsule and all ligamentous attachments to the proximal phalanx head  were freed from the bone.  A sharp bone cutter was used to excise the proximal phalangeal head.  The area was then thoroughly irrigated the capsule was repaired with a single stitch of 3-0 Vicryl and the skin closed with 4-0 nylon. The foot was then dressed with 4x4, kerlix, and ACE bandage. Patient tolerated the procedure well.  Disposition: Patient was placed in a surgical shoe. XR were taken post-procedure. Patient will follow-up in one week for a post-op check.

## 2017-04-28 ENCOUNTER — Ambulatory Visit (INDEPENDENT_AMBULATORY_CARE_PROVIDER_SITE_OTHER): Payer: BC Managed Care – PPO | Admitting: Orthopaedic Surgery

## 2017-04-28 ENCOUNTER — Encounter: Payer: BC Managed Care – PPO | Admitting: Podiatry

## 2017-04-30 ENCOUNTER — Ambulatory Visit (INDEPENDENT_AMBULATORY_CARE_PROVIDER_SITE_OTHER): Payer: BC Managed Care – PPO | Admitting: Podiatry

## 2017-04-30 VITALS — BP 112/68 | HR 68 | Temp 98.7°F

## 2017-04-30 DIAGNOSIS — Z9889 Other specified postprocedural states: Secondary | ICD-10-CM

## 2017-04-30 MED ORDER — HYDROCODONE-ACETAMINOPHEN 5-325 MG PO TABS: 1.0000 | ORAL_TABLET | Freq: Four times a day (QID) | ORAL | 0 refills | Status: DC | PRN

## 2017-05-02 NOTE — Progress Notes (Signed)
  Subjective:  Patient ID: Chelsea Wheeler, female    DOB: 03-04-1961,  MRN: [id number]  Chief Complaint  Patient presents with  . Routine Post Op    1 week hammertoe 5th Left   DOS: 04/22/17 Procedure: L 5th hammertoe correction.  56 y.o. female returns for post-op check. Denies N/V/F/Ch. Pain is controlled with current medications.  Objective:   General AA&O x3. Normal mood and affect.  Vascular Foot warm and well perfused.  Neurologic Gross sensation intact.  Dermatologic Skin healing well without signs of infection. Skin edges well coapted without signs of infection.  Orthopedic: Tenderness to palpation noted about the surgical site.    Assessment & Plan:  Patient was evaluated and treated and all questions answered.  S/p L 5th hammertoe correction. -Progressing as expected post-operatively. -Sutures: left intact. Will remove at next visit. -Medications refilled: Norco -Foot redressed.  Return in about 1 week (around 05/07/2017) for Post-op.

## 2017-05-05 ENCOUNTER — Encounter: Payer: Self-pay | Admitting: Podiatry

## 2017-05-05 ENCOUNTER — Ambulatory Visit (INDEPENDENT_AMBULATORY_CARE_PROVIDER_SITE_OTHER): Payer: BC Managed Care – PPO | Admitting: Podiatry

## 2017-05-05 DIAGNOSIS — Z9889 Other specified postprocedural states: Secondary | ICD-10-CM

## 2017-05-14 ENCOUNTER — Encounter (INDEPENDENT_AMBULATORY_CARE_PROVIDER_SITE_OTHER): Payer: BC Managed Care – PPO | Admitting: Physical Medicine and Rehabilitation

## 2017-05-19 ENCOUNTER — Ambulatory Visit (INDEPENDENT_AMBULATORY_CARE_PROVIDER_SITE_OTHER): Payer: BC Managed Care – PPO | Admitting: Podiatry

## 2017-05-19 ENCOUNTER — Encounter: Payer: Self-pay | Admitting: Podiatry

## 2017-05-19 DIAGNOSIS — B353 Tinea pedis: Secondary | ICD-10-CM

## 2017-05-19 DIAGNOSIS — L988 Other specified disorders of the skin and subcutaneous tissue: Secondary | ICD-10-CM

## 2017-05-19 DIAGNOSIS — Z9889 Other specified postprocedural states: Secondary | ICD-10-CM

## 2017-05-19 DIAGNOSIS — M2042 Other hammer toe(s) (acquired), left foot: Secondary | ICD-10-CM

## 2017-05-19 NOTE — Progress Notes (Signed)
  Subjective:  Patient ID: Chelsea Wheeler, female    DOB: 10/05/1960,  MRN: [id number]  Chief Complaint  Patient presents with  . Routine Post Op    POV#2 - DOS 04-22-17  Hammertoe repair 5th toe left   "Still sore but its on the inside of the toe"   DOS: 04/22/17 Procedure: L 5th hammertoe correction  56 y.o. female returns for post-op check. Denies N/V/F/Ch.  Not having pain from the surgical site.  Is back in normal shoe gear.  Is pleased with results thus far. Still complaining of pain and moistening of the interspace.  Objective:   General AA&O x3. Normal mood and affect.  Vascular Foot warm and well perfused.  Neurologic Gross sensation intact.  Dermatologic Skin healing well without signs of infection. Skin edges well coapted without signs of infection. Interdigital maceration noted left fourth interspace  Orthopedic: No tenderness to palpation noted about the surgical site.   Assessment & Plan:  Patient was evaluated and treated and all questions answered.  S/p L 5th HT correction -Progressing as expected post-operatively. -Skin well-healed. -Minimal pain to palpation  Interdigital maceration -Betadine applied.  Advised on application daily -Educated on hygiene including drying between the toe after bathing.  Return in about 3 weeks (around 06/09/2017) for Post-op.

## 2017-05-23 NOTE — Progress Notes (Signed)
  Subjective:  Patient ID: Lenox AhrBridget M Dun, female    DOB: 1961-04-19,  MRN: [id number]  Chief Complaint  Patient presents with  . Routine Post Op    i am doing good on my 5th toe on my left foot after surgery that was done    DOS: 04/22/17 Procedure: L 5th hammertoe correction  56 y.o. female returns for post-op check. Denies N/V/F/Ch. Pain is controlled with current medications.  Objective:   General AA&O x3. Normal mood and affect.  Vascular Foot warm and well perfused.  Neurologic Gross sensation intact.  Dermatologic Skin healing well without signs of infection. Skin edges well coapted without signs of infection.  Orthopedic: Slight tenderness to palpation noted about the surgical site.    Assessment & Plan:  Patient was evaluated and treated and all questions answered.  S/p L 5th hammertoe correction -Progressing as expected post-operatively. -Sutures: removed. Ok to shower but not soak.. -Medications refilled: none  Return in about 2 weeks (around 05/19/2017) for Post-op.

## 2017-06-01 ENCOUNTER — Telehealth: Payer: Self-pay | Admitting: Podiatry

## 2017-06-01 NOTE — Telephone Encounter (Signed)
I need Dr. Kandice HamsPrice's nurse to call me back at [phone]. Thank you.

## 2017-06-02 NOTE — Telephone Encounter (Signed)
Attempting to leave a VM but mail box was full

## 2017-06-03 ENCOUNTER — Telehealth: Payer: Self-pay | Admitting: Podiatry

## 2017-06-03 NOTE — Telephone Encounter (Signed)
Pt needs letter for work, so she can park closer. Pt will pick note up 06/04/17 in the [place] office.

## 2017-06-03 NOTE — Telephone Encounter (Signed)
Good morning. Someone called me this morning but I was unable to answer. I will try to answer when you call back. My number is [phone]. Thanks.

## 2017-06-03 NOTE — Telephone Encounter (Signed)
Unable to leave voicemail/mailbox full.

## 2017-06-04 ENCOUNTER — Encounter (INDEPENDENT_AMBULATORY_CARE_PROVIDER_SITE_OTHER): Payer: BC Managed Care – PPO | Admitting: Physical Medicine and Rehabilitation

## 2017-06-04 ENCOUNTER — Encounter: Payer: Self-pay | Admitting: Podiatry

## 2017-06-09 ENCOUNTER — Encounter: Payer: BC Managed Care – PPO | Admitting: Podiatry

## 2017-06-17 ENCOUNTER — Encounter: Payer: BC Managed Care – PPO | Admitting: Podiatry

## 2017-06-25 ENCOUNTER — Encounter: Payer: BC Managed Care – PPO | Admitting: Podiatry

## 2017-07-01 ENCOUNTER — Encounter: Payer: BC Managed Care – PPO | Admitting: Podiatry

## 2017-08-02 ENCOUNTER — Telehealth (INDEPENDENT_AMBULATORY_CARE_PROVIDER_SITE_OTHER): Payer: Self-pay | Admitting: Orthopaedic Surgery

## 2017-08-02 NOTE — Telephone Encounter (Signed)
Patient called stating that she has developed knots in the area where she received the cortisone injections.  She wants to know if this is normal or not.  Please call patient at [phone].  Thank you.

## 2017-08-02 NOTE — Telephone Encounter (Signed)
See below

## 2017-08-02 NOTE — Telephone Encounter (Signed)
Certainly injections can cause reactions around the skin such as color changes and even swelling or scarring.  I recommended she massage this area and even try Aspercreme with lidocaine to rub into the area.  We would then need to see her back sometime in the next few weeks.

## 2017-08-05 NOTE — Telephone Encounter (Signed)
Tried call patient several times no answer

## 2017-10-06 ENCOUNTER — Telehealth (INDEPENDENT_AMBULATORY_CARE_PROVIDER_SITE_OTHER): Payer: Self-pay | Admitting: *Deleted

## 2017-10-06 NOTE — Telephone Encounter (Signed)
See message below  Please advise

## 2017-10-06 NOTE — Telephone Encounter (Signed)
Will we had seen her back in October and I recommended nerve conduction studies on the left side because that is where she was more symptomatic.  Certainly we can obtain nerve conduction studies of the left and right sides to assess for nerve impingement based on her numbness and tingling.  There is really nothing else that I can do until we have these nerve conduction studies to find out what the numbness and tingling is coming from.  Please encourage her to have bilateral upper extremity nerve conduction studies performed to rule out carpal tunnel syndrome.

## 2017-10-07 NOTE — Telephone Encounter (Signed)
See message below °

## 2017-10-11 NOTE — Telephone Encounter (Signed)
Pt is scheduled for 11/05/17

## 2017-11-05 ENCOUNTER — Telehealth (INDEPENDENT_AMBULATORY_CARE_PROVIDER_SITE_OTHER): Payer: Self-pay | Admitting: *Deleted

## 2017-11-05 ENCOUNTER — Encounter (INDEPENDENT_AMBULATORY_CARE_PROVIDER_SITE_OTHER): Payer: Self-pay | Admitting: Physical Medicine and Rehabilitation

## 2017-11-05 ENCOUNTER — Ambulatory Visit (INDEPENDENT_AMBULATORY_CARE_PROVIDER_SITE_OTHER): Payer: BC Managed Care – PPO | Admitting: Physical Medicine and Rehabilitation

## 2017-11-05 ENCOUNTER — Other Ambulatory Visit (INDEPENDENT_AMBULATORY_CARE_PROVIDER_SITE_OTHER): Payer: Self-pay | Admitting: Orthopaedic Surgery

## 2017-11-05 DIAGNOSIS — R202 Paresthesia of skin: Secondary | ICD-10-CM

## 2017-11-05 MED ORDER — TRAMADOL HCL 50 MG PO TABS: 50.0000 mg | ORAL_TABLET | Freq: Three times a day (TID) | ORAL | 0 refills | Status: DC | PRN

## 2017-11-05 NOTE — Telephone Encounter (Signed)
Can you please advise? Thanks.

## 2017-11-05 NOTE — Telephone Encounter (Signed)
I have not seen her since October and even then and recently recommended nerve conduction studies to rule out carpal tunnel syndrome.  I will send in at least a stronger anti-inflammatory in to her pharmacy, but can not prescribe anything else until we have a diagnosis.

## 2017-11-05 NOTE — Progress Notes (Signed)
 .  Numeric Pain Rating Scale and Functional Assessment Average Pain 7   In the last MONTH (on 0-10 scale) has pain interfered with the following?  1. General activity like being  able to carry out your everyday physical activities such as walking, climbing stairs, carrying groceries, or moving a chair?  Rating(5)   

## 2017-11-05 NOTE — Telephone Encounter (Signed)
IC patient and she actually had the NCV today, with [place].  I see you sent in tramadol.  I have advised her.  She has an appt on 5/28 for review of NCV.

## 2017-11-08 ENCOUNTER — Telehealth (INDEPENDENT_AMBULATORY_CARE_PROVIDER_SITE_OTHER): Payer: Self-pay | Admitting: Orthopaedic Surgery

## 2017-11-08 NOTE — Telephone Encounter (Signed)
Patient called asked for a call back concerning a question on her lt hand. The number to contact patient is [phone]

## 2017-11-08 NOTE — Procedures (Signed)
EMG & NCV Findings: Evaluation of the left median motor nerve showed prolonged distal onset latency (4.8 ms) and decreased conduction velocity (Elbow-Wrist, 47 m/s).  The left median (across palm) sensory nerve showed prolonged distal peak latency (Wrist, 4.8 ms) and prolonged distal peak latency (Palm, 2.6 ms).  All remaining nerves (as indicated in the following tables) were within normal limits.    Needle evaluation of the left abductor pollicis brevis muscle showed increased insertional activity.  All remaining muscles (as indicated in the following table) showed no evidence of electrical instability.    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   There is no significant electrodiagnostic evidence of any other focal nerve entrapment, brachial plexopathy or cervical radiculopathy.    As you know, this particular electrodiagnostic study cannot rule out chemical radiculitis or sensory only radiculopathy.  Recommendations: 1.  Follow-up with referring physician. 2.  Continue current management of symptoms. 3.  Continue use of resting splint at night-time and as needed during the day. 4.  Suggest surgical evaluation.  Nerve Conduction Studies Anti Sensory Summary Table   Stim Site NR Peak (ms) Norm Peak (ms) P-T Amp (V) Norm P-T Amp Site1 Site2 Delta-P (ms) Dist (cm) Vel (m/s) Norm Vel (m/s)  Left Median Acr Palm Anti Sensory (2nd Digit)  32.1C  Wrist    *4.8 <3.6 10.5 >10 Wrist Palm 2.2 0.0    Palm    *2.6 <2.0 0.3         Left Radial Anti Sensory (Base 1st Digit)  32.5C  Wrist    2.1 <3.1 22.3  Wrist Base 1st Digit 2.1 0.0    Left Ulnar Anti Sensory (5th Digit)  32.8C  Wrist    3.4 <3.7 17.8 >15.0 Wrist 5th Digit 3.4 14.0 41 >38   Motor Summary Table   Stim Site NR Onset (ms) Norm Onset (ms) O-P Amp (mV) Norm O-P Amp Site1  Site2 Delta-0 (ms) Dist (cm) Vel (m/s) Norm Vel (m/s)  Left Median Motor (Abd Poll Brev)  32.5C  Wrist    *4.8 <4.2 6.1 >5 Elbow Wrist 5.0 23.5 *47 >50  Elbow    9.8  5.7         Left Ulnar Motor (Abd Dig Min)  32.7C  Wrist    2.9 <4.2 9.0 >3 B Elbow Wrist 3.6 23.5 65 >53  B Elbow    6.5  8.6  A Elbow B Elbow 1.3 9.5 73 >53  A Elbow    7.8  8.5          EMG   Side Muscle Nerve Root Ins Act Fibs Psw Amp Dur Poly Recrt Int Dennie Bible Comment  Left Abd Poll Brev Median C8-T1 *Incr Nml Nml Nml Nml 0 Nml Nml   Left 1stDorInt Ulnar C8-T1 Nml Nml Nml Nml Nml 0 Nml Nml   Left PronatorTeres Median C6-7 Nml Nml Nml Nml Nml 0 Nml Nml   Left Biceps Musculocut C5-6 Nml Nml Nml Nml Nml 0 Nml Nml   Left Deltoid Axillary C5-6 Nml Nml Nml Nml Nml 0 Nml Nml     Nerve Conduction Studies Anti Sensory Left/Right Comparison   Stim Site L Lat (ms) R Lat (ms) L-R Lat (ms) L Amp (V) R Amp (V) L-R Amp (%) Site1 Site2 L Vel (m/s) R Vel (m/s) L-R Vel (m/s)  Median Acr Palm Anti Sensory (2nd  Digit)  32.1C  Wrist *4.8   10.5   Wrist Palm     Palm *2.6   0.3         Radial Anti Sensory (Base 1st Digit)  32.5C  Wrist 2.1   22.3   Wrist Base 1st Digit     Ulnar Anti Sensory (5th Digit)  32.8C  Wrist 3.4   17.8   Wrist 5th Digit 41     Motor Left/Right Comparison   Stim Site L Lat (ms) R Lat (ms) L-R Lat (ms) L Amp (mV) R Amp (mV) L-R Amp (%) Site1 Site2 L Vel (m/s) R Vel (m/s) L-R Vel (m/s)  Median Motor (Abd Poll Brev)  32.5C  Wrist *4.8   6.1   Elbow Wrist *47    Elbow 9.8   5.7         Ulnar Motor (Abd Dig Min)  32.7C  Wrist 2.9   9.0   B Elbow Wrist 65    B Elbow 6.5   8.6   A Elbow B Elbow 73    A Elbow 7.8   8.5            Waveforms:

## 2017-11-08 NOTE — Progress Notes (Signed)
Chelsea Wheeler - 57 y.o. female MRN [id number]  Date of birth: [birth year]  Office Visit Note: Visit Date: 11/05/2017 PCP: Jerl Mina, MD Referred by: Jerl Mina, MD  Subjective: Chief Complaint  Patient presents with  . Left Hand - Pain, Tingling   HPI: Chelsea Wheeler  is a 57 year old right-hand-dominant female who comes in today at the request of Dr. Magnus Ivan for electrodiagnostic study of her left upper limb.  She first saw him in October of last year for her left hand.  He completed trigger finger injection at the time and ordered electrodiagnostic study of the left upper limb.  It appears looking to the referral notes that she was contacted but did not have any voicemail options and several attempts were made to call the patient for scheduling.  She recently called back in with worsening left hand pain and numbness and tingling and weakness.  She reports that the worst area of tingling and numbness is in the third fourth and fifth digit on the left hand.  She also states that there is pain in the thumb on the left hand.  She reports this is worsened over the last 6 weeks but obviously is been ongoing since October.  She says that driving and using left hand makes it worse.  She has been using a brace.  She reports that using a brace and running warm water over the hand actually makes it worse.  She gets a lot of sensitivity to the hand.  She reports 7 out of 10 pain.  She denies any right-sided complaints at all she denies any radicular pain.  She is asking for some type of pain medication until she can see Dr. Magnus Ivan.  She is allergic or intolerant to Tylenol 3 as well as etodolac and Naprosyn.  We did send a message to his staff.   ROS Otherwise per HPI.  Assessment & Plan: Visit Diagnoses:  1. Paresthesia of skin     Plan: No additional findings.  Impression: The above electrodiagnostic study is ABNORMAL and reveals evidence of a moderate left median nerve entrapment at the  wrist (carpal tunnel syndrome) affecting sensory and motor components.   There is no significant electrodiagnostic evidence of any other focal nerve entrapment, brachial plexopathy or cervical radiculopathy.    As you know, this particular electrodiagnostic study cannot rule out chemical radiculitis or sensory only radiculopathy.  Recommendations: 1.  Follow-up with referring physician. 2.  Continue current management of symptoms. 3.  Continue use of resting splint at night-time and as needed during the day. 4.  Suggest surgical evaluation.    Meds & Orders: No orders of the defined types were placed in this encounter.   Orders Placed This Encounter  Procedures  . NCV with EMG (electromyography)    Follow-up: Return for Dr. Magnus Ivan.   Procedures: No procedures performed  EMG & NCV Findings: Evaluation of the left median motor nerve showed prolonged distal onset latency (4.8 ms) and decreased conduction velocity (Elbow-Wrist, 47 m/s).  The left median (across palm) sensory nerve showed prolonged distal peak latency (Wrist, 4.8 ms) and prolonged distal peak latency (Palm, 2.6 ms).  All remaining nerves (as indicated in the following tables) were within normal limits.    Needle evaluation of the left abductor pollicis brevis muscle showed increased insertional activity.  All remaining muscles (as indicated in the following table) showed no evidence of electrical instability.    Impression: The above electrodiagnostic study is ABNORMAL and reveals evidence  of a moderate left median nerve entrapment at the wrist (carpal tunnel syndrome) affecting sensory and motor components.   There is no significant electrodiagnostic evidence of any other focal nerve entrapment, brachial plexopathy or cervical radiculopathy.    As you know, this particular electrodiagnostic study cannot rule out chemical radiculitis or sensory only radiculopathy.  Recommendations: 1.  Follow-up with referring  physician. 2.  Continue current management of symptoms. 3.  Continue use of resting splint at night-time and as needed during the day. 4.  Suggest surgical evaluation.  Nerve Conduction Studies Anti Sensory Summary Table   Stim Site NR Peak (ms) Norm Peak (ms) P-T Amp (V) Norm P-T Amp Site1 Site2 Delta-P (ms) Dist (cm) Vel (m/s) Norm Vel (m/s)  Left Median Acr Palm Anti Sensory (2nd Digit)  32.1C  Wrist    *4.8 <3.6 10.5 >10 Wrist Palm 2.2 0.0    Palm    *2.6 <2.0 0.3         Left Radial Anti Sensory (Base 1st Digit)  32.5C  Wrist    2.1 <3.1 22.3  Wrist Base 1st Digit 2.1 0.0    Left Ulnar Anti Sensory (5th Digit)  32.8C  Wrist    3.4 <3.7 17.8 >15.0 Wrist 5th Digit 3.4 14.0 41 >38   Motor Summary Table   Stim Site NR Onset (ms) Norm Onset (ms) O-P Amp (mV) Norm O-P Amp Site1 Site2 Delta-0 (ms) Dist (cm) Vel (m/s) Norm Vel (m/s)  Left Median Motor (Abd Poll Brev)  32.5C  Wrist    *4.8 <4.2 6.1 >5 Elbow Wrist 5.0 23.5 *47 >50  Elbow    9.8  5.7         Left Ulnar Motor (Abd Dig Min)  32.7C  Wrist    2.9 <4.2 9.0 >3 B Elbow Wrist 3.6 23.5 65 >53  B Elbow    6.5  8.6  A Elbow B Elbow 1.3 9.5 73 >53  A Elbow    7.8  8.5          EMG   Side Muscle Nerve Root Ins Act Fibs Psw Amp Dur Poly Recrt Int Dennie Bible Comment  Left Abd Poll Brev Median C8-T1 *Incr Nml Nml Nml Nml 0 Nml Nml   Left 1stDorInt Ulnar C8-T1 Nml Nml Nml Nml Nml 0 Nml Nml   Left PronatorTeres Median C6-7 Nml Nml Nml Nml Nml 0 Nml Nml   Left Biceps Musculocut C5-6 Nml Nml Nml Nml Nml 0 Nml Nml   Left Deltoid Axillary C5-6 Nml Nml Nml Nml Nml 0 Nml Nml     Nerve Conduction Studies Anti Sensory Left/Right Comparison   Stim Site L Lat (ms) R Lat (ms) L-R Lat (ms) L Amp (V) R Amp (V) L-R Amp (%) Site1 Site2 L Vel (m/s) R Vel (m/s) L-R Vel (m/s)  Median Acr Palm Anti Sensory (2nd Digit)  32.1C  Wrist *4.8   10.5   Wrist Palm     Palm *2.6   0.3         Radial Anti Sensory (Base 1st Digit)  32.5C  Wrist 2.1    22.3   Wrist Base 1st Digit     Ulnar Anti Sensory (5th Digit)  32.8C  Wrist 3.4   17.8   Wrist 5th Digit 41     Motor Left/Right Comparison   Stim Site L Lat (ms) R Lat (ms) L-R Lat (ms) L Amp (mV) R Amp (mV) L-R Amp (%) Site1 Site2 L Vel (m/s)  R Vel (m/s) L-R Vel (m/s)  Median Motor (Abd Poll Brev)  32.5C  Wrist *4.8   6.1   Elbow Wrist *47    Elbow 9.8   5.7         Ulnar Motor (Abd Dig Min)  32.7C  Wrist 2.9   9.0   B Elbow Wrist 65    B Elbow 6.5   8.6   A Elbow B Elbow 73    A Elbow 7.8   8.5            Waveforms:            Clinical History: No specialty comments available.   She reports that she has never smoked. She has never used smokeless tobacco. No results for input(s): HGBA1C, LABURIC in the last 8760 hours.  Objective:  VS:  HT:    WT:   BMI:     BP:   HR: bpm  TEMP: ( )  RESP:  Physical Exam  Musculoskeletal:  Inspection reveals no atrophy of the bilateral APB or FDI or hand intrinsics. There is no swelling, color changes, allodynia or dystrophic changes. There is 5 out of 5 strength in the bilateral wrist extension, finger abduction and long finger flexion.  There is impaired sensation chiefly to the middle and ring finger on the left and compared to right.  She actually does have some increased sensitization on the radial side of the fourth digit compared to the ulnar side.   There is a negative Hoffmann's test bilaterally.    Ortho Exam Imaging: No results found.  Past Medical/Family/Surgical/Social History: Medications & Allergies reviewed per EMR, new medications updated. Patient Active Problem List   Diagnosis Date Noted  . Unilateral primary osteoarthritis, left knee 10/14/2016  . Osteoarthritis of right knee 09/06/2015  . Status post total right knee replacement 09/06/2015  . Cough 09/14/2012   Past Medical History:  Diagnosis Date  . Arthritis    knee  . Claustrophobia   . GERD (gastroesophageal reflux disease)   . Hypertension     . Seasonal allergies    Family History  Problem Relation Age of Onset  . Allergies Sister    Past Surgical History:  Procedure Laterality Date  . ENDOMETRIAL ABLATION  2007  . KNEE ARTHROSCOPY Right 03/22/2015   Procedure: ARTHROSCOPY KNEE, partial lateral meniscectomy, microfracture, chondraplasty;  Surgeon: Erin Sons, MD;  Location: [facility];  Service: Orthopedics;  Laterality: Right;  . TOTAL KNEE ARTHROPLASTY Right 09/06/2015   Procedure: RIGHT TOTAL KNEE ARTHROPLASTY;  Surgeon: Kathryne Hitch, MD;  Location: [facility];  Service: Orthopedics;  Laterality: Right;  . TUBAL LIGATION  08-31-06   Social History   Occupational History  . Occupation: Midwife  Tobacco Use  . Smoking status: Never Smoker  . Smokeless tobacco: Never Used  Substance and Sexual Activity  . Alcohol use: No  . Drug use: No  . Sexual activity: Not on file

## 2017-11-11 NOTE — Telephone Encounter (Signed)
Patient needs a work note for her part time job which is at [employer].  She is needing this note stating that she is not able to work for a specific time and wanted to know if the note could be faxed to [phone].  Thank you.

## 2017-11-11 NOTE — Telephone Encounter (Signed)
Patient aware of the below message  

## 2017-11-11 NOTE — Telephone Encounter (Signed)
I guess we need to see her before we can do a note since we haven't seen her in a while

## 2017-11-11 NOTE — Telephone Encounter (Signed)
Called patient, no answer and mailbox is full If she calls back please get more detailed message

## 2017-11-11 NOTE — Telephone Encounter (Signed)
See below, is this ok? We haven't really seen her in awhile

## 2017-11-23 ENCOUNTER — Encounter (INDEPENDENT_AMBULATORY_CARE_PROVIDER_SITE_OTHER): Payer: Self-pay | Admitting: Orthopaedic Surgery

## 2017-11-23 ENCOUNTER — Ambulatory Visit (INDEPENDENT_AMBULATORY_CARE_PROVIDER_SITE_OTHER): Payer: BC Managed Care – PPO | Admitting: Orthopaedic Surgery

## 2017-11-23 DIAGNOSIS — G5602 Carpal tunnel syndrome, left upper limb: Secondary | ICD-10-CM

## 2017-11-23 MED ORDER — LIDOCAINE HCL 1 % IJ SOLN
1.0000 mL | INTRAMUSCULAR | Status: AC | PRN
  Administered 2017-11-23: 1 mL

## 2017-11-23 MED ORDER — DICLOFENAC SODIUM 1 % TD GEL: 2.0000 g | Freq: Four times a day (QID) | TRANSDERMAL | 3 refills | Status: DC

## 2017-11-23 MED ORDER — METHYLPREDNISOLONE ACETATE 40 MG/ML IJ SUSP
40.0000 mg | INTRAMUSCULAR | Status: AC | PRN
  Administered 2017-11-23: 40 mg

## 2017-11-23 NOTE — Progress Notes (Signed)
Office Visit Note   Patient: Chelsea Wheeler           Date of Birth: Nov 23, 1960           MRN: [id number] Visit Date: 11/23/2017              Requested by: Jerl Mina, MD [address] [facility] [place], [place] [address] PCP: Jerl Mina, MD   Assessment & Plan: Visit Diagnoses:  1. Carpal tunnel syndrome, left upper limb     Plan: She did wish to try a steroid injection in the transverse carpal ligament area of the left side.  She understands the risks and benefits to these types of injections and tolerated it well.  From a work standpoint I do feel she needs work restrictions from [employer].  Would like her to not lift anything greater than 5 to 10 pounds with her hands bilaterally for the foreseeable future.  This needs to be up until and following any type of surgery that she has in the future for carpal tunnel.  I would like to see her back in about 6 weeks to see how she is doing overall.  Follow-Up Instructions: Return in about 6 weeks (around 01/04/2018).   Orders:  No orders of the defined types were placed in this encounter.  Meds ordered this encounter  Medications  . diclofenac sodium (VOLTAREN) 1 % GEL    Sig: Apply 2 g topically 4 (four) times daily.    Dispense:  100 g    Refill:  3      Procedures: Hand/UE Inj: L carpal tunnel for carpal tunnel syndrome on 11/23/2017 10:46 AM Medications: 1 mL lidocaine 1 %; 40 mg methylPREDNISolone acetate 40 MG/ML      Clinical Data: No additional findings.   Subjective: Chief Complaint  Patient presents with  . Left Hand - Follow-up  The patient is well-known to me.  She is following up after having nerve conduction studies on the left upper extremity.  She also has triggering of her right thumb and has been bothering her as well.  She has a history of a right knee replacement.  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  She does wear a wrist splint on her left wrist.  HPI  Review of Systems She currently denies any headache, chest pain, shortness of breath, fever, chills, nausea, vomiting.  Objective: Vital Signs: There were no vitals taken for this visit.  Physical Exam She is alert and oriented x3 in no acute distress Ortho Exam Examination of her left hand does show numbness in the median nerve distribution with a weak grip and pinch strength and a positive Phalen's and Tinel sign.  Examination of her right hand does show pain over the thumb A1 pulley but no triggering today. Specialty Comments:  No specialty comments available. The nerve conduction studies were reviewed with her and it does show moderate carpal tunnel syndrome of the left upper extremity. Imaging: No results found.   PMFS History: Patient Active Problem List   Diagnosis Date Noted  . Carpal tunnel syndrome, left upper limb 11/23/2017  . Unilateral primary osteoarthritis, left knee 10/14/2016  . Osteoarthritis of right knee 09/06/2015  . Status post total right knee replacement  09/06/2015  . Cough 09/14/2012   Past Medical History:  Diagnosis Date  . Arthritis    knee  . Claustrophobia   . GERD (gastroesophageal reflux disease)   . Hypertension   . Seasonal allergies     Family History  Problem Relation Age of Onset  . Allergies Sister     Past Surgical History:  Procedure Laterality Date  . ENDOMETRIAL ABLATION  2007  . KNEE ARTHROSCOPY Right 03/22/2015   Procedure: ARTHROSCOPY KNEE, partial lateral meniscectomy, microfracture, chondraplasty;  Surgeon: Erin Sons, MD;  Location: [facility] ORS;  Service: Orthopedics;  Laterality: Right;  . TOTAL KNEE ARTHROPLASTY Right 09/06/2015   Procedure: RIGHT TOTAL KNEE ARTHROPLASTY;  Surgeon: Kathryne Hitch, MD;  Location: [facility];  Service:  Orthopedics;  Laterality: Right;  . TUBAL LIGATION  08-31-06   Social History   Occupational History  . Occupation: Midwife  Tobacco Use  . Smoking status: Never Smoker  . Smokeless tobacco: Never Used  Substance and Sexual Activity  . Alcohol use: No  . Drug use: No  . Sexual activity: Not on file

## 2017-12-08 ENCOUNTER — Telehealth (INDEPENDENT_AMBULATORY_CARE_PROVIDER_SITE_OTHER): Payer: Self-pay | Admitting: Orthopaedic Surgery

## 2017-12-08 NOTE — Telephone Encounter (Signed)
See below, patient wanted me to ask tell you the below, what should she do? Shot didn't help

## 2017-12-08 NOTE — Telephone Encounter (Signed)
SEE DR. BLACKMAN NEXT WEEK

## 2017-12-08 NOTE — Telephone Encounter (Signed)
Please advise 

## 2017-12-08 NOTE — Telephone Encounter (Signed)
Patient called wanting to know what type of injection did she receive the last time, she stated that her hand is hurting worse than before, especially the left hand. She wants to know what else can be done for her hands? CB#[phone].  Thank you.

## 2017-12-13 ENCOUNTER — Telehealth (INDEPENDENT_AMBULATORY_CARE_PROVIDER_SITE_OTHER): Payer: Self-pay | Admitting: Orthopaedic Surgery

## 2017-12-13 ENCOUNTER — Ambulatory Visit (INDEPENDENT_AMBULATORY_CARE_PROVIDER_SITE_OTHER): Payer: BC Managed Care – PPO | Admitting: Orthopaedic Surgery

## 2017-12-13 NOTE — Telephone Encounter (Signed)
Patient called wanting to speak with you about the last injection she received in her hand, she states it didn't help with the pain and she would like to ask you some questions. CB # [phone]

## 2017-12-15 NOTE — Telephone Encounter (Signed)
There is nothing else she can do other than a carpal tunnel splint or considering to have carpal tunnel surgery, which we can set up for her as an outpatient.  The surgery itself only takes about 20 minutes and she would have sutures in her hand that we would remove in two weeks.  We would need to keep her from lifting anything with that hand greater than 5 lbs for 4-6 weeks.

## 2017-12-15 NOTE — Telephone Encounter (Signed)
Patient aware of the below message  

## 2017-12-15 NOTE — Telephone Encounter (Signed)
States the injection didn't help she stated that her hand is hurting worse than before, especially the left hand. She wants to know what else can be done for her hands?

## 2017-12-27 ENCOUNTER — Telehealth (INDEPENDENT_AMBULATORY_CARE_PROVIDER_SITE_OTHER): Payer: Self-pay | Admitting: Orthopaedic Surgery

## 2017-12-27 ENCOUNTER — Telehealth: Payer: Self-pay | Admitting: Podiatry

## 2017-12-27 NOTE — Telephone Encounter (Signed)
Pt is calling about a surgery that was preformed by Dr. Samuella CotaPrice on 04/22/2017. She is having pain and she needs some advice. Please give her a call.

## 2017-12-27 NOTE — Telephone Encounter (Signed)
Patient called asked if she can get a call back concerning canceling her appointment for 07/08/196. [facility] reschedule the appointment. The number to contact patient is [phone]

## 2017-12-27 NOTE — Telephone Encounter (Signed)
Called pt - said she was having some pain at the toe and the space in between. Informed pt that she never followed up for POV appts. She said she couldn't afford to. She mentioned the space in between looked moist sometimes. She said she still had leftover betadine. I advised she could dab some betadine between the toes and use a toe spacer to keep toes separated. She also scheduled an appt to follow up with Dr. Samuella CotaPrice as well.

## 2017-12-28 NOTE — Telephone Encounter (Signed)
Patient wondering if forms were completed, would like a callback to let her know

## 2017-12-28 NOTE — Telephone Encounter (Signed)
I do not have anything on this patient.

## 2017-12-29 NOTE — Telephone Encounter (Signed)
Will you do me a HUGE favor call her and tell her I didn't find any paper work from her, to give us more to fill out when she actually schedules the surgery

## 2018-01-03 ENCOUNTER — Ambulatory Visit (INDEPENDENT_AMBULATORY_CARE_PROVIDER_SITE_OTHER): Payer: BC Managed Care – PPO | Admitting: Orthopaedic Surgery

## 2018-01-13 ENCOUNTER — Ambulatory Visit: Payer: BC Managed Care – PPO | Admitting: Podiatry

## 2018-06-24 DIAGNOSIS — M810 Age-related osteoporosis without current pathological fracture: Secondary | ICD-10-CM | POA: Insufficient documentation

## 2018-08-25 DIAGNOSIS — M65312 Trigger thumb, left thumb: Secondary | ICD-10-CM | POA: Insufficient documentation

## 2018-11-22 ENCOUNTER — Telehealth: Payer: Self-pay | Admitting: Podiatry

## 2018-11-22 NOTE — Telephone Encounter (Signed)
I called pt she states Dr Samuella Cota did a procedure on the left pinky toe, and she states she still has some moisture there, it kind of never got better and the foam really didn't help. I told pt that even if the toe shape was corrected, if other environs didn't change she may still have the moist area, I instructed pt to wear white cotton sock, wash in bleach, wear loose fitting shoes, dry between the toes and allow to also air dry before putting on shoes. Pt states she is doing all of that, and wanted to know if there was something other than the foam to use. I told pt I would ask Dr. Samuella Cota and call again. Pt states she can't afford the co-pay to come in.

## 2018-11-22 NOTE — Telephone Encounter (Signed)
Pt has some questions about the sx that Dr. Samuella Cota previously did. Please call patient

## 2018-11-24 ENCOUNTER — Ambulatory Visit: Payer: BC Managed Care – PPO | Admitting: Podiatry

## 2018-12-02 NOTE — Telephone Encounter (Signed)
Pt has not been seen in office since 04/2017 and Dr. Samuella Cota would like her to make an appt. Pt states understanding and I gave her the scheduler [phone].

## 2018-12-02 NOTE — Telephone Encounter (Signed)
Pt states she was suppose to get a call about a medication.

## 2018-12-26 DIAGNOSIS — I10 Essential (primary) hypertension: Secondary | ICD-10-CM | POA: Insufficient documentation

## 2019-01-18 ENCOUNTER — Ambulatory Visit (INDEPENDENT_AMBULATORY_CARE_PROVIDER_SITE_OTHER): Payer: BC Managed Care – PPO | Admitting: Orthopaedic Surgery

## 2019-01-18 ENCOUNTER — Encounter: Payer: Self-pay | Admitting: Orthopaedic Surgery

## 2019-01-18 ENCOUNTER — Ambulatory Visit: Payer: Self-pay

## 2019-01-18 ENCOUNTER — Other Ambulatory Visit: Payer: Self-pay

## 2019-01-18 VITALS — Ht 69.0 in | Wt 191.0 lb

## 2019-01-18 DIAGNOSIS — Z96651 Presence of right artificial knee joint: Secondary | ICD-10-CM | POA: Diagnosis not present

## 2019-01-18 DIAGNOSIS — G8929 Other chronic pain: Secondary | ICD-10-CM

## 2019-01-18 DIAGNOSIS — M25561 Pain in right knee: Secondary | ICD-10-CM

## 2019-01-18 NOTE — Progress Notes (Signed)
Office Visit Note   Patient: Chelsea Wheeler           Date of Birth: 11/26/1960           MRN: [id number] Visit Date: 01/18/2019              Requested by: Jerl MinaHedrick, James, MD [address] [facility] [place] [id number] PCP: Jerl MinaHedrick, James, MD   Assessment & Plan: Visit Diagnoses:  1. Chronic pain of right knee   2. History of total right knee replacement     Plan: I did review her operative note and see that she does have a 9 mm thickness insert.  At the time of surgery we felt that was stable for her.  We were concerned about the potential for postoperative pain and scar tissue and this is felt appropriate based on what worsening on even postoperative x-rays today.  However, her knee does feel loose to me ligamentously and I feel that she would benefit from upsizing of her polyliner.  I used a knee model and tried explained to her as best I could describing what is going on with her knee and what I do feel would be helpful for her in order to give her a more stable feeling knee.  I described in detail what the surgery involves with a polyliner exchange.  I do feel comfortable and confident that the femoral and tibial components are intact and not loose and look good.  I told her about what this would involve intraoperatively and postoperatively in terms of recovery.  All question concerns were answered addressed.  She will let us know if she would like to consider having this done.  I do feel that it is necessary based on her exam findings and her signs and symptoms.  Follow-Up Instructions: Return if symptoms worsen or fail to improve.   Orders:  Orders Placed This Encounter  Procedures  . XR Knee 1-2 Views Right   No orders of the defined types were placed in this encounter.     Procedures: No procedures performed   Clinical Data: No additional findings.   Subjective: Chief Complaint  Patient presents with  . Right Knee - Pain  The patient is a  58 year old female who is just over 3 years out from a right total knee arthroplasty.  She says her right knee has been hurting her for several months now.  This was the knee that we replaced.  She also has had some issues of instability like the knee is giving way on her.  She points to below the patella and the lateral aspect of his knee up to above the patella as a source of her pain.  She says she has a sleep with a pillow between her legs.  She has tried Tylenol and ibuprofen and ice.  She does wear a knee sleeve as well.  She is not happy with how the knee has been for  HPI  Review of Systems She currently denies any headache, chest pain, shortness of breath, fever, chills, nausea, vomiting  Objective: Vital Signs: Ht 5\' 9"  (1.753 m)   Wt 191 lb (86.6 kg)   BMI 28.21 kg/m   Physical Exam She is alert and orient x3 and in no acute distress Ortho Exam Examination of her right knee shows no effusion.  She is a well-healed surgical incision.  She has excellent range of motion of that knee.  She does feel to me  loose with varus and valgus stressing and a little bit more play on her drawer sign. Specialty Comments:  No specialty comments available.  Imaging: Xr Knee 1-2 Views Right  Result Date: 01/18/2019 An AP and lateral of the right knee shows a well-seated total knee arthroplasty with no complicating features.  There is no malalignment.  There is no evidence of loosening of the components.  There is no knee joint effusion.    PMFS History: Patient Active Problem List   Diagnosis Date Noted  . Carpal tunnel syndrome, left upper limb 11/23/2017  . Unilateral primary osteoarthritis, left knee 10/14/2016  . Osteoarthritis of right knee 09/06/2015  . Status post total right knee replacement 09/06/2015  . Cough 09/14/2012   Past Medical History:  Diagnosis Date  . Arthritis    knee  . Claustrophobia   . GERD (gastroesophageal reflux disease)   . Hypertension   . Seasonal  allergies     Family History  Problem Relation Age of Onset  . Allergies Sister     Past Surgical History:  Procedure Laterality Date  . ENDOMETRIAL ABLATION  2007  . KNEE ARTHROSCOPY Right 03/22/2015   Procedure: ARTHROSCOPY KNEE, partial lateral meniscectomy, microfracture, chondraplasty;  Surgeon: Leanor Kail, MD;  Location: [facility] ORS;  Service: Orthopedics;  Laterality: Right;  . TOTAL KNEE ARTHROPLASTY Right 09/06/2015   Procedure: RIGHT TOTAL KNEE ARTHROPLASTY;  Surgeon: Mcarthur Rossetti, MD;  Location: [facility];  Service: Orthopedics;  Laterality: Right;  . TUBAL LIGATION  08-31-06   Social History   Occupational History  . Occupation: Recruitment consultant  Tobacco Use  . Smoking status: Never Smoker  . Smokeless tobacco: Never Used  Substance and Sexual Activity  . Alcohol use: No  . Drug use: No  . Sexual activity: Not on file

## 2019-01-25 ENCOUNTER — Telehealth: Payer: Self-pay

## 2019-01-25 NOTE — Telephone Encounter (Signed)
Patient left voice mail to inquire about scheduling total knee poly exchange.  I called her back to discuss.  She is going to have CTR on 02/10/19 because that is bothering her more now.  She will call me a few weeks after that when she feels like she is ready to schedule knee surgery.

## 2019-02-01 ENCOUNTER — Telehealth: Payer: Self-pay | Admitting: Orthopaedic Surgery

## 2019-02-01 ENCOUNTER — Other Ambulatory Visit: Payer: Self-pay | Admitting: Physician Assistant

## 2019-02-01 MED ORDER — HYDROCODONE-ACETAMINOPHEN 5-325 MG PO TABS: 1.0000 | ORAL_TABLET | Freq: Four times a day (QID) | ORAL | 0 refills | Status: DC | PRN

## 2019-02-01 NOTE — Telephone Encounter (Signed)
Patient called advised the Tramadol and Meloxicam is not working. Patient asked if she can get something stronger? The number to contact patient is [phone]

## 2019-02-01 NOTE — Telephone Encounter (Signed)
Please advise 

## 2019-02-01 NOTE — Telephone Encounter (Signed)
Done said she tolerates Norco

## 2019-02-03 ENCOUNTER — Telehealth: Payer: Self-pay | Admitting: Physician Assistant

## 2019-02-03 NOTE — Telephone Encounter (Signed)
Patient called advised the Rx for Norco is wrong and she could not pick up her pain medication. Patient said the pharmacy said the doctor would have to change the Rx before she can get the medication. The number to contact patient is [phone]

## 2019-02-03 NOTE — Telephone Encounter (Signed)
Can you please advise? Script has 2 different directions on it.

## 2019-02-03 NOTE — Telephone Encounter (Signed)
FYI Sent to [facility] to advise

## 2019-02-06 NOTE — Telephone Encounter (Signed)
thanks

## 2019-02-06 NOTE — Telephone Encounter (Signed)
The pharmacy just needs clarification on what sig you want for rx. I already looked into it on Friday. They are just needing clarification from you. I tried calling them myself to advise but they stated you had to call

## 2019-02-06 NOTE — Telephone Encounter (Signed)
Can you check on this? thanks 

## 2019-02-06 NOTE — Telephone Encounter (Signed)
Called and left message that the first set of directions are correct

## 2019-03-20 ENCOUNTER — Telehealth: Payer: Self-pay | Admitting: Orthopaedic Surgery

## 2019-03-20 NOTE — Telephone Encounter (Signed)
IC patient  She states that surgery was discussed for new liner? States that her knee has gotten much more painful. Wanted to know if you could call her to further discuss or ROV? She said she is concerned about insurance not covering. States she cant do much of anything anymore.

## 2019-03-20 NOTE — Telephone Encounter (Signed)
Patient called. Says she is still having trouble with her knee. Her call back number is [phone]

## 2019-03-20 NOTE — Telephone Encounter (Signed)
appt scheduled

## 2019-03-20 NOTE — Telephone Encounter (Signed)
I do need to see her in the office to go over things with her.  I am recommending a polyliner exchange for her total knee.  I saw her in July and recommended this.  I do not feel that this will be an issue with her insurance based on what worsening clinically and what our recommendations are for her knee such as this.  With that being said, since things are worsening for her I need to see her in the office.  We can then schedule surgery for her.

## 2019-04-03 ENCOUNTER — Encounter: Payer: Self-pay | Admitting: Orthopaedic Surgery

## 2019-04-03 ENCOUNTER — Ambulatory Visit (INDEPENDENT_AMBULATORY_CARE_PROVIDER_SITE_OTHER): Payer: BC Managed Care – PPO | Admitting: Orthopaedic Surgery

## 2019-04-03 DIAGNOSIS — T84068A Wear of articular bearing surface of other internal prosthetic joint, initial encounter: Secondary | ICD-10-CM | POA: Insufficient documentation

## 2019-04-03 DIAGNOSIS — T8484XD Pain due to internal orthopedic prosthetic devices, implants and grafts, subsequent encounter: Secondary | ICD-10-CM

## 2019-04-03 DIAGNOSIS — T84068D Wear of articular bearing surface of other internal prosthetic joint, subsequent encounter: Secondary | ICD-10-CM

## 2019-04-03 DIAGNOSIS — Z96659 Presence of unspecified artificial knee joint: Secondary | ICD-10-CM | POA: Diagnosis not present

## 2019-04-03 DIAGNOSIS — Z96651 Presence of right artificial knee joint: Secondary | ICD-10-CM

## 2019-04-03 DIAGNOSIS — T8484XA Pain due to internal orthopedic prosthetic devices, implants and grafts, initial encounter: Secondary | ICD-10-CM | POA: Insufficient documentation

## 2019-04-03 NOTE — Progress Notes (Signed)
The patient is here again today to discuss her right total knee arthroplasty and her feeling unstable with that right knee.  I saw her in July of this year and on exam felt like the knee had some instability.  At that time I did recommend a polyliner exchange.  I was able to review my operative note and she has a 9 mm thickness polyethylene liner.  I do feel that upsizing her liner would give her more stability with the knee.  She still having symptoms that the knee feels weak and gives way on her.  She says is worse going up and down stairs.  We performed her original right knee arthroplasty in March 2017.  On examination her right knee is slightly hyperextends and there is definitely some lady with varus and valgus stressing.  X-rays earlier this year show well-seated implants with no evidence of loosening or failure.  Her calf is soft and she has good quad strength and no atrophy.  She has good mobility of her lower extremity in general and excellent range of motion.  Given the mechanical instability of her knee I do feel that she needs upsizing of her polyliner.  I showed her a knee model with the knee replacement and explained what is going on with her knee and how I do feel that surgery could help give her more stable knee.  We talked about the risk and benefits of surgery.  We talked about her interoperative and postoperative course and what to expect.  All question concerns were answered and addressed.  I would like to get her set up for surgery sometime in the next few weeks to perform a polyliner upsizing for instability.  We would then see her back in 2 weeks postoperative.

## 2019-04-13 ENCOUNTER — Encounter (HOSPITAL_COMMUNITY): Admission: RE | Admit: 2019-04-13 | Payer: BC Managed Care – PPO | Source: Ambulatory Visit

## 2019-04-18 ENCOUNTER — Other Ambulatory Visit: Payer: Self-pay

## 2019-05-08 ENCOUNTER — Ambulatory Visit: Payer: BC Managed Care – PPO | Admitting: Orthopaedic Surgery

## 2019-05-23 ENCOUNTER — Other Ambulatory Visit: Payer: Self-pay | Admitting: Orthopaedic Surgery

## 2019-05-23 ENCOUNTER — Other Ambulatory Visit (HOSPITAL_COMMUNITY): Payer: BC Managed Care – PPO

## 2019-06-02 ENCOUNTER — Telehealth: Payer: Self-pay | Admitting: Orthopaedic Surgery

## 2019-06-02 NOTE — Telephone Encounter (Signed)
Patient called and asked for call back from [facility]. Patient number is  [phone]

## 2019-06-02 NOTE — Telephone Encounter (Signed)
I called patient.  She wants to postpone surgery for now.  There are a lot of people out of work.  She is very uncomfortable proceeding, with a rise in [place] numbers.  She has not been granted time off from work either.  She will call me back when ready to reschedule and she is more comfortable in doing so.

## 2019-06-08 ENCOUNTER — Ambulatory Visit: Payer: BC Managed Care – PPO | Admitting: Orthopaedic Surgery

## 2019-06-15 ENCOUNTER — Other Ambulatory Visit: Payer: Self-pay

## 2019-06-15 ENCOUNTER — Ambulatory Visit (INDEPENDENT_AMBULATORY_CARE_PROVIDER_SITE_OTHER): Payer: BC Managed Care – PPO

## 2019-06-15 ENCOUNTER — Ambulatory Visit: Payer: BC Managed Care – PPO | Admitting: Podiatry

## 2019-06-15 DIAGNOSIS — M205X2 Other deformities of toe(s) (acquired), left foot: Secondary | ICD-10-CM

## 2019-06-15 DIAGNOSIS — M7752 Other enthesopathy of left foot: Secondary | ICD-10-CM

## 2019-06-15 DIAGNOSIS — M2042 Other hammer toe(s) (acquired), left foot: Secondary | ICD-10-CM

## 2019-06-15 DIAGNOSIS — M79675 Pain in left toe(s): Secondary | ICD-10-CM

## 2019-06-15 DIAGNOSIS — M79672 Pain in left foot: Secondary | ICD-10-CM

## 2019-06-16 ENCOUNTER — Telehealth: Payer: Self-pay | Admitting: *Deleted

## 2019-06-16 ENCOUNTER — Other Ambulatory Visit (HOSPITAL_COMMUNITY): Payer: BC Managed Care – PPO

## 2019-06-16 NOTE — Telephone Encounter (Signed)
DOS 06/29/2019 HAMMER TOE REPAIR 4,5 LEFT FOOT - 53299  BCBS: Eligibility Date -  06/29/2018 - 06/28/9998    In-Network    Max Per Benefit Period Year-to-Date Remaining  CoInsurance  20%    Deductible  $1250.00 $0.00  Out-Of-Pocket  $4890.00 $2462.49   SURGERY  In Network  [employer] Coinsurance  Not Applicable  24% per SERVICE YEAR     SPECIALISTS OFFICE VISIT  In Network  [employer] Coinsurance  $80.00 per VISITS  Not Applicable

## 2019-06-20 ENCOUNTER — Inpatient Hospital Stay: Admit: 2019-06-20 | Payer: BC Managed Care – PPO | Admitting: Orthopaedic Surgery

## 2019-06-20 SURGERY — TOTAL KNEE REVISION
Anesthesia: Choice | Site: Knee | Laterality: Right

## 2019-06-21 ENCOUNTER — Other Ambulatory Visit: Payer: Self-pay | Admitting: Podiatry

## 2019-06-21 DIAGNOSIS — M2042 Other hammer toe(s) (acquired), left foot: Secondary | ICD-10-CM

## 2019-06-29 ENCOUNTER — Encounter: Payer: Self-pay | Admitting: Podiatry

## 2019-06-29 ENCOUNTER — Ambulatory Visit (INDEPENDENT_AMBULATORY_CARE_PROVIDER_SITE_OTHER): Payer: BC Managed Care – PPO | Admitting: Podiatry

## 2019-06-29 ENCOUNTER — Other Ambulatory Visit: Payer: Self-pay

## 2019-06-29 ENCOUNTER — Ambulatory Visit (INDEPENDENT_AMBULATORY_CARE_PROVIDER_SITE_OTHER): Payer: BC Managed Care – PPO

## 2019-06-29 VITALS — BP 123/81 | HR 83 | Resp 20

## 2019-06-29 DIAGNOSIS — M2042 Other hammer toe(s) (acquired), left foot: Secondary | ICD-10-CM

## 2019-06-29 DIAGNOSIS — M79672 Pain in left foot: Secondary | ICD-10-CM | POA: Diagnosis not present

## 2019-06-29 HISTORY — PX: CORRECTION HAMMER TOE: SUR317

## 2019-06-29 MED ORDER — OXYCODONE-ACETAMINOPHEN 5-325 MG PO TABS: 1.0000 | ORAL_TABLET | ORAL | 0 refills | Status: DC | PRN

## 2019-06-29 MED ORDER — HYDROCODONE-ACETAMINOPHEN 5-325 MG PO TABS: 1.0000 | ORAL_TABLET | Freq: Four times a day (QID) | ORAL | 0 refills | Status: DC | PRN

## 2019-06-29 MED ORDER — DOXYCYCLINE HYCLATE 100 MG PO TABS: 100.0000 mg | ORAL_TABLET | Freq: Two times a day (BID) | ORAL | 0 refills | Status: DC

## 2019-06-29 MED ORDER — CLINDAMYCIN HCL 150 MG PO CAPS: 150.0000 mg | ORAL_CAPSULE | Freq: Two times a day (BID) | ORAL | 0 refills | Status: DC

## 2019-06-29 MED ORDER — HYDROCODONE-ACETAMINOPHEN 5-325 MG PO TABS: 1.0000 | ORAL_TABLET | ORAL | 0 refills | Status: DC | PRN

## 2019-06-29 NOTE — Progress Notes (Signed)
Patient Name: Chelsea Wheeler DOB: [birth year]  MRN: [id number]   Date of Service: 06/29/2019 Surgeon: Dr. Hardie Pulley, DPM Assistants: None Pre-operative Diagnosis:  Hammertoe Post-operative Diagnosis:  same Procedures:  1) Correction hammertoe left 4th toe  2) Correction hammertoe left 5th toe Pathology/Specimens: * Cannot find log * Anesthesia: Local Hemostasis: Pneumatic ankle tourniquet 21 mins Estimated Blood Loss: * No surgery found * Materials: * No surgical log found * Medications: None Complications: none  Indications for Procedure:  This is a 58 y.o. female with hammertoes to the left 4th and 5th toes for elective correction after failing conservative therapy.     Procedure in Detail: Patient was brought back to the exam room. Anesthesia was induced. Formal consent was reviewed. and the left lower extremity was marked. Patient was brought back to the procedure room. The extremity was prepped and draped in the usual sterile fashion. Timeout was taken to confirm patient name, laterality, and procedure prior to incision.   Attention was then directed to the left 4th toe.  Incision was made at the dorsal to the digit centered over the interphalangeal joint dissection was carried down to the level of the extensor tendon.  The extensor tendon was transected distal to the proximal phalangeal joint.  The tendon was freed off the proximal phalanx and the phalangeal head was exposed.  The proximal phalangeal head was excised with a sagittal saw. Hammertoe reduction was noted.  Attention was then directed to the left 5th toe.  Two elliptical incisions were made at the dorsal aspect of the digit centered over the interphalangeal joint; dissection was carried down to the level of the extensor tendon.  The extensor tendon was transected distal to the proximal phalangeal joint.  The tendon was freed off the proximal phalanx and the phalangeal head was exposed.  The proximal phalangeal  head was excised with a sagittal saw. Hammertoe reduction was noted.  Each wound was copiously irrigated. The extensor complexes were repaired with 4-0 vicryl. The skin was repaired with running 4-0 nylon.  The foot was then dressed with xerpform, 4x4. Kerlix, ACE bandage. Patient tolerated the procedure well.

## 2019-07-02 NOTE — Progress Notes (Signed)
Subjective:  Patient ID: Chelsea Wheeler, female    DOB: [birth year],  MRN: [id number]  Chief Complaint  Patient presents with  . Callouses    pt is here for a possible callouses, between the 4th and 5th toes, pt states that the pain has been going on for about 2-3 months, pt states that pain is elevated when wearing different types of shoes, but not much is helping    59 y.o. female presents with the above complaint.  History above confirmed with patient   Review of Systems: Negative except as noted in the HPI. Denies N/V/F/Ch.  Past Medical History:  Diagnosis Date  . Arthritis    knee  . Claustrophobia   . GERD (gastroesophageal reflux disease)   . Hypertension   . Seasonal allergies     Current Outpatient Medications:  .  albuterol (VENTOLIN HFA) 108 (90 Base) MCG/ACT inhaler, INHALE 2 PUFFS BY MOUTH EVERY 6 HOURS AS NEEDED FOR WHEEZING, Disp: , Rfl:  .  cetirizine (ZYRTEC) 10 MG tablet, Take 10 mg by mouth daily. , Disp: , Rfl:  .  Cholecalciferol (VITAMIN D3 PO), Take 1 tablet by mouth daily. , Disp: , Rfl:  .  diclofenac sodium (VOLTAREN) 1 % GEL, Apply 2 g topically 4 (four) times daily., Disp: 100 g, Rfl: 3 .  esomeprazole (NEXIUM) 40 MG capsule, Take 40 mg by mouth every morning. , Disp: , Rfl:  .  fluticasone (FLONASE) 50 MCG/ACT nasal spray, Place 1 spray into both nostrils daily as needed for allergies or rhinitis. , Disp: , Rfl:  .  hydrochlorothiazide (HYDRODIURIL) 25 MG tablet, Take 25 mg by mouth every morning. , Disp: , Rfl:  .  HYDROcodone-acetaminophen (NORCO) 5-325 MG tablet, Take 1 tablet by mouth every 6 (six) hours as needed for moderate pain. One to two tabs every 4-6 hours for pain, Disp: 30 tablet, Rfl: 0 .  Ketoconazole 2 % FOAM, Apply fingertip amount to space between toes, Disp: 50 g, Rfl: 0 .  KLOR-CON M20 20 MEQ tablet, , Disp: , Rfl:  .  losartan (COZAAR) 50 MG tablet, Take 100 mg by mouth daily., Disp: , Rfl:  .  montelukast (SINGULAIR) 10 MG  tablet, , Disp: , Rfl:  .  Multiple Vitamins-Minerals (ANTIOXIDANT FORMULA SG) capsule, Take 1 capsule by mouth daily., Disp: , Rfl:  .  potassium chloride (K-DUR) 10 MEQ tablet, Take 10 mEq by mouth 2 (two) times daily., Disp: , Rfl:  .  traMADol (ULTRAM) 50 MG tablet, Take 1 tablet (50 mg total) by mouth 3 (three) times daily as needed., Disp: 40 tablet, Rfl: 0 .  zolpidem (AMBIEN CR) 12.5 MG CR tablet, Take 12.5 mg by mouth at bedtime as needed for sleep., Disp: , Rfl:  .  doxycycline (VIBRA-TABS) 100 MG tablet, Take 1 tablet (100 mg total) by mouth 2 (two) times daily., Disp: 14 tablet, Rfl: 0 .  HYDROcodone-acetaminophen (NORCO) 5-325 MG tablet, Take 1 tablet by mouth every 4 (four) hours as needed for moderate pain., Disp: 20 tablet, Rfl: 0  Social History   Tobacco Use  Smoking Status Never Smoker  Smokeless Tobacco Never Used    Allergies  Allergen Reactions  . Shellfish Allergy Anaphylaxis  . Clindamycin/Lincomycin Nausea Only  . Percocet [Oxycodone-Acetaminophen] Other (See Comments)    Hallucination  . Tylenol With Codeine #3 [Acetaminophen-Codeine] Other (See Comments)    "MADE ME FEEL HORRIBLE"  . Lodine [Etodolac] Rash  . Naproxen Rash  . Penicillins Rash    .Marland Kitchen  Has patient had a PCN reaction causing immediate rash, facial/tongue/throat swelling, SOB or lightheadedness with hypotension: No Has patient had a PCN reaction causing severe rash involving mucus membranes or skin necrosis: No Has patient had a PCN reaction that required hospitalization No Has patient had a PCN reaction occurring within the last 10 years: No If all of the above answers are "NO", then may proceed with Cephalosporin use.     Objective:  There were no vitals filed for this visit. There is no height or weight on file to calculate BMI. Constitutional Well developed. Well nourished.  Vascular Dorsalis pedis pulses palpable bilaterally. Posterior tibial pulses palpable bilaterally. Capillary  refill normal to all digits.  No cyanosis or clubbing noted. Pedal hair growth normal.  Neurologic Normal speech. Oriented to person, place, and time. Epicritic sensation to light touch grossly present bilaterally.  Dermatologic Nails well groomed and normal in appearance. No open wounds. Hyperkeratosis dorsal aspect of the fourth and fifth PIPJ's and interdigital space  Orthopedic:  Lesser digital contractures to the left fourth fifth toes with pain to palpation   Radiographs: Taken and reviewed incomplete arthroplasty of the fifth toe with fourth toe hammertoe none Assessment:   1. Hammertoe of left foot   2. Toe pain, left   3. Capsulitis of toe of left foot   4. Adductovarus rotation of toe, acquired, left    Plan:  Patient was evaluated and treated and all questions answered.  Hammertoes left foot -X-rays reviewed as above -Discussed with patient performing revision arthroplasty of the fifth toe with further arthroplasty of the fourth toe.  Patient desires to have the procedure done in the office for reasons of cost --Patient has failed all conservative therapy and wishes to proceed with surgical intervention. All risks, benefits, and alternatives discussed with patient. No guarantees given. Consent reviewed and signed by patient. -Planned procedures: Left fourth fifth hammertoe correction   No follow-ups on file.

## 2019-07-03 DIAGNOSIS — J45909 Unspecified asthma, uncomplicated: Secondary | ICD-10-CM | POA: Insufficient documentation

## 2019-07-06 ENCOUNTER — Encounter: Payer: Self-pay | Admitting: Podiatry

## 2019-07-06 ENCOUNTER — Ambulatory Visit (INDEPENDENT_AMBULATORY_CARE_PROVIDER_SITE_OTHER): Payer: BC Managed Care – PPO | Admitting: Podiatry

## 2019-07-06 ENCOUNTER — Other Ambulatory Visit: Payer: Self-pay

## 2019-07-06 DIAGNOSIS — M2042 Other hammer toe(s) (acquired), left foot: Secondary | ICD-10-CM

## 2019-07-06 DIAGNOSIS — Z9889 Other specified postprocedural states: Secondary | ICD-10-CM

## 2019-07-06 NOTE — Progress Notes (Signed)
  Subjective:  Patient ID: Chelsea Wheeler, female    DOB: 1961-06-13,  MRN: [id number]  Chief Complaint  Patient presents with  . Routine Post Op    office surgery pov#1 dos 12.31.2020 Hammertoe Repair 4th, 5th Lt, pt shows no signs of infection, pt has been wearing surgical shoe, dressings/ sutures are intact, pain scale is a 4 out of 10, pt is taking pain meds prn, pt is also looking to get a work note to return back to work full time, pt has no other comments or concerns   DOS: 06/29/2019 Procedure: Hammertoe Correction 4th toe and 5th toe and left  59 y.o. female presents with the above complaint. History confirmed with patient.  Objective:  Physical Exam: tenderness at the surgical site, no edema noted, calf supple, nontender, and toes rectus. Incision: healing well, no significant drainage, no dehiscence, no significant erythema  Assessment:  1. Hammer toe of left foot   2. Post-operative state    Plan:  Patient was evaluated and treated and all questions answered.  Post-operative State -Dressing applied consisting of sterile gauze, kerlix, and ACE bandage -WBAT in Surgical shoe  -Suture removal next visit.  No follow-ups on file.

## 2019-07-14 ENCOUNTER — Other Ambulatory Visit: Payer: Self-pay

## 2019-07-14 ENCOUNTER — Ambulatory Visit (INDEPENDENT_AMBULATORY_CARE_PROVIDER_SITE_OTHER): Payer: BC Managed Care – PPO | Admitting: Podiatry

## 2019-07-14 DIAGNOSIS — M2042 Other hammer toe(s) (acquired), left foot: Secondary | ICD-10-CM

## 2019-07-14 DIAGNOSIS — Z9889 Other specified postprocedural states: Secondary | ICD-10-CM

## 2019-07-14 NOTE — Progress Notes (Signed)
  Subjective:  Patient ID: Chelsea Wheeler, female    DOB: 02/09/1961,  MRN: [id number]  No chief complaint on file.  DOS: 06/29/2019 Procedure: Hammertoe Correction 4th toe and 5th toe and left  59 y.o. female presents with the above complaint. History confirmed with patient. Doing well only having a little pain.  Objective:  Physical Exam: tenderness at the surgical site, no edema noted and calf supple, nontender. Incision: healing well, no significant drainage, no dehiscence  Assessment:   1. Hammer toe of left foot   2. Post-operative state     Plan:  Patient was evaluated and treated and all questions answered.  Post-operative State -Sutures removed -Steri-strips applied to the incision -Ok to start showering at this time. Advised they cannot soak. -WBAT in Surgical shoe  Return in about 2 weeks (around 07/28/2019) for Post-Op (No XRs).

## 2019-07-28 ENCOUNTER — Other Ambulatory Visit: Payer: Self-pay

## 2019-07-28 ENCOUNTER — Ambulatory Visit (INDEPENDENT_AMBULATORY_CARE_PROVIDER_SITE_OTHER): Payer: BC Managed Care – PPO | Admitting: Podiatry

## 2019-07-28 DIAGNOSIS — Z87898 Personal history of other specified conditions: Secondary | ICD-10-CM | POA: Insufficient documentation

## 2019-07-28 DIAGNOSIS — F411 Generalized anxiety disorder: Secondary | ICD-10-CM | POA: Insufficient documentation

## 2019-07-28 DIAGNOSIS — T7840XA Allergy, unspecified, initial encounter: Secondary | ICD-10-CM | POA: Insufficient documentation

## 2019-07-28 DIAGNOSIS — R519 Headache, unspecified: Secondary | ICD-10-CM | POA: Insufficient documentation

## 2019-07-28 DIAGNOSIS — Z9889 Other specified postprocedural states: Secondary | ICD-10-CM

## 2019-07-28 DIAGNOSIS — G4700 Insomnia, unspecified: Secondary | ICD-10-CM | POA: Insufficient documentation

## 2019-07-28 DIAGNOSIS — M2042 Other hammer toe(s) (acquired), left foot: Secondary | ICD-10-CM

## 2019-07-28 NOTE — Progress Notes (Signed)
  Subjective:  Patient ID: Chelsea Wheeler, female    DOB: [birth year],  MRN: [id number]  Chief Complaint  Patient presents with  . Routine Post Op    DOS 12.31.2020 HAMMERTOE REPAIR 4TH, 5TH Lt. Pt states "I have concerns about the healing process". Pt denies fever/nausea/vomiting/chills.   DOS: 06/29/2019 Procedure: Hammertoe Correction 4th toe and 5th toe and left  59 y.o. female presents with the above complaint. History confirmed with patient. Doing pretty well still having   Objective:  Physical Exam: no tenderness at the surgical site, no edema noted and calf supple, nontender. Incision: well healed Small interdigital corn 4th toe   Assessment:   1. Post-operative state   2. Hammer toe of left foot    Plan:  Patient was evaluated and treated and all questions answered.  Post-operative State -Healing well, small interdigital corn resolving -Continue post-op shoe for 1 week, then normal shoegear -F/u in 2 weeks for recheck.  Return in about 2 weeks (around 08/11/2019) for Post-Op (No XRs).

## 2019-08-16 ENCOUNTER — Other Ambulatory Visit: Payer: Self-pay | Admitting: Obstetrics and Gynecology

## 2019-08-16 DIAGNOSIS — R928 Other abnormal and inconclusive findings on diagnostic imaging of breast: Secondary | ICD-10-CM

## 2019-08-18 ENCOUNTER — Encounter: Payer: BC Managed Care – PPO | Admitting: Podiatry

## 2019-08-23 ENCOUNTER — Telehealth: Payer: Self-pay | Admitting: *Deleted

## 2019-08-23 NOTE — Telephone Encounter (Signed)
Pt left message with name, DOB and phone, no message.

## 2019-08-23 NOTE — Telephone Encounter (Signed)
I called Chelsea Wheeler and she missed her appt last week due to the weather and wanted to know when she was suppose to begin the regular shoe and reschedule. I reviewed 07/28/2019 clinicals and Dr. Samuella Cota wanted her to wear the surgical shoe 1 more week from that date and transfer to wider narrow shoe, I instructed Chelsea Wheeler to begin wearing the regular wider supportive shoe 1st thing in the morning and wear as long as it is comfortable, then switch back in the surgical shoe and continue until able to wear entire working day. 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 Chelsea Wheeler states it is her left foot, and I told her that in [place] she has to be able to wear a regular shoe to drive. I transferred Chelsea Wheeler to scheduler.

## 2019-08-26 ENCOUNTER — Ambulatory Visit: Payer: BC Managed Care – PPO | Attending: Internal Medicine

## 2019-08-26 DIAGNOSIS — Z23 Encounter for immunization: Secondary | ICD-10-CM | POA: Insufficient documentation

## 2019-08-26 NOTE — Progress Notes (Signed)
   Covid-19 Vaccination Clinic  Name:  Chelsea Wheeler    MRN: [id number] DOB: [birth year]  08/26/2019  [name] was observed post Covid-19 immunization for 15 minutes without incidence. She was provided with Vaccine Information Sheet and instruction to access the V-Safe system.   [name] was instructed to call 911 with any severe reactions post vaccine: Marland Kitchen Difficulty breathing  . Swelling of your face and throat  . A fast heartbeat  . A bad rash all over your body  . Dizziness and weakness    Immunizations Administered    Name Date Dose VIS Date Route   Pfizer COVID-19 Vaccine 08/26/2019  5:38 PM 0.3 mL 06/09/2019 Intramuscular   Manufacturer: [employer], [employer]   Lot: [id number]   NDC: [address]-9

## 2019-09-01 ENCOUNTER — Ambulatory Visit
Admission: RE | Admit: 2019-09-01 | Discharge: 2019-09-01 | Disposition: A | Discharge status: Home / Self Care | Payer: BC Managed Care – PPO | Source: Ambulatory Visit | Attending: Obstetrics and Gynecology | Admitting: Obstetrics and Gynecology

## 2019-09-01 ENCOUNTER — Other Ambulatory Visit: Payer: Self-pay

## 2019-09-01 DIAGNOSIS — R928 Other abnormal and inconclusive findings on diagnostic imaging of breast: Secondary | ICD-10-CM

## 2019-09-16 ENCOUNTER — Ambulatory Visit: Payer: BC Managed Care – PPO | Attending: Internal Medicine

## 2019-09-16 DIAGNOSIS — Z23 Encounter for immunization: Secondary | ICD-10-CM

## 2019-09-16 NOTE — Progress Notes (Signed)
   Covid-19 Vaccination Clinic  Name:  Chelsea Wheeler    MRN: [id number] DOB: 04-26-1961  09/16/2019  [name] was observed post Covid-19 immunization for 15 minutes without incident. She was provided with Vaccine Information Sheet and instruction to access the V-Safe system.   [name] was instructed to call 911 with any severe reactions post vaccine: Marland Kitchen Difficulty breathing  . Swelling of face and throat  . A fast heartbeat  . A bad rash all over body  . Dizziness and weakness   Immunizations Administered    Name Date Dose VIS Date Route   Pfizer COVID-19 Vaccine 09/16/2019  8:50 AM 0.3 mL 06/09/2019 Intramuscular   Manufacturer: [employer], [employer]   Lot: [id number]   NDC: [address]-6

## 2019-09-22 ENCOUNTER — Ambulatory Visit: Payer: BC Managed Care – PPO | Admitting: Podiatry

## 2019-09-28 ENCOUNTER — Other Ambulatory Visit: Payer: Self-pay

## 2019-09-28 ENCOUNTER — Ambulatory Visit (INDEPENDENT_AMBULATORY_CARE_PROVIDER_SITE_OTHER): Payer: BC Managed Care – PPO | Admitting: Podiatry

## 2019-09-28 VITALS — Temp 97.2°F

## 2019-09-28 DIAGNOSIS — M2042 Other hammer toe(s) (acquired), left foot: Secondary | ICD-10-CM | POA: Diagnosis not present

## 2019-10-18 ENCOUNTER — Emergency Department: Payer: BC Managed Care – PPO

## 2019-10-18 ENCOUNTER — Emergency Department
Admission: EM | Admit: 2019-10-18 | Discharge: 2019-10-18 | Disposition: A | Discharge status: Home / Self Care | Payer: BC Managed Care – PPO | Attending: Emergency Medicine | Admitting: Emergency Medicine

## 2019-10-18 ENCOUNTER — Other Ambulatory Visit: Payer: Self-pay

## 2019-10-18 DIAGNOSIS — I10 Essential (primary) hypertension: Secondary | ICD-10-CM | POA: Diagnosis not present

## 2019-10-18 DIAGNOSIS — R0602 Shortness of breath: Secondary | ICD-10-CM | POA: Diagnosis not present

## 2019-10-18 DIAGNOSIS — R0789 Other chest pain: Secondary | ICD-10-CM

## 2019-10-18 DIAGNOSIS — K219 Gastro-esophageal reflux disease without esophagitis: Secondary | ICD-10-CM | POA: Insufficient documentation

## 2019-10-18 LAB — BASIC METABOLIC PANEL
Anion gap: 7 (ref 5–15)
BUN: 11 mg/dL (ref 6–20)
CO2: 27 mmol/L (ref 22–32)
Calcium: 9.3 mg/dL (ref 8.9–10.3)
Chloride: 105 mmol/L (ref 98–111)
Creatinine, Ser: 0.87 mg/dL (ref 0.44–1.00)
GFR calc Af Amer: >60 mL/min (ref >60)
GFR calc non Af Amer: >60 mL/min (ref >60)
Glucose, Bld: 106 mg/dL — ABNORMAL HIGH (ref 70–99)
Potassium: 3.5 mmol/L (ref 3.5–5.1)
Sodium: 139 mmol/L (ref 135–145)

## 2019-10-18 LAB — TROPONIN I (HIGH SENSITIVITY)
Troponin I (High Sensitivity): 3 ng/L (ref <18)
Troponin I (High Sensitivity): 3 ng/L (ref <18)

## 2019-10-18 LAB — CBC
HCT: 36.7 % (ref 36.0–46.0)
Hemoglobin: 12.4 g/dL (ref 12.0–15.0)
MCH: 29.3 pg (ref 26.0–34.0)
MCHC: 33.8 g/dL (ref 30.0–36.0)
MCV: 86.8 fL (ref 80.0–100.0)
Platelets: 156 10*3/uL (ref 150–400)
RBC: 4.23 MIL/uL (ref 3.87–5.11)
RDW: 12.4 % (ref 11.5–15.5)
WBC: 6.6 10*3/uL (ref 4.0–10.5)
nRBC: 0 % (ref 0.0–0.2)

## 2019-10-18 MED ORDER — CYCLOBENZAPRINE HCL 10 MG PO TABS: 10.0000 mg | ORAL_TABLET | Freq: Three times a day (TID) | ORAL | 0 refills | Status: AC | PRN

## 2019-10-18 NOTE — ED Provider Notes (Signed)
[facility] Emergency Department Provider Note  ____________________________________________   First MD Initiated Contact with Patient 10/18/19 1111     (approximate)  I have reviewed the triage vital signs and the nursing notes.   HISTORY  Chief Complaint Chest Pain    HPI Chelsea Wheeler is a 59 y.o. female with past medical history of GERD, hypertension, anxiety, here with chest pain.  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  She states that while driving, she began to experience a sharp, cramp-like sensation in her left lateral chest.  This shot up towards her arm and down towards her fourth and fifth fingers.  She began to feel anxious.  She pulled over, called her supervisor.  After this, she began to feel hot, flushed, anxious, and like she could not breathe.  The symptoms have since resolved.  She was given aspirin and nitroglycerin, which she thinks did help her symptoms, though they were also improving at the time.  No fevers or chills.  No cough.  No sputum production.  No leg swelling.  Denies  history of similar issues in the past.  No known coronary history.  Symptoms do see slightly worse with movement.  No alleviating factors.       Past Medical History:  Diagnosis Date  . Arthritis    knee  . Claustrophobia   . GERD (gastroesophageal reflux disease)   . Hypertension   . Seasonal allergies     Patient Active Problem List   Diagnosis Date Noted  . Allergy 07/28/2019  . Chronic headaches 07/28/2019  . Generalized anxiety disorder 07/28/2019  . History of ulcer disease 07/28/2019  . Insomnia 07/28/2019  . Asthma 07/03/2019  . Pain due to total right knee replacement ([place]) 04/03/2019  . 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 Hypertension 12/26/2018  .  Trigger finger of left thumb 08/25/2018  . Osteoporosis 06/24/2018  . Carpal tunnel syndrome, left upper limb 11/23/2017  . Unilateral primary osteoarthritis, left knee 10/14/2016  . Osteoarthritis of right knee 09/06/2015  . Status post total right knee replacement 09/06/2015  . Cough 09/14/2012    Past Surgical History:  Procedure Laterality Date  . CORRECTION HAMMER TOE Left 06/29/2019  . ENDOMETRIAL ABLATION  2007  . KNEE ARTHROSCOPY Right 03/22/2015   Procedure: ARTHROSCOPY KNEE, partial lateral meniscectomy, microfracture, chondraplasty;  Surgeon: Leanor Kail, MD;  Location: [facility] ORS;  Service: Orthopedics;  Laterality: Right;  . TOTAL KNEE ARTHROPLASTY Right 09/06/2015   Procedure: RIGHT TOTAL KNEE ARTHROPLASTY;  Surgeon: Mcarthur Rossetti, MD;  Location: [facility] ORS;  Service: Orthopedics;  Laterality: Right;  . TUBAL LIGATION  08-31-06    Prior to Admission medications   Medication Sig Start Date End Date Taking? Authorizing Provider  albuterol (VENTOLIN HFA) 108 (90 Base) MCG/ACT inhaler INHALE 2 PUFFS BY MOUTH EVERY 6 HOURS AS NEEDED FOR WHEEZING 02/02/19   [provider]  Azelastine-Fluticasone 137-50 MCG/ACT SUSP USE 1 SPRAY IN EACH NOSTRIL TWICE DAILY 07/25/19   [provider]  cetirizine (ZYRTEC) 10 MG tablet Take 10 mg by mouth daily.     [provider]  Cholecalciferol (VITAMIN D3 PO) Take 1 tablet by mouth daily.     [provider]  cyclobenzaprine (FLEXERIL) 10 MG tablet Take 1 tablet (10 mg total) by mouth 3 (three) times daily as needed  for up to 7 days for muscle spasms. 10/18/19 10/25/19  Shaune Pollack, MD  diclofenac sodium (VOLTAREN) 1 % GEL Apply 2 g topically 4 (four) times daily. 11/23/17   Kathryne Hitch, MD  esomeprazole (NEXIUM) 40 MG capsule Take 40 mg by mouth every morning.     [provider]  fluticasone (FLONASE) 50 MCG/ACT nasal spray Place 1 spray into both nostrils daily as needed for allergies  or rhinitis.     [provider]  hydrochlorothiazide (HYDRODIURIL) 25 MG tablet Take 25 mg by mouth every morning.     [provider]  Ketoconazole 2 % FOAM Apply fingertip amount to space between toes 04/16/17   Park Liter, DPM  KLOR-CON M20 20 MEQ tablet  03/21/17   [provider]  losartan (COZAAR) 50 MG tablet Take 100 mg by mouth daily. 11/15/18   [provider]  montelukast (SINGULAIR) 10 MG tablet  03/17/17   [provider]  Multiple Vitamins-Minerals (ANTIOXIDANT FORMULA SG) capsule Take 1 capsule by mouth daily.    [provider]  potassium chloride (K-DUR) 10 MEQ tablet Take 10 mEq by mouth 2 (two) times daily.    [provider]  traMADol (ULTRAM) 50 MG tablet Take 1 tablet (50 mg total) by mouth 3 (three) times daily as needed. 11/05/17   Kathryne Hitch, MD  zolpidem (AMBIEN CR) 12.5 MG CR tablet Take 12.5 mg by mouth at bedtime as needed for sleep.    [provider]    Allergies Shellfish allergy, Clindamycin/lincomycin, Codeine, Percocet [oxycodone-acetaminophen], Tylenol with codeine #3 [acetaminophen-codeine], Lodine [etodolac], Naproxen, and Penicillins  Family History  Problem Relation Age of Onset  . Allergies Sister     Social History Social History   Tobacco Use  . Smoking status: Never Smoker  . Smokeless tobacco: Never Used  Substance Use Topics  . Alcohol use: No  . Drug use: No    Review of Systems  Review of Systems  Constitutional: Positive for fatigue. Negative for fever.  HENT: Negative for congestion and sore throat.   Eyes: Negative for visual disturbance.  Respiratory: Positive for shortness of breath. Negative for cough.   Cardiovascular: Positive for chest pain.  Gastrointestinal: Negative for abdominal pain, diarrhea, nausea and vomiting.  Genitourinary: Negative for flank pain.  Musculoskeletal: Negative for back pain and neck pain.  Skin: Negative for  rash and wound.  Neurological: Positive for weakness.  All other systems reviewed and are negative.    ____________________________________________  PHYSICAL EXAM:      VITAL SIGNS: ED Triage Vitals  Enc Vitals Group     BP 10/18/19 1059 115/78     Pulse Rate 10/18/19 1059 63     Resp 10/18/19 1059 18     Temp 10/18/19 1059 98.1 F (36.7 C)     Temp Source 10/18/19 1059 Oral     SpO2 10/18/19 1059 97 %     Weight 10/18/19 1053 195 lb (88.5 kg)     Height 10/18/19 1053 5\' 9"  (1.753 m)     Head Circumference --      Peak Flow --      Pain Score 10/18/19 1053 7     Pain Loc --      Pain Edu? --      Excl. in GC? --      Physical Exam Vitals and nursing note reviewed.  Constitutional:      General: She is not in acute distress.  Appearance: She is well-developed.  HENT:     Head: Normocephalic and atraumatic.  Eyes:     Conjunctiva/sclera: Conjunctivae normal.  Cardiovascular:     Rate and Rhythm: Normal rate and regular rhythm.     Heart sounds: Normal heart sounds. No murmur. No friction rub.  Pulmonary:     Effort: Pulmonary effort is normal. No respiratory distress.     Breath sounds: Normal breath sounds. No wheezing or rales.     Comments: Mild tenderness to palpation of the left lateral chest wall.  No bruising or deformity.  No rash. Abdominal:     General: There is no distension.     Palpations: Abdomen is soft.     Tenderness: There is no abdominal tenderness.  Musculoskeletal:     Cervical back: Neck supple.  Skin:    General: Skin is warm.     Capillary Refill: Capillary refill takes less than 2 seconds.  Neurological:     Mental Status: She is alert and oriented to person, place, and time.     Motor: No abnormal muscle tone.       ____________________________________________   LABS (all labs ordered are listed, but only abnormal results are displayed)  Labs Reviewed  BASIC METABOLIC PANEL - Abnormal; Notable for the following components:        Result Value   Glucose, Bld 106 (*)    All other components within normal limits  CBC  TROPONIN I (HIGH SENSITIVITY)  TROPONIN I (HIGH SENSITIVITY)    ____________________________________________  EKG: Normal sinus rhythm, ventricular rate 64.  QRS 112, QTc 432.  No acute ST elevations depressions.  No evidence of acute ischemia or infarct. ________________________________________  RADIOLOGY All imaging, including plain films, CT scans, and ultrasounds, independently reviewed by me, and interpretations confirmed via formal radiology reads.  ED MD interpretation:   Chest x-ray: Unremarkable  Official radiology report(s): DG Chest 2 View  Result Date: 10/18/2019 CLINICAL DATA:  Chest pain EXAM: CHEST - 2 VIEW COMPARISON:  02/15/2014 FINDINGS: Normal heart size. No pleural effusion or edema. No airspace densities identified. The visualized osseous structures are unremarkable. IMPRESSION: No active cardiopulmonary disease. Electronically Signed   By: Signa Kell M.D.   On: 10/18/2019 11:35    ____________________________________________  PROCEDURES   Procedure(s) performed (including Critical Care):  Procedures  ____________________________________________  INITIAL IMPRESSION / MDM / ASSESSMENT AND PLAN / ED COURSE  As part of my medical decision making, I reviewed the following data within the electronic MEDICAL RECORD NUMBER Nursing notes reviewed and incorporated, Old chart reviewed, Notes from prior ED visits, and [place] Controlled Substance Database       *Shanyce Daris was evaluated in Emergency Department on 10/18/2019 for the symptoms described in the history of present illness. She was evaluated in the context of the global COVID-19 pandemic, which necessitated consideration that the patient might be at risk for infection with the SARS-CoV-2 virus that causes COVID-19. Institutional protocols and algorithms that pertain to the evaluation of patients at risk  for COVID-19 are in a state of rapid change based on information released by regulatory bodies including the CDC and federal and state organizations. These policies and algorithms were followed during the patient's care in the ED.  Some ED evaluations and interventions may be delayed as a result of limited staffing during the pandemic.*     Medical Decision Making: 59 year old female with PMHx above here with fairly atypical left-sided chest pain. EKG is non-ischemic and trop  neg x 2 with low-risk HEART score<4 - do not suspect ACS. She is not tachycardic, tachypneic, hypoxic, and has no clinical signs to suggest DVT or PE. This pain began while driving a bus and I suspect it could be MSK related to turning the wheel vigorously. She has mild reproduction on exam. No skin rash or signs of zoster. She is otherwise well appearing, in NAD, with no apparent emergent pathology. Pain is not c/w dissection and pulses are symmetric.  Will treat with short course of flexeril as she cannot take NSAIDs, minimize heavy lifting, and outpatient follow-up.  ____________________________________________  FINAL CLINICAL IMPRESSION(S) / ED DIAGNOSES  Final diagnoses:  Atypical chest pain     MEDICATIONS GIVEN DURING THIS VISIT:  Medications - No data to display   ED Discharge Orders         Ordered    cyclobenzaprine (FLEXERIL) 10 MG tablet  3 times daily PRN     10/18/19 1326           Note:  This document was prepared using Dragon voice recognition software and may include unintentional dictation errors.   Shaune Pollack, MD 10/18/19 2019

## 2019-10-18 NOTE — ED Notes (Signed)
Pt given gingerale and updated on plan of care.

## 2019-10-18 NOTE — ED Triage Notes (Signed)
Pt comes EMS from work. Sudden onset of chest pain in center and to left arm. EKG normal with EMS. Had 325mg  aspirin and 1 nitro PTA. AOx4 and ambulatory Takes losartan for BP.

## 2019-12-30 NOTE — Progress Notes (Signed)
  Subjective:  Patient ID: Chelsea Wheeler, female    DOB: May 21, 1961,  MRN: [id number]  Chief Complaint  Patient presents with  . Post-op Follow-up    DOS 12.31.2020 HAMMERTOE REPAIR 4TH, 5TH Lt. Pt stated, "Not much pain - just some tenderness. The swelling went down".    DOS: 06/29/2019 Procedure: Hammertoe Correction 4th toe and 5th toe and left  59 y.o. female presents with the above complaint. History confirmed with patient. Doing pretty well still having   Objective:  Physical Exam: no tenderness at the surgical site, no edema noted and calf supple, nontender. Incision: well healed  Assessment:   1. Hammer toe of left foot    Plan:  Patient was evaluated and treated and all questions answered.  Post-operative State -Pleased with result, not having pain. -F/u PRN  No follow-ups on file.

## 2020-06-02 IMAGING — US US BREAST*R* LIMITED INC AXILLA
1 series · 7 of 7 positions shown · non-contrast
Comparison: Previous exam(s).

CLINICAL DATA: Recall from screening mammography with
tomosynthesis, possible asymmetry involving the UPPER RIGHT breast
at POSTERIOR depth visible only the MLO images.

EXAM:
DIGITAL DIAGNOSTIC RIGHT MAMMOGRAM WITH CAD AND TOMO
ULTRASOUND RIGHT BREAST

[Series 1: us breast*right* limited inc axilla · 0.07mm/px · 7 of 7 slices shown]
[im 1/7]
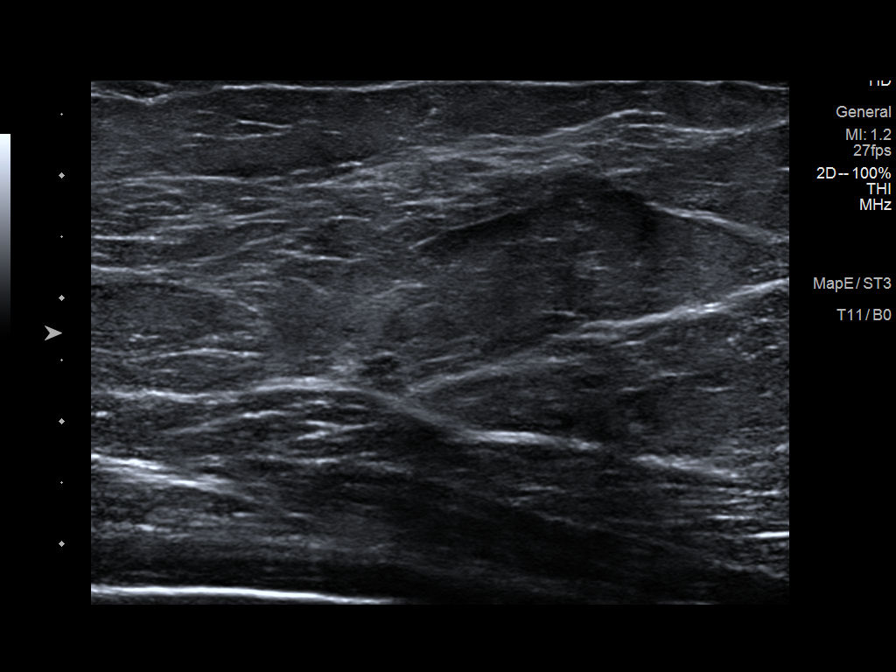
[im 2/7]
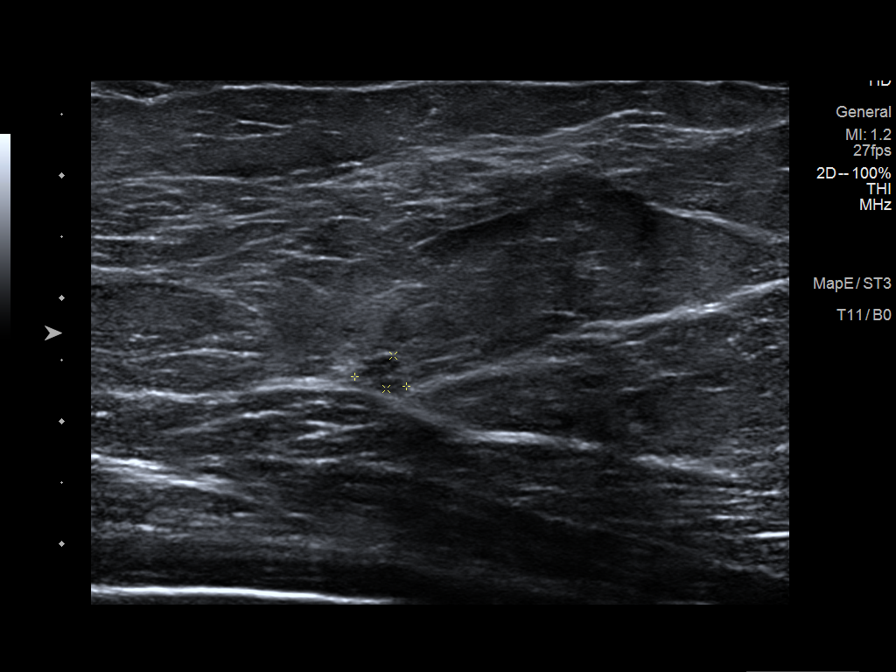
[im 3/7]
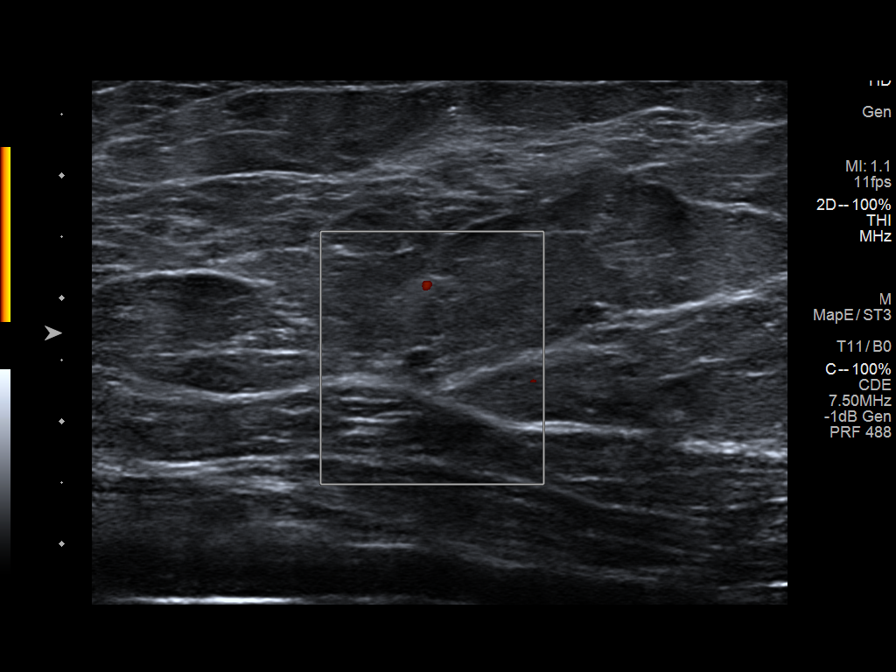
[im 4/7]
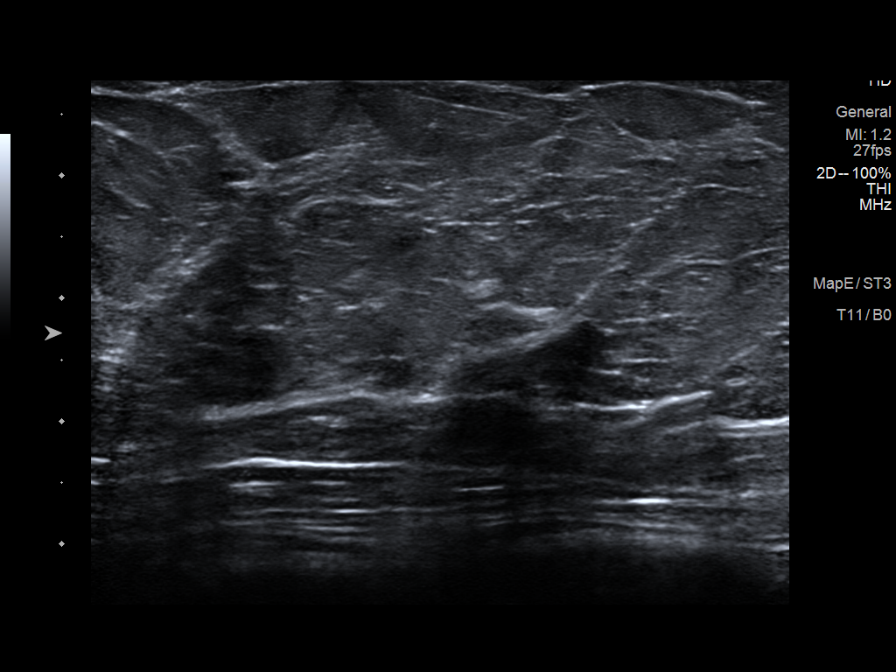
[im 5/7]
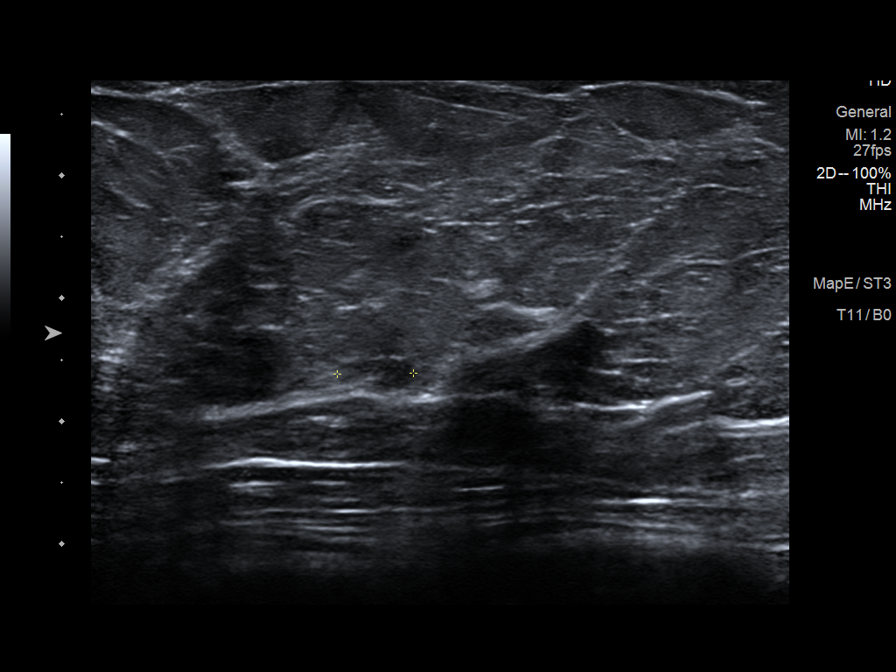
[im 6/7]
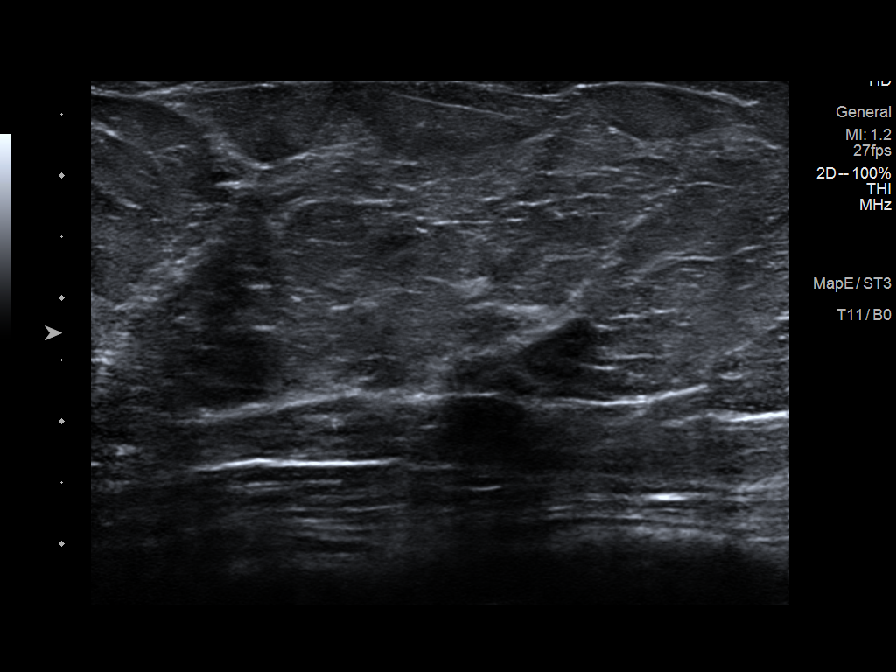
[im 7/7]
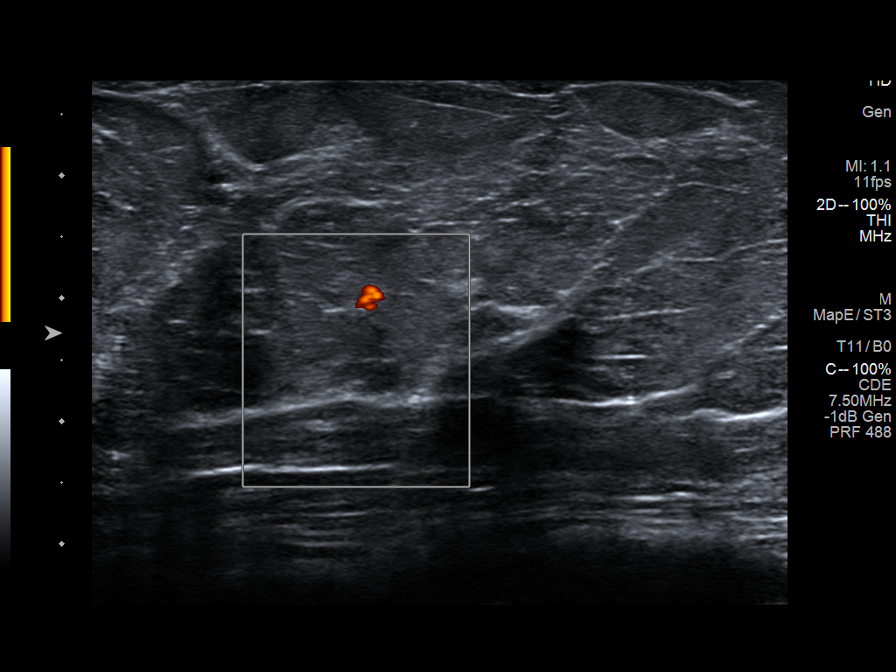

[7 of 7 positions shown; findings below may reference images not displayed]

ACR Breast Density Category b: There are scattered areas of
fibroglandular density.
FINDINGS: Tomosynthesis and synthesized spot compression MLO view of the area
of concern in the RIGHT breast and a tomosynthesis and synthesized
full field mediolateral view of the RIGHT breast were obtained.

The focal asymmetry persists on spot compression tomosynthesis
images, though partially disperses and may have central fat. There
is no associated architectural distortion or suspicious
calcifications.

The asymmetry moves inferiorly on the mediolateral images relative
to its location on the MLO images, indicating its location in the
OUTER breast, therefore likely UPPER OUTER QUADRANT.

The full field mediolateral image was processed with CAD.

Targeted RIGHT breast ultrasound is performed, showing a normal
appearing intramammary lymph node at the 30 o'clock position
approximately 10 cm from the nipple at far POSTERIOR depth measuring
approximately 3 x 4 x 6 mm, corresponding to the mammographic
finding. No suspicious solid mass or abnormal acoustic shadowing is
identified.
IMPRESSION: No mammographic or sonographic evidence of malignancy involving the
RIGHT breast.

RECOMMENDATION:
Screening mammogram in one year.(Code:AB-U-IZJ)

I have discussed the findings and recommendations with the patient.
If applicable, a reminder letter will be sent to the patient
regarding the next appointment.

BI-RADS CATEGORY  2: Benign.

## 2020-06-02 IMAGING — MG MM DIGITAL DIAGNOSTIC UNILAT*R* W/ TOMO W/ CAD
4 series · 4 of 12 positions shown · non-contrast
Comparison: Previous exam(s).

CLINICAL DATA: Recall from screening mammography with
tomosynthesis, possible asymmetry involving the UPPER RIGHT breast
at POSTERIOR depth visible only the MLO images.

EXAM:
DIGITAL DIAGNOSTIC RIGHT MAMMOGRAM WITH CAD AND TOMO
ULTRASOUND RIGHT BREAST

[R ML synth-2D]
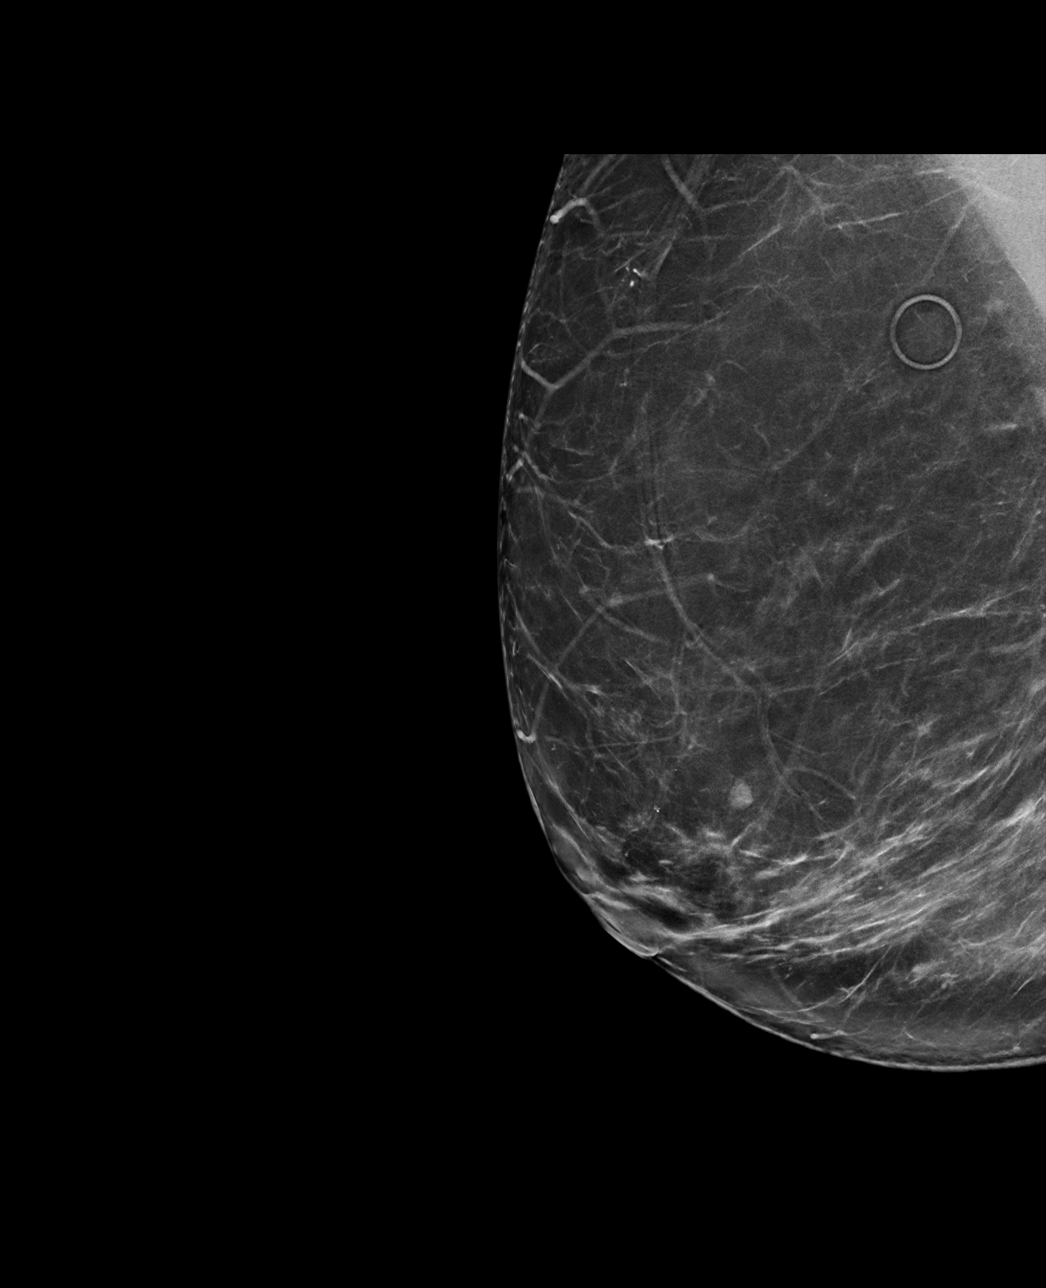

[R MLO synth-2D]
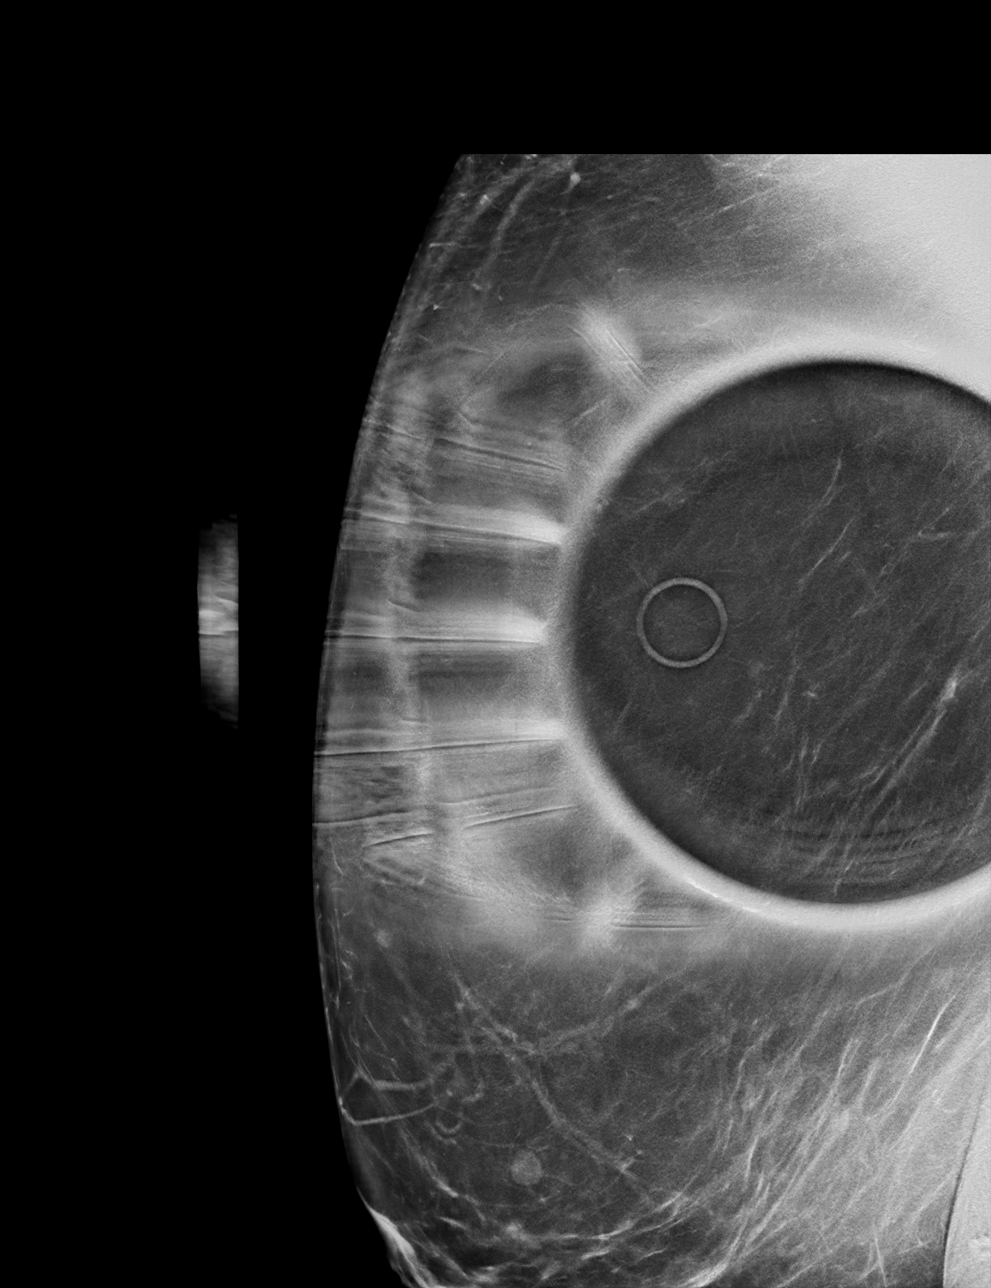

[R ML tomo · tomo slice 45/89.0]
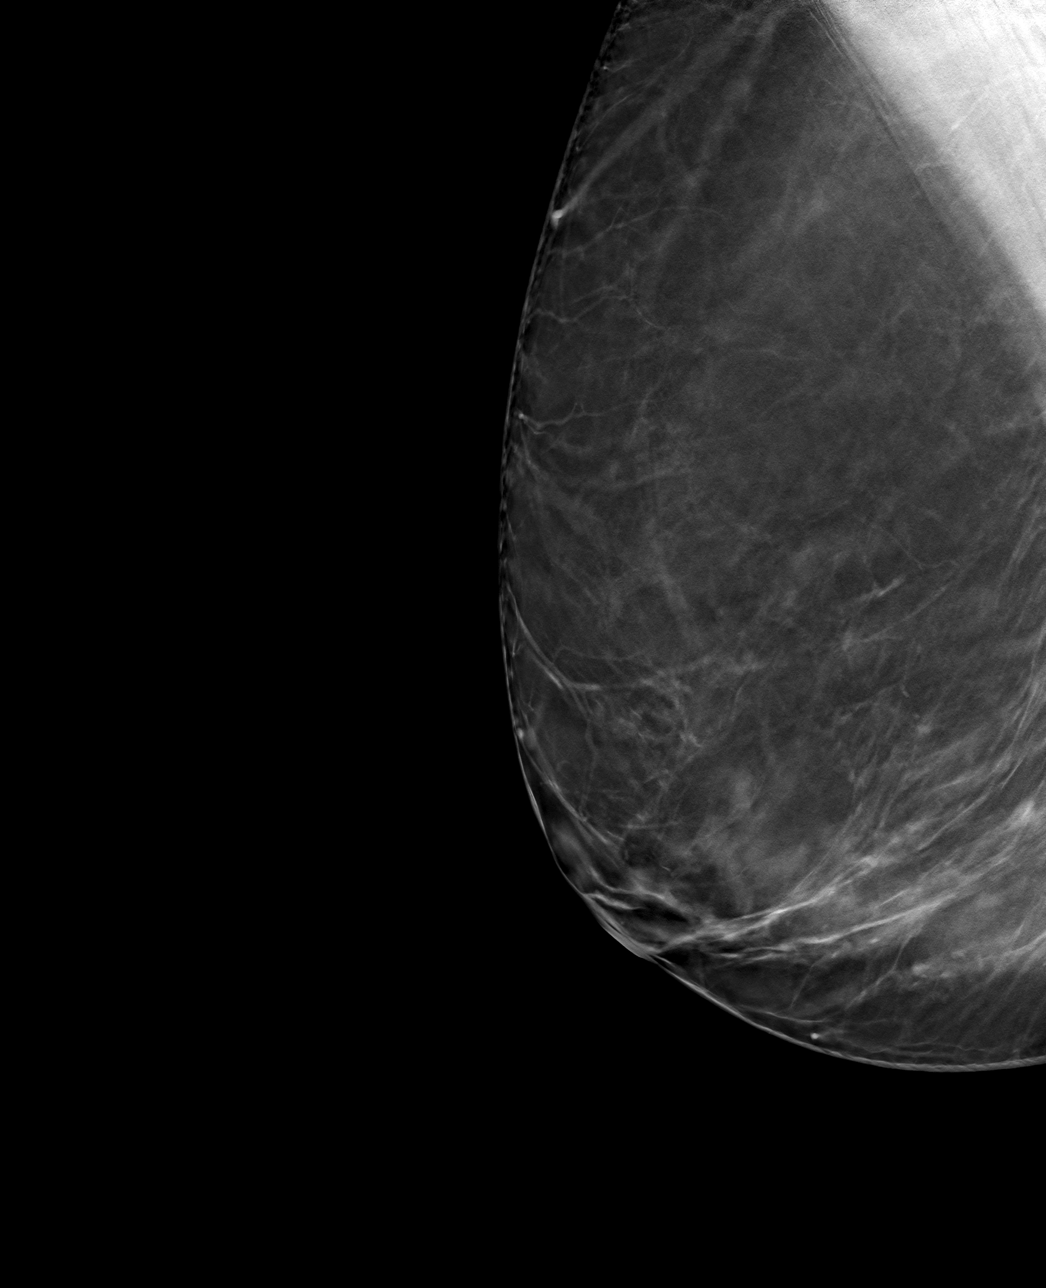

[R MLO tomo · tomo slice 51/100.0]
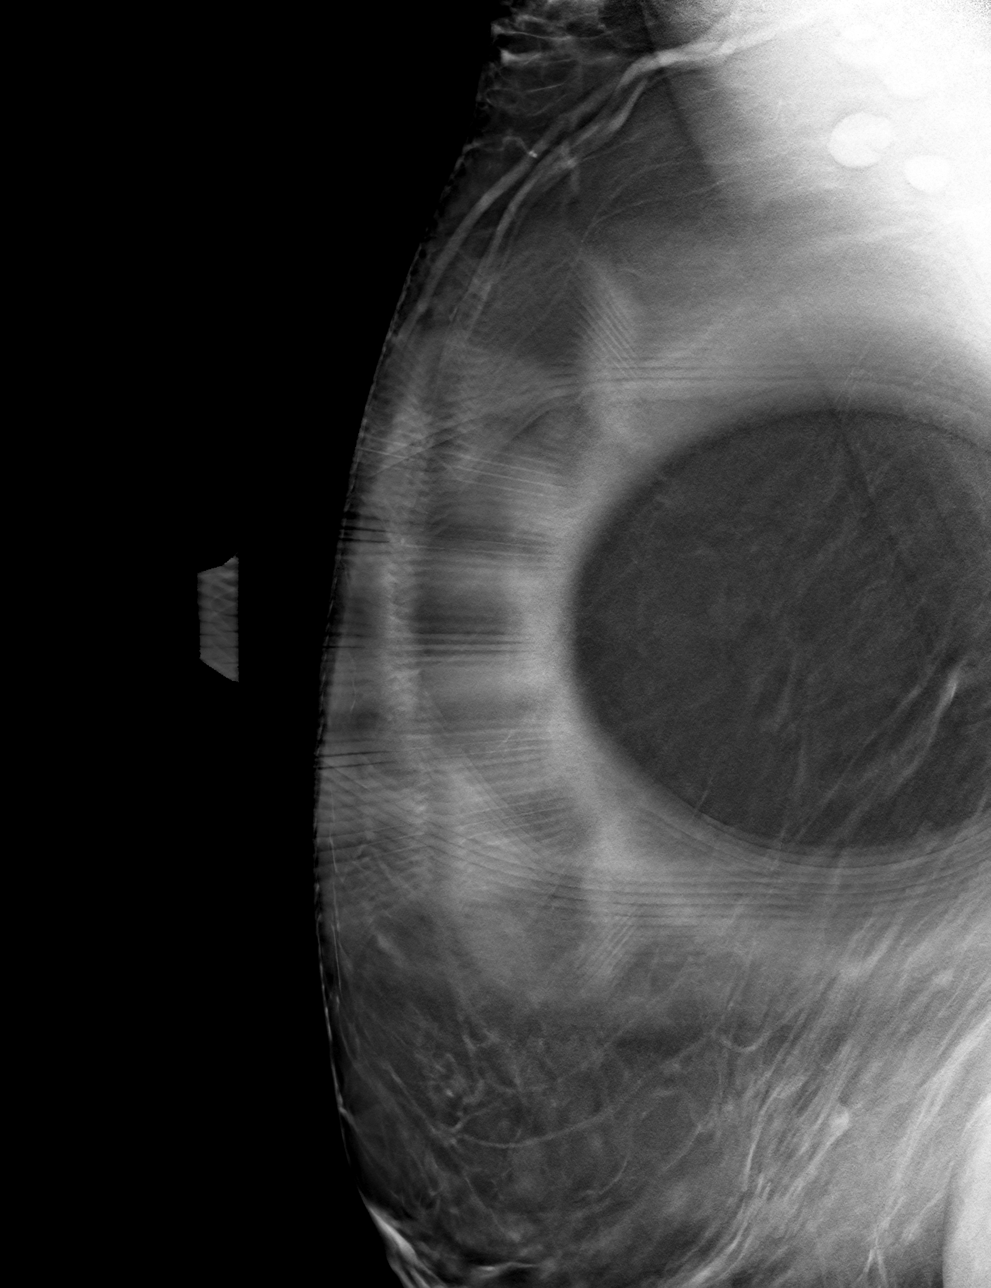

[4 of 12 positions shown; findings below may reference images not displayed]

ACR Breast Density Category b: There are scattered areas of
fibroglandular density.
FINDINGS: Tomosynthesis and synthesized spot compression MLO view of the area
of concern in the RIGHT breast and a tomosynthesis and synthesized
full field mediolateral view of the RIGHT breast were obtained.

The focal asymmetry persists on spot compression tomosynthesis
images, though partially disperses and may have central fat. There
is no associated architectural distortion or suspicious
calcifications.

The asymmetry moves inferiorly on the mediolateral images relative
to its location on the MLO images, indicating its location in the
OUTER breast, therefore likely UPPER OUTER QUADRANT.

The full field mediolateral image was processed with CAD.

Targeted RIGHT breast ultrasound is performed, showing a normal
appearing intramammary lymph node at the 30 o'clock position
approximately 10 cm from the nipple at far POSTERIOR depth measuring
approximately 3 x 4 x 6 mm, corresponding to the mammographic
finding. No suspicious solid mass or abnormal acoustic shadowing is
identified.
IMPRESSION: No mammographic or sonographic evidence of malignancy involving the
RIGHT breast.

RECOMMENDATION:
Screening mammogram in one year.(Code:AB-U-IZJ)

I have discussed the findings and recommendations with the patient.
If applicable, a reminder letter will be sent to the patient
regarding the next appointment.

BI-RADS CATEGORY  2: Benign.

## 2021-09-08 ENCOUNTER — Other Ambulatory Visit: Payer: Self-pay | Admitting: Physician Assistant

## 2021-09-08 ENCOUNTER — Telehealth: Payer: Self-pay

## 2021-09-08 MED ORDER — TRAMADOL HCL 50 MG PO TABS: 50.0000 mg | ORAL_TABLET | Freq: Three times a day (TID) | ORAL | 0 refills | Status: DC | PRN

## 2021-09-08 NOTE — Telephone Encounter (Signed)
Please advise 

## 2021-09-08 NOTE — Telephone Encounter (Signed)
Pt stated tramadol doesn't help and wont be picking it up ?

## 2021-09-08 NOTE — Telephone Encounter (Signed)
Called and advised pt. She stated understanding  

## 2021-09-08 NOTE — Telephone Encounter (Signed)
Patient called into the office and would like to know if she can have some pain medication sent in. She said from her past knee surgery it is starting to bother her again so she would like some Vicodin sent in  ?

## 2022-10-05 ENCOUNTER — Ambulatory Visit: Payer: BC Managed Care – PPO | Admitting: Orthopaedic Surgery

## 2022-11-02 ENCOUNTER — Other Ambulatory Visit: Payer: Self-pay | Admitting: Obstetrics and Gynecology

## 2022-11-02 DIAGNOSIS — R928 Other abnormal and inconclusive findings on diagnostic imaging of breast: Secondary | ICD-10-CM

## 2022-11-20 ENCOUNTER — Ambulatory Visit
Admission: RE | Admit: 2022-11-20 | Discharge: 2022-11-20 | Disposition: A | Discharge status: Home / Self Care | Payer: Self-pay | Source: Ambulatory Visit | Attending: Obstetrics and Gynecology | Admitting: Obstetrics and Gynecology

## 2022-11-20 ENCOUNTER — Ambulatory Visit
Admission: RE | Admit: 2022-11-20 | Discharge: 2022-11-20 | Disposition: A | Discharge status: Home / Self Care | Payer: BC Managed Care – PPO | Source: Ambulatory Visit | Attending: Obstetrics and Gynecology | Admitting: Obstetrics and Gynecology

## 2022-11-20 DIAGNOSIS — R928 Other abnormal and inconclusive findings on diagnostic imaging of breast: Secondary | ICD-10-CM

## 2022-12-07 ENCOUNTER — Encounter: Payer: Self-pay | Admitting: Orthopaedic Surgery

## 2022-12-07 ENCOUNTER — Telehealth: Payer: Self-pay | Admitting: Orthopaedic Surgery

## 2022-12-07 ENCOUNTER — Other Ambulatory Visit (INDEPENDENT_AMBULATORY_CARE_PROVIDER_SITE_OTHER): Payer: BC Managed Care – PPO

## 2022-12-07 ENCOUNTER — Other Ambulatory Visit: Payer: Self-pay | Admitting: Orthopaedic Surgery

## 2022-12-07 ENCOUNTER — Ambulatory Visit: Payer: BC Managed Care – PPO | Admitting: Orthopaedic Surgery

## 2022-12-07 DIAGNOSIS — T84062D Wear of articular bearing surface of internal prosthetic right knee joint, subsequent encounter: Secondary | ICD-10-CM | POA: Diagnosis not present

## 2022-12-07 DIAGNOSIS — Z96651 Presence of right artificial knee joint: Secondary | ICD-10-CM

## 2022-12-07 DIAGNOSIS — M25562 Pain in left knee: Secondary | ICD-10-CM

## 2022-12-07 DIAGNOSIS — G8929 Other chronic pain: Secondary | ICD-10-CM | POA: Diagnosis not present

## 2022-12-07 MED ORDER — LIDOCAINE HCL 1 % IJ SOLN
3.0000 mL | INTRAMUSCULAR | Status: AC | PRN
  Administered 2022-12-07: 3 mL

## 2022-12-07 MED ORDER — METHYLPREDNISOLONE ACETATE 40 MG/ML IJ SUSP
40.0000 mg | INTRAMUSCULAR | Status: AC | PRN
  Administered 2022-12-07: 40 mg via INTRA_ARTICULAR

## 2022-12-07 MED ORDER — CELECOXIB 200 MG PO CAPS: 200.0000 mg | ORAL_CAPSULE | Freq: Two times a day (BID) | ORAL | 1 refills | Status: DC | PRN

## 2022-12-07 NOTE — Telephone Encounter (Signed)
Patient called. Says Celebrex was suppose to be called into [facility] on [address]. Her cb# [phone]

## 2022-12-07 NOTE — Progress Notes (Signed)
The patient is a 62 year old female well-known to me.  We replaced her right knee back in 2017.  We had seen her a few years ago and recommended a polyliner exchange due to instability symptoms of the right knee.  There is a 9 mm thickness poly and on her exam there is just some play in that knee.  We had scheduled for surgery but she canceled this.  She said basically she is scared to go through any type of surgery due to the pain from the knee replacement itself.  She has been having some left knee pain on the medial aspect of left knee.  Examination of her right knee today shows her range of motion is full but she slightly hyperextended there is some laxity in the knee itself but there is no effusion of the right knee and it feels stable in terms of varus and valgus.  Again her range of motion is entirely full.  2 views of the right knee show well-seated total knee arthroplasty with no evidence of loosening or complicating features.  The alignment is neutral.  Her left knee shows just some slight medial joint space narrowing on the AP view.  Her left knee does have medial joint tenderness but no instability on exam and good range of motion.  I did recommend a steroid injection for her left knee and once again recommended a polyliner upsizing on the right knee.  I did again explain the risk and benefits of the surgery and what I feel that that would help her from a stability standpoint of that knee and decrease her pain.  I showed her knee replacement model and talked about what to expect from the intraoperative and postoperative course with the right knee.  She would like to consider having this polyliner exchange in September of this year.  I did place a steroid injection in her left knee which she tolerated well.  All question concerns were answered and addressed.  We will call her about scheduling her for that surgery for her right knee.     Procedure Note  Patient: Chelsea Wheeler              Date of Birth: 1961/01/31           MRN: [id number]             Visit Date: 12/07/2022  Procedures: Visit Diagnoses:  1. History of total right knee replacement   2. Chronic pain of left knee   3. 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 Consent was given by the patient. Immediately prior to procedure a time out was called to verify the correct patient, procedure, equipment, support staff and site/side marked as required. Patient was prepped and draped in the usual sterile fashion.

## 2022-12-17 NOTE — Telephone Encounter (Signed)
Patient called and left a voice mail for [place], and Sherrie forwarded to me.  Patient says in the VM that "if the incorrect sized polyliner was put in place, shouldn't something be done with/to Dr Magnus Ivan?  If he put wrong size in?"  I am unsure what she is asking exactly or what to tell her. I see that the office note says  recommend surgery and a bigger liner.  Can you please call patient to answer her questions?  [phone]

## 2023-01-06 ENCOUNTER — Ambulatory Visit
Admission: EM | Admit: 2023-01-06 | Discharge: 2023-01-06 | Disposition: A | Discharge status: Home / Self Care | Payer: BC Managed Care – PPO | Attending: Urgent Care | Admitting: Urgent Care

## 2023-01-06 ENCOUNTER — Encounter: Payer: Self-pay | Admitting: Emergency Medicine

## 2023-01-06 DIAGNOSIS — R109 Unspecified abdominal pain: Secondary | ICD-10-CM | POA: Diagnosis not present

## 2023-01-06 DIAGNOSIS — N3000 Acute cystitis without hematuria: Secondary | ICD-10-CM | POA: Diagnosis not present

## 2023-01-06 LAB — POCT URINALYSIS DIP (MANUAL ENTRY)
Bilirubin, UA: NEGATIVE
Blood, UA: NEGATIVE
Glucose, UA: NEGATIVE mg/dL
Ketones, POC UA: NEGATIVE mg/dL
Nitrite, UA: NEGATIVE
Protein Ur, POC: NEGATIVE mg/dL
Spec Grav, UA: 1.02 (ref 1.010–1.025)
Urobilinogen, UA: 1 E.U./dL
pH, UA: 7 (ref 5.0–8.0)

## 2023-01-06 MED ORDER — CIPROFLOXACIN HCL 500 MG PO TABS: 500.0000 mg | ORAL_TABLET | Freq: Two times a day (BID) | ORAL | 0 refills | Status: DC

## 2023-01-06 NOTE — ED Provider Notes (Addendum)
Renaldo Fiddler    CSN: [id number] Arrival date & time: 01/06/23  1652      History   Chief Complaint Chief Complaint  Patient presents with   Back Pain    HPI Chelsea Wheeler is a 62 y.o. female.    Back Pain   Presents to urgent care with concern for left flank pain.  Patient reports she was doing cleaning 5 days ago when symptoms started.  She has been treating with heat and ibuprofen and reports worsening pain.  Endorses chronic urinary frequency.  Denies dysuria.  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) 04/03/2019   Hypertension 12/26/2018   Trigger finger of left thumb 08/25/2018   Osteoporosis 06/24/2018   Carpal tunnel syndrome, left upper limb 11/23/2017   Unilateral primary osteoarthritis, left knee 10/14/2016   Osteoarthritis of right knee 09/06/2015   Status post total right knee replacement 09/06/2015   Cough 09/14/2012    Past Surgical History:  Procedure Laterality Date   CORRECTION HAMMER TOE Left 06/29/2019   ENDOMETRIAL ABLATION  2007   KNEE ARTHROSCOPY Right 03/22/2015   Procedure: ARTHROSCOPY KNEE, partial lateral meniscectomy, microfracture, chondraplasty;  Surgeon: Erin Sons, MD;  Location: [facility];  Service: Orthopedics;  Laterality: Right;   TOTAL KNEE ARTHROPLASTY Right 09/06/2015   Procedure: RIGHT TOTAL KNEE ARTHROPLASTY;  Surgeon: Kathryne Hitch, MD;  Location: [facility];  Service: Orthopedics;   Laterality: Right;   TUBAL LIGATION  08-31-06    OB History   No obstetric history on file.      Home Medications    Prior to Admission medications   Medication Sig Start Date End Date Taking? Authorizing Provider  albuterol (VENTOLIN HFA) 108 (90 Base) MCG/ACT inhaler INHALE 2 PUFFS BY MOUTH EVERY 6 HOURS AS NEEDED FOR WHEEZING 02/02/19   [provider]  Azelastine-Fluticasone 137-50 MCG/ACT SUSP USE 1 SPRAY IN EACH NOSTRIL TWICE DAILY 07/25/19   [provider]  celecoxib (CELEBREX) 200 MG capsule Take 1 capsule (200 mg total) by mouth 2 (two) times daily between meals as needed. 12/07/22   Kathryne Hitch, MD  cetirizine (ZYRTEC) 10 MG tablet Take 10 mg by mouth daily.     [provider]  Cholecalciferol (VITAMIN D3 PO) Take 1 tablet by mouth daily.     [provider]  diclofenac sodium (VOLTAREN) 1 % GEL Apply 2 g topically 4 (four) times daily. 11/23/17   Kathryne Hitch, MD  esomeprazole (NEXIUM) 40 MG capsule Take 40 mg by mouth every morning.     [provider]  fluticasone (FLONASE) 50 MCG/ACT nasal spray Place 1 spray into both nostrils daily as needed for allergies or rhinitis.     [provider]  hydrochlorothiazide (HYDRODIURIL) 25 MG tablet Take 25 mg by mouth every morning.     [provider]  Ketoconazole 2 % FOAM Apply fingertip amount to space between toes 04/16/17   Ventura Sellers  J, DPM  KLOR-CON M20 20 MEQ tablet  03/21/17   [provider]  losartan (COZAAR) 50 MG tablet Take 100 mg by mouth daily. 11/15/18   [provider]  montelukast (SINGULAIR) 10 MG tablet  03/17/17   [provider]  Multiple Vitamins-Minerals (ANTIOXIDANT FORMULA SG) capsule Take 1 capsule by mouth daily.    [provider]  potassium chloride (K-DUR) 10 MEQ tablet Take 10 mEq by mouth 2 (two) times daily.    [provider]  traMADol (ULTRAM) 50 MG tablet Take 1 tablet (50  mg total) by mouth 3 (three) times daily as needed. 09/08/21   Kirtland Bouchard, PA-C  zolpidem (AMBIEN CR) 12.5 MG CR tablet Take 12.5 mg by mouth at bedtime as needed for sleep.    [provider]    Family History Family History  Problem Relation Age of Onset   Allergies Sister     Social History Social History   Tobacco Use   Smoking status: Never   Smokeless tobacco: Never  Substance Use Topics   Alcohol use: No   Drug use: No     Allergies   Shellfish allergy, Clindamycin/lincomycin, Codeine, Percocet [oxycodone-acetaminophen], Tylenol with codeine #3 [acetaminophen-codeine], Lodine [etodolac], Naproxen, and Penicillins   Review of Systems Review of Systems  Musculoskeletal:  Positive for back pain.    Physical Exam Triage Vital Signs ED Triage Vitals  Enc Vitals Group     BP 01/06/23 1700 139/81     Pulse Rate 01/06/23 1700 71     Resp 01/06/23 1700 16     Temp 01/06/23 1700 99.4 F (37.4 C)     Temp Source 01/06/23 1700 Oral     SpO2 01/06/23 1700 93 %     Weight --      Height --      Head Circumference --      Peak Flow --      Pain Score 01/06/23 1703 10     Pain Loc --      Pain Edu? --      Excl. in GC? --    No data found.  Updated Vital Signs BP 139/81 (BP Location: Left Arm)   Pulse 71   Temp 99.4 F (37.4 C) (Oral)   Resp 16   SpO2 93%   Visual Acuity Right Eye Distance:   Left Eye Distance:   Bilateral Distance:    Right Eye Near:   Left Eye Near:    Bilateral Near:     Physical Exam Vitals reviewed.  Constitutional:      Appearance: Normal appearance.  Abdominal:     Tenderness: There is no abdominal tenderness. There is left CVA tenderness.  Skin:    General: Skin is warm and dry.  Neurological:     General: No focal deficit present.     Mental Status: She is alert and oriented to person, place, and time.  Psychiatric:        Mood and Affect: Mood normal.        Behavior: Behavior normal.      UC  Treatments / Results  Labs (all labs ordered are listed, but only abnormal results are displayed) Labs Reviewed - No data to display  EKG   Radiology No results found.  Procedures Procedures (including critical care time)  Medications Ordered in UC Medications - No data to display  Initial Impression / Assessment and Plan / UC Course  I have reviewed the triage  vital signs and the nursing notes.  Pertinent labs & imaging results that were available during my care of the patient were reviewed by me and considered in my medical decision making (see chart for details).   Chelsea Wheeler is a 62 y.o. female presenting with L flank pain. Patient is afebrile without recent antipyretics, satting well on room air. Overall is well appearing, well hydrated, without respiratory distress. Marked L CVA tenderness.  Reviewed relevant chart history.   Anshul diagnoses considered include, but not limited to UTI, pyelonephritis, muscle spasm/strain, renal calculi.  UA is significant for large leukocytes.  Negative blood.  Will treat her for acute cystitis with Cipro 500 mg twice daily x 7 days due to concern for pyelonephritis.  Able to provide Toradol IM for pain relief given her intolerance for NSAIDs.  Counseled patient on potential for adverse effects with medications prescribed/recommended today, ER and return-to-clinic precautions discussed, patient verbalized understanding and agreement with care plan.  Final Clinical Impressions(s) / UC Diagnoses   Final diagnoses:  None   Discharge Instructions   None    ED Prescriptions   None    PDMP not reviewed this encounter.   Charma Igo, FNP 01/06/23 1739    Charma Igo, FNP 01/06/23 1740

## 2023-01-06 NOTE — Discharge Instructions (Addendum)
Go to the emergency room if you have worsening pain, develop fever, chills or bodyaches.

## 2023-01-06 NOTE — ED Triage Notes (Signed)
Was doing some cleaning Friday and began to have some left flank pain. Treated with heat and ibuprofen, pain has continued to worsen. Does describe some chronic urinary frequency, denies dysuria

## 2023-03-05 ENCOUNTER — Ambulatory Visit (HOSPITAL_COMMUNITY): Admit: 2023-03-05 | Payer: BC Managed Care – PPO | Admitting: Orthopaedic Surgery

## 2023-03-05 SURGERY — SYNOVECTOMY WITH POLY EXCHANGE
Anesthesia: Spinal | Site: Knee | Laterality: Right

## 2023-03-18 ENCOUNTER — Encounter: Payer: BC Managed Care – PPO | Admitting: Orthopaedic Surgery

## 2023-11-10 ENCOUNTER — Ambulatory Visit
Admission: EM | Admit: 2023-11-10 | Discharge: 2023-11-10 | Disposition: A | Discharge status: Home / Self Care | Attending: Emergency Medicine | Admitting: Emergency Medicine

## 2023-11-10 ENCOUNTER — Other Ambulatory Visit: Payer: Self-pay

## 2023-11-10 DIAGNOSIS — R350 Frequency of micturition: Secondary | ICD-10-CM | POA: Diagnosis present

## 2023-11-10 DIAGNOSIS — R519 Headache, unspecified: Secondary | ICD-10-CM | POA: Insufficient documentation

## 2023-11-10 DIAGNOSIS — M546 Pain in thoracic spine: Secondary | ICD-10-CM | POA: Insufficient documentation

## 2023-11-10 LAB — POCT URINALYSIS DIP (MANUAL ENTRY)
Bilirubin, UA: NEGATIVE
Blood, UA: NEGATIVE
Glucose, UA: NEGATIVE mg/dL
Ketones, POC UA: NEGATIVE mg/dL
Leukocytes, UA: NEGATIVE
Nitrite, UA: NEGATIVE
Protein Ur, POC: NEGATIVE mg/dL
Spec Grav, UA: 1.01 (ref 1.010–1.025)
Urobilinogen, UA: 1 U/dL
pH, UA: 7 (ref 5.0–8.0)

## 2023-11-10 MED ORDER — PREDNISONE 10 MG (21) PO TBPK: ORAL_TABLET | Freq: Every day | ORAL | 0 refills | Status: DC

## 2023-11-10 MED ORDER — DEXAMETHASONE SODIUM PHOSPHATE 10 MG/ML IJ SOLN
10.0000 mg | Freq: Once | INTRAMUSCULAR | Status: AC
  Administered 2023-11-10: 10 mg via INTRAMUSCULAR

## 2023-11-10 MED ORDER — BACLOFEN 10 MG PO TABS
10.0000 mg | ORAL_TABLET | Freq: Every evening | ORAL | 0 refills | Status: DC | PRN
Start: 2023-11-10 — End: 2024-03-03

## 2023-11-10 NOTE — ED Triage Notes (Signed)
 Pt is here with concerns of HBP with a headache that started 6 weeks ago, pt also complaints of frequency of urination and mild back pain that started 6 weeks ago. Pt has taken Tylenol  to relieve discomfort.

## 2023-11-10 NOTE — Discharge Instructions (Addendum)
\  This time I do not believe any of your symptoms are related  Urinalysis is negative for bladder infection, has been sent to the lab to see if bacteria will grow, you will be notified if this occurs antibiotic started, low suspicion that symptoms are being caused by infection due to time month they have been present, 6 weeks out you should be much sicker  Back pain is most likely muscular, no spinal tenderness on exam  Headache I believe it is related to the sinuses and allergies, continue daily allergy medicine as directed  You have been given an injection of Dron to help reduce inflammation and pain, ideally should see improvement within the hour  Starting tomorrow take prednisone  every morning with food as directed  May use muscle relaxant at bedtime for additional comfort, be mindful this can make you drowsy therefore take this or your Ambien   Please schedule follow-up appointment with your gynecologist as you are now symptomatic and they need to further evaluate your pelvic floor and urinary symptoms  Please keep upcoming appointment with your primary doctor  You may use heating pad in 15 minute intervals as needed for additional comfort  Begin stretching affected area daily for 10 minutes as tolerated to further loosen muscles   When lying down place pillow underneath and between knees for support  Can try sleeping without pillow on firm mattress   Practice good posture: head back, shoulders back, chest forward, pelvis back and weight distributed evenly on both legs  If pain persist after recommended treatment or reoccurs if may be beneficial to follow up with orthopedic specialist for evaluation, this doctor specializes in the bones and can manage your symptoms long-term with options such as but not limited to imaging, medications or physical therapy

## 2023-11-11 NOTE — ED Provider Notes (Signed)
 Arlander Bellman    CSN: [id number] Arrival date & time: 11/10/23  1900      History   Chief Complaint Chief Complaint  Patient presents with   Hypertension   Urinary Tract Infection    HPI Chelsea Wheeler is a 63 y.o. female.   She presents for evaluation of mid back pain, urinary frequency and headaches present for 6 weeks.  Headaches are to the frontal aspect described as aching, pressure and throbbing, have been occurring almost daily affected by noise and light.  Back pain is to the mid section exacerbated by movement, limitations on twisting turning and bending due to the pain elicited.  Denies numbness, tingling, urinary or bowel incontinence denies injury or trauma.  Denies hematuria, dysuria, abdominal pain, fever or vaginal symptoms.  Has attempted use of Tylenol  for management.  Endorses a history of seasonal allergies that have been affecting the ears COVID with fullness, takes antihistamine, Flonase daily.  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) 04/03/2019   Hypertension 12/26/2018   Trigger finger of left thumb 08/25/2018   Osteoporosis 06/24/2018   Carpal tunnel syndrome, left upper limb 11/23/2017   Unilateral primary osteoarthritis, left knee 10/14/2016   Osteoarthritis of right knee 09/06/2015   Status post total right knee replacement 09/06/2015   Cough 09/14/2012    Past Surgical History:  Procedure Laterality Date    CORRECTION HAMMER TOE Left 06/29/2019   ENDOMETRIAL ABLATION  2007   KNEE ARTHROSCOPY Right 03/22/2015   Procedure: ARTHROSCOPY KNEE, partial lateral meniscectomy, microfracture, chondraplasty;  Surgeon: Josephus Nida, MD;  Location: [facility];  Service: Orthopedics;  Laterality: Right;   TOTAL KNEE ARTHROPLASTY Right 09/06/2015   Procedure: RIGHT TOTAL KNEE ARTHROPLASTY;  Surgeon: Arnie Lao, MD;  Location: [facility];  Service: Orthopedics;  Laterality: Right;   TUBAL LIGATION  08-31-06    OB History   No obstetric history on file.      Home Medications    Prior to Admission medications   Medication Sig Start Date End Date Taking? Authorizing Provider  baclofen (LIORESAL) 10 MG tablet Take 1 tablet (10 mg total) by mouth at bedtime as needed for muscle spasms. 11/10/23  Yes Taylon Louison R, NP  predniSONE  (STERAPRED UNI-PAK 21 TAB) 10 MG (21) TBPK tablet Take by mouth daily. Take 6 tabs by mouth daily  for 1 days, then 5 tabs for 1 days, then 4 tabs for 1 days, then 3 tabs for 1 days, 2 tabs for 1 days, then 1 tab by mouth daily for 1 days 11/10/23  Yes Tal Neer R, NP  albuterol (VENTOLIN HFA) 108 (90 Base) MCG/ACT inhaler INHALE 2 PUFFS BY MOUTH EVERY 6 HOURS AS NEEDED FOR WHEEZING 02/02/19   [provider]  Azelastine-Fluticasone 137-50 MCG/ACT SUSP USE 1 SPRAY IN EACH NOSTRIL TWICE DAILY 07/25/19   [provider]  celecoxib  (CELEBREX ) 200 MG capsule Take 1 capsule (200 mg total) by mouth  2 (two) times daily between meals as needed. 12/07/22   Arnie Lao, MD  cetirizine (ZYRTEC) 10 MG tablet Take 10 mg by mouth daily.     [provider]  Cholecalciferol (VITAMIN D3 PO) Take 1 tablet by mouth daily.     [provider]  ciprofloxacin  (CIPRO ) 500 MG tablet Take 1 tablet (500 mg total) by mouth 2 (two) times daily. 01/06/23   Immordino, Mara Semin[place], FNP  diclofenac  sodium (VOLTAREN ) 1 % GEL Apply 2 g topically 4 (four) times daily.  11/23/17   Arnie Lao, MD  esomeprazole (NEXIUM) 40 MG capsule Take 40 mg by mouth every morning.     [provider]  fluticasone (FLONASE) 50 MCG/ACT nasal spray Place 1 spray into both nostrils daily as needed for allergies or rhinitis.     [provider]  hydrochlorothiazide  (HYDRODIURIL ) 25 MG tablet Take 25 mg by mouth every morning.     [provider]  Ketoconazole  2 % FOAM Apply fingertip amount to space between toes 04/16/17   Camilo Cella, DPM  KLOR-CON  M20 20 MEQ tablet  03/21/17   [provider]  losartan (COZAAR) 50 MG tablet Take 100 mg by mouth daily. 11/15/18   [provider]  montelukast (SINGULAIR) 10 MG tablet  03/17/17   [provider]  Multiple Vitamins-Minerals (ANTIOXIDANT FORMULA SG) capsule Take 1 capsule by mouth daily.    [provider]  potassium chloride  (K-DUR) 10 MEQ tablet Take 10 mEq by mouth 2 (two) times daily.    [provider]  traMADol  (ULTRAM ) 50 MG tablet Take 1 tablet (50 mg total) by mouth 3 (three) times daily as needed. 09/08/21   Bronson Canny, PA-C  zolpidem  (AMBIEN  CR) 12.5 MG CR tablet Take 12.5 mg by mouth at bedtime as needed for sleep.    [provider]    Family History Family History  Problem Relation Age of Onset   Allergies Sister     Social History Social History   Tobacco Use   Smoking status: Never   Smokeless tobacco: Never  Substance Use Topics   Alcohol use: No   Drug use: No     Allergies   Shellfish allergy, Clindamycin /lincomycin, Codeine, Percocet [oxycodone -acetaminophen ], Tylenol  with codeine #3 [acetaminophen -codeine], Lodine [etodolac], Naproxen, and Penicillins   Review of Systems Review of Systems   Physical Exam Triage Vital Signs ED Triage Vitals  Encounter Vitals Group     BP 11/10/23 1945 130/79     Systolic BP Percentile --      Diastolic BP Percentile --      Pulse Rate 11/10/23 1945 69      Resp 11/10/23 1945 18     Temp 11/10/23 1945 98.2 F (36.8 C)     Temp Source 11/10/23 1945 Oral     SpO2 11/10/23 1945 96 %     Weight --      Height --      Head Circumference --      Peak Flow --      Pain Score 11/10/23 1944 9     Pain Loc --      Pain Education --      Exclude from Growth Chart --    No data found.  Updated Vital Signs BP 130/79 (BP Location: Left Arm)   Pulse 69   Temp 98.2 F (36.8 C) (Oral)   Resp 18   SpO2 96%   Visual Acuity Right Eye Distance:  Left Eye Distance:   Bilateral Distance:    Right Eye Near:   Left Eye Near:    Bilateral Near:     Physical Exam Constitutional:      Appearance: Normal appearance.  Eyes:     Extraocular Movements: Extraocular movements intact.     Conjunctiva/sclera: Conjunctivae normal.     Pupils: Pupils are equal, round, and reactive to light.  Pulmonary:     Effort: Pulmonary effort is normal.     Breath sounds: Normal breath sounds.  Abdominal:     General: Abdomen is flat. Bowel sounds are normal.     Palpations: Abdomen is soft.     Tenderness: There is abdominal tenderness in the suprapubic area. There is no right CVA tenderness or left CVA tenderness.  Genitourinary:    Comments: deferred Musculoskeletal:     Comments: Tenderness to the bilateral thoracic region of the back without ecchymosis swelling or deformity, pain elicited with range of motion, no spinal tenderness noted  Neurological:     General: No focal deficit present.     Mental Status: She is alert and oriented to person, place, and time. Mental status is at baseline.      UC Treatments / Results  Labs (all labs ordered are listed, but only abnormal results are displayed) Labs Reviewed  URINE CULTURE  POCT URINALYSIS DIP (MANUAL ENTRY)    EKG   Radiology No results found.  Procedures Procedures (including critical care time)  Medications Ordered in UC Medications  dexamethasone  (DECADRON ) injection 10 mg (10 mg  Intramuscular Given 11/10/23 2021)    Initial Impression / Assessment and Plan / UC Course  I have reviewed the triage vital signs and the nursing notes.  Pertinent labs & imaging results that were available during my care of the patient were reviewed by me and considered in my medical decision making (see chart for details).  Urinary frequency, bad headache, acute bilateral thoracic back pain  Do not believe at this time that symptoms are related, seems to be several symptoms coinciding, headache is most likely related to seasonal allergies and sinuses, advised to continue daily medications for management, Decadron  IM given today for treatment, recommended continue use of over-the-counter Tylenol  with low stimulation activity when present, urinalysis is negative low suspicion for bladder infection due to timeline of symptoms, sent to culture for confirmation, will initiate antibiotics based on results, discussed with patient, back pain appears to be muscular, no spinal tenderness or signs of saddle anesthesia therefore imaging deferred, Decadron  IM given for treatment, prescribed prednisone  and baclofen for home use, does take daily Ambien  advised patient to take either or medication, has PCP appointment in 1 week, advised to keep for further evaluation and management, for urinary symptoms patient to reach out to her gynecologist for follow-up, verbalized understanding Final Clinical Impressions(s) / UC Diagnoses   Final diagnoses:  Urinary frequency   Discharge Instructions      \This time I do not believe any of your symptoms are related  Urinalysis is negative for bladder infection, has been sent to the lab to see if bacteria will grow, you will be notified if this occurs antibiotic started, low suspicion that symptoms are being caused by infection due to time month they have been present, 6 weeks out you should be much sicker  Back pain is most likely muscular, no spinal tenderness on  exam  Headache I believe it is related to the sinuses and allergies, continue daily allergy medicine as  directed  You have been given an injection of Dron to help reduce inflammation and pain, ideally should see improvement within the hour  Starting tomorrow take prednisone  every morning with food as directed  May use muscle relaxant at bedtime for additional comfort, be mindful this can make you drowsy therefore take this or your Ambien   Please schedule follow-up appointment with your gynecologist as you are now symptomatic and they need to further evaluate your pelvic floor and urinary symptoms  Please keep upcoming appointment with your primary doctor  You may use heating pad in 15 minute intervals as needed for additional comfort  Begin stretching affected area daily for 10 minutes as tolerated to further loosen muscles   When lying down place pillow underneath and between knees for support  Can try sleeping without pillow on firm mattress   Practice good posture: head back, shoulders back, chest forward, pelvis back and weight distributed evenly on both legs  If pain persist after recommended treatment or reoccurs if may be beneficial to follow up with orthopedic specialist for evaluation, this doctor specializes in the bones and can manage your symptoms long-term with options such as but not limited to imaging, medications or physical therapy     ED Prescriptions     Medication Sig Dispense Auth. Provider   predniSONE  (STERAPRED UNI-PAK 21 TAB) 10 MG (21) TBPK tablet Take by mouth daily. Take 6 tabs by mouth daily  for 1 days, then 5 tabs for 1 days, then 4 tabs for 1 days, then 3 tabs for 1 days, 2 tabs for 1 days, then 1 tab by mouth daily for 1 days 21 tablet Pat Elicker R, NP   baclofen (LIORESAL) 10 MG tablet Take 1 tablet (10 mg total) by mouth at bedtime as needed for muscle spasms. 30 each Reena Canning, NP      PDMP not reviewed this encounter.   Reena Canning, NP 11/11/23 [id number]

## 2023-11-12 ENCOUNTER — Ambulatory Visit (HOSPITAL_COMMUNITY): Payer: Self-pay

## 2023-11-12 LAB — URINE CULTURE: Culture: <10000 — AB

## 2023-12-01 ENCOUNTER — Other Ambulatory Visit: Payer: Self-pay | Admitting: Obstetrics and Gynecology

## 2023-12-01 DIAGNOSIS — R928 Other abnormal and inconclusive findings on diagnostic imaging of breast: Secondary | ICD-10-CM

## 2023-12-06 ENCOUNTER — Other Ambulatory Visit: Payer: Self-pay | Admitting: Obstetrics and Gynecology

## 2023-12-06 ENCOUNTER — Ambulatory Visit
Admission: RE | Admit: 2023-12-06 | Discharge: 2023-12-06 | Disposition: A | Discharge status: Home / Self Care | Source: Ambulatory Visit | Attending: Obstetrics and Gynecology | Admitting: Obstetrics and Gynecology

## 2023-12-06 DIAGNOSIS — R928 Other abnormal and inconclusive findings on diagnostic imaging of breast: Secondary | ICD-10-CM

## 2023-12-06 DIAGNOSIS — N6320 Unspecified lump in the left breast, unspecified quadrant: Secondary | ICD-10-CM

## 2023-12-10 ENCOUNTER — Ambulatory Visit
Admission: RE | Admit: 2023-12-10 | Discharge: 2023-12-10 | Disposition: A | Discharge status: Home / Self Care | Source: Ambulatory Visit | Attending: Obstetrics and Gynecology | Admitting: Obstetrics and Gynecology

## 2023-12-10 DIAGNOSIS — N6320 Unspecified lump in the left breast, unspecified quadrant: Secondary | ICD-10-CM

## 2023-12-10 HISTORY — PX: BREAST BIOPSY: SHX20

## 2023-12-13 ENCOUNTER — Inpatient Hospital Stay: Admission: RE | Admit: 2023-12-13 | Source: Ambulatory Visit

## 2023-12-13 LAB — SURGICAL PATHOLOGY

## 2024-01-20 ENCOUNTER — Other Ambulatory Visit: Payer: Self-pay | Admitting: Internal Medicine

## 2024-01-20 DIAGNOSIS — R1011 Right upper quadrant pain: Secondary | ICD-10-CM

## 2024-01-21 ENCOUNTER — Other Ambulatory Visit: Payer: Self-pay | Admitting: Internal Medicine

## 2024-01-21 DIAGNOSIS — R1011 Right upper quadrant pain: Secondary | ICD-10-CM

## 2024-01-31 ENCOUNTER — Ambulatory Visit: Payer: Self-pay

## 2024-01-31 DIAGNOSIS — Z860101 Personal history of adenomatous and serrated colon polyps: Secondary | ICD-10-CM | POA: Diagnosis not present

## 2024-01-31 DIAGNOSIS — R1011 Right upper quadrant pain: Secondary | ICD-10-CM | POA: Diagnosis not present

## 2024-01-31 DIAGNOSIS — K219 Gastro-esophageal reflux disease without esophagitis: Secondary | ICD-10-CM | POA: Diagnosis not present

## 2024-01-31 DIAGNOSIS — Z1211 Encounter for screening for malignant neoplasm of colon: Secondary | ICD-10-CM | POA: Diagnosis present

## 2024-01-31 DIAGNOSIS — D125 Benign neoplasm of sigmoid colon: Secondary | ICD-10-CM | POA: Diagnosis not present

## 2024-01-31 DIAGNOSIS — K5730 Diverticulosis of large intestine without perforation or abscess without bleeding: Secondary | ICD-10-CM | POA: Diagnosis not present

## 2024-01-31 DIAGNOSIS — Z8711 Personal history of peptic ulcer disease: Secondary | ICD-10-CM | POA: Diagnosis not present

## 2024-01-31 DIAGNOSIS — K640 First degree hemorrhoids: Secondary | ICD-10-CM | POA: Diagnosis not present

## 2024-01-31 DIAGNOSIS — R6881 Early satiety: Secondary | ICD-10-CM | POA: Diagnosis not present

## 2024-02-07 ENCOUNTER — Ambulatory Visit
Admission: RE | Admit: 2024-02-07 | Discharge: 2024-02-07 | Disposition: A | Discharge status: Home / Self Care | Source: Ambulatory Visit | Attending: Internal Medicine | Admitting: Internal Medicine

## 2024-02-07 DIAGNOSIS — R1011 Right upper quadrant pain: Secondary | ICD-10-CM | POA: Diagnosis present

## 2024-02-15 ENCOUNTER — Ambulatory Visit: Payer: Self-pay | Admitting: Surgery

## 2024-02-15 NOTE — H&P (Signed)
 Subjective:    CC: Gallstones [K80.20]   HPI:  referred by Ladell Francis Boss, MD for evaluation of above CC.    History of Present Illness Chelsea Wheeler is a 63 year old female who presents with right upper quadrant pain.   She experiences right upper quadrant pain both before and after eating, sometimes accompanied by epigastric pain, early satiety, and night sweats. An ultrasound confirmed the presence of gallstones. She uses ibuprofen and occasional excederin for pain management. A recent endoscopy showed a stable, non-erosive ulcer.     Past Medical History:  has a past medical history of Allergic state, Chronic headaches, Generalized anxiety disorder, GERD (gastroesophageal reflux disease), History of ulcer disease, Hypertension, and Insomnia.   Past Surgical History:  has a past surgical history that includes Bunionette excision (Bilateral); Fibroids removed at [facility] in 1997; Colonoscopy (11/2013); arthroscopic partial lateral meniscectomy along with debridement and microfracture of the medial femoral chondral lesion  (Right, 03/22/2015); COLON@[facility]  (01/31/2024); and EGD@[facility]  (01/31/2024).   Family History: family history includes Diabetes type II in her father and mother; High blood pressure (Hypertension) in her father and mother; Myocardial Infarction (Heart attack) in her father; Stroke (age of onset: 60) in her father.   Social History:  reports that she has never smoked. She has never used smokeless tobacco. She reports that she does not drink alcohol and does not use drugs.   Current Medications: has a current medication list which includes the following prescription(s): albuterol mdi (proventil, ventolin, proair) hfa, epipen 2-pak, esomeprazole, famotidine , fluticasone propionate, hydrochlorothiazide , losartan, multivitamin, potassium chloride , zolpidem , albuterol, and sodium, potassium, and magnesium.   Allergies:       Allergies as of 02/15/2024 - Reviewed  02/15/2024  Allergen Reaction Noted   Shellfish containing products Anaphylaxis 11/06/2013   Acetaminophen -codeine Unknown 03/20/2015   Clindamycin  Nausea 06/29/2019   Codeine Hallucination and Other (See Comments) 07/06/2019   Oxycodone -acetaminophen  Other (See Comments) 06/29/2019   Lodine [etodolac] Other (See Comments) and Rash 11/06/2013   Naproxen Other (See Comments) and Rash 11/06/2013   Penicillins Rash 11/06/2013      ROS:  A 15 point review of systems was performed and pertinent positives and negatives noted in HPI    Objective:    Objective BP 134/80   Pulse 80   Ht 170.2 cm (5' 7)   Wt 87.1 kg (192 lb)   LMP  (LMP Unknown)   BMI 30.07 kg/m      Constitutional :  No distress, cooperative, alert  Lymphatics/Throat:  Supple with no lymphadenopathy  Respiratory:  Clear to auscultation bilaterally  Cardiovascular:  Regular rate and rhythm  Gastrointestinal: Soft, non-tender, non-distended, no organomegaly.  Musculoskeletal: Steady gait and movement  Skin: Cool and moist  Psychiatric: Normal affect, non-agitated, not confused         LABS:  N/a    RADS: CLINICAL DATA:  Right upper quadrant abdomen pain for several  months.   EXAM:  ULTRASOUND ABDOMEN LIMITED RIGHT UPPER QUADRANT   COMPARISON:  CT abdomen pelvis September 11, 2011, MRI September 19, 2011   FINDINGS:  Gallbladder:   Gallstones at noted in the gallbladder with wall echo shadow sign.  No wall thickening visualized. No sonographic Murphy sign noted by  sonographer.   Common bile duct:   Diameter: 2.1 mm.   Liver:   There is a hyperechoic mass in the caudate liver measuring 2 x 2.1 x  2.2 cm. This correlates to previously noted hemangioma in the  caudate liver on prior MRI. Within normal limits in parenchymal  echogenicity. Portal vein is patent on color Doppler imaging with  normal direction of blood flow towards the liver.   Other: None.   IMPRESSION:  1. Cholelithiasis without  sonographic evidence of acute  cholecystitis.  2. Hyperechoic mass in the caudate liver measuring 2 x 2.1 x 2.2 cm.  This correlates to previously noted hemangioma in the caudate liver  on prior MRI.    Electronically Signed    By: Craig Farr M.D.    On: 02/10/2024 13:19  Assessment:    Assessment Gallstones [K80.20]- stomach workup completed and pt still symptomatic, so will proceed with surgery.   Also discussed avoiding NSAIDS and aspirin to decrease irritation to known stomach ulcer.   Plan:    Plan 1. Gallstones [K80.20] Discussed the risk of surgery including post-op infxn, seroma, biloma, chronic pain, poor-delayed wound healing, retained gallstone, conversion to open procedure, post-op SBO or ileus, and need for additional procedures to address said risks.  The risks of general anesthetic including MI, CVA, sudden death or even reaction to anesthetic medications also discussed. Alternatives include continued observation.  Benefits include possible symptom relief, prevention of complications including acute cholecystitis, pancreatitis.   Typical post operative recovery of 3-5 days rest, continued pain in area and incision sites, possible loose stools up to 4-6 weeks, also discussed.   ED return precautions given for sudden increase in RUQ pain, with possible accompanying fever, nausea, and/or vomiting.   The patient understands the risks, any and all questions were answered to the patient's satisfaction.   2. Patient has elected to proceed with surgical treatment. Procedure will be scheduled.  Written consent was obtained..robotic assisted laparoscopic cholecystectomy [phone]   labs/images/medications/previous chart entries reviewed personally and relevant changes/updates noted above.

## 2024-02-15 NOTE — H&P (View-Only) (Signed)
 Subjective:    CC: Gallstones [K80.20]   HPI:  referred by Ladell Francis Boss, MD for evaluation of above CC.    History of Present Illness Chelsea Wheeler is a 63 year old female who presents with right upper quadrant pain.   She experiences right upper quadrant pain both before and after eating, sometimes accompanied by epigastric pain, early satiety, and night sweats. An ultrasound confirmed the presence of gallstones. She uses ibuprofen and occasional excederin for pain management. A recent endoscopy showed a stable, non-erosive ulcer.     Past Medical History:  has a past medical history of Allergic state, Chronic headaches, Generalized anxiety disorder, GERD (gastroesophageal reflux disease), History of ulcer disease, Hypertension, and Insomnia.   Past Surgical History:  has a past surgical history that includes Bunionette excision (Bilateral); Fibroids removed at [facility] in 1997; Colonoscopy (11/2013); arthroscopic partial lateral meniscectomy along with debridement and microfracture of the medial femoral chondral lesion  (Right, 03/22/2015); [facility]  (01/31/2024); and EGD@[facility]  (01/31/2024).   Family History: family history includes Diabetes type II in her father and mother; High blood pressure (Hypertension) in her father and mother; Myocardial Infarction (Heart attack) in her father; Stroke (age of onset: 60) in her father.   Social History:  reports that she has never smoked. She has never used smokeless tobacco. She reports that she does not drink alcohol and does not use drugs.   Current Medications: has a current medication list which includes the following prescription(s): albuterol mdi (proventil, ventolin, proair) hfa, epipen 2-pak, esomeprazole, famotidine , fluticasone propionate, hydrochlorothiazide , losartan, multivitamin, potassium chloride , zolpidem , albuterol, and sodium, potassium, and magnesium.   Allergies:       Allergies as of 02/15/2024 - Reviewed  02/15/2024  Allergen Reaction Noted   Shellfish containing products Anaphylaxis 11/06/2013   Acetaminophen -codeine Unknown 03/20/2015   Clindamycin  Nausea 06/29/2019   Codeine Hallucination and Other (See Comments) 07/06/2019   Oxycodone -acetaminophen  Other (See Comments) 06/29/2019   Lodine [etodolac] Other (See Comments) and Rash 11/06/2013   Naproxen Other (See Comments) and Rash 11/06/2013   Penicillins Rash 11/06/2013      ROS:  A 15 point review of systems was performed and pertinent positives and negatives noted in HPI    Objective:    Objective BP 134/80   Pulse 80   Ht 170.2 cm (5' 7)   Wt 87.1 kg (192 lb)   LMP  (LMP Unknown)   BMI 30.07 kg/m      Constitutional :  No distress, cooperative, alert  Lymphatics/Throat:  Supple with no lymphadenopathy  Respiratory:  Clear to auscultation bilaterally  Cardiovascular:  Regular rate and rhythm  Gastrointestinal: Soft, non-tender, non-distended, no organomegaly.  Musculoskeletal: Steady gait and movement  Skin: Cool and moist  Psychiatric: Normal affect, non-agitated, not confused         LABS:  N/a    RADS: CLINICAL DATA:  Right upper quadrant abdomen pain for several  months.   EXAM:  ULTRASOUND ABDOMEN LIMITED RIGHT UPPER QUADRANT   COMPARISON:  CT abdomen pelvis September 11, 2011, MRI September 19, 2011   FINDINGS:  Gallbladder:   Gallstones at noted in the gallbladder with wall echo shadow sign.  No wall thickening visualized. No sonographic Murphy sign noted by  sonographer.   Common bile duct:   Diameter: 2.1 mm.   Liver:   There is a hyperechoic mass in the caudate liver measuring 2 x 2.1 x  2.2 cm. This correlates to previously noted hemangioma in the  caudate liver on prior MRI. Within normal limits in parenchymal  echogenicity. Portal vein is patent on color Doppler imaging with  normal direction of blood flow towards the liver.   Other: None.   IMPRESSION:  1. Cholelithiasis without  sonographic evidence of acute  cholecystitis.  2. Hyperechoic mass in the caudate liver measuring 2 x 2.1 x 2.2 cm.  This correlates to previously noted hemangioma in the caudate liver  on prior MRI.    Electronically Signed    By: Craig Farr M.D.    On: 02/10/2024 13:19  Assessment:    Assessment Gallstones [K80.20]- stomach workup completed and pt still symptomatic, so will proceed with surgery.   Also discussed avoiding NSAIDS and aspirin to decrease irritation to known stomach ulcer.   Plan:    Plan 1. Gallstones [K80.20] Discussed the risk of surgery including post-op infxn, seroma, biloma, chronic pain, poor-delayed wound healing, retained gallstone, conversion to open procedure, post-op SBO or ileus, and need for additional procedures to address said risks.  The risks of general anesthetic including MI, CVA, sudden death or even reaction to anesthetic medications also discussed. Alternatives include continued observation.  Benefits include possible symptom relief, prevention of complications including acute cholecystitis, pancreatitis.   Typical post operative recovery of 3-5 days rest, continued pain in area and incision sites, possible loose stools up to 4-6 weeks, also discussed.   ED return precautions given for sudden increase in RUQ pain, with possible accompanying fever, nausea, and/or vomiting.   The patient understands the risks, any and all questions were answered to the patient's satisfaction.   2. Patient has elected to proceed with surgical treatment. Procedure will be scheduled.  Written consent was obtained..robotic assisted laparoscopic cholecystectomy [phone]   labs/images/medications/previous chart entries reviewed personally and relevant changes/updates noted above.

## 2024-02-17 ENCOUNTER — Encounter
Admission: RE | Admit: 2024-02-17 | Discharge: 2024-02-17 | Disposition: A | Discharge status: Home / Self Care | Source: Ambulatory Visit | Attending: Surgery | Admitting: Surgery

## 2024-02-17 ENCOUNTER — Other Ambulatory Visit: Payer: Self-pay

## 2024-02-17 VITALS — Ht 69.0 in | Wt 192.0 lb

## 2024-02-17 DIAGNOSIS — I10 Essential (primary) hypertension: Secondary | ICD-10-CM

## 2024-02-17 HISTORY — DX: Right upper quadrant pain: R10.11

## 2024-02-17 HISTORY — DX: Chronic pansinusitis: J32.4

## 2024-02-17 HISTORY — DX: Other chronic pain: G89.29

## 2024-02-17 HISTORY — DX: Gastro-esophageal reflux disease without esophagitis: K21.9

## 2024-02-17 HISTORY — DX: Frequency of micturition: R35.0

## 2024-02-17 HISTORY — DX: Anorexia: R63.0

## 2024-02-17 HISTORY — DX: Age-related osteoporosis without current pathological fracture: M81.0

## 2024-02-17 HISTORY — DX: Other fatigue: R53.83

## 2024-02-17 HISTORY — DX: Unspecified abdominal pain: R10.9

## 2024-02-17 HISTORY — DX: Essential (primary) hypertension: I10

## 2024-02-17 HISTORY — DX: Personal history of peptic ulcer disease: Z87.11

## 2024-02-17 HISTORY — DX: Insomnia, unspecified: G47.00

## 2024-02-17 HISTORY — DX: Reaction to severe stress, unspecified: F43.9

## 2024-02-17 HISTORY — DX: Abdominal distension (gaseous): R14.0

## 2024-02-17 NOTE — Progress Notes (Signed)
 Patient to call back to schedule EKG, not sure if will be able to come today or tomorrow. 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 She also needs soap and copy of instructions given.

## 2024-02-17 NOTE — Patient Instructions (Addendum)
 Your procedure is scheduled on: Friday 03/03/24 Report to the Registration Desk on the 1st floor of the [facility]. To find out your arrival time, please call [phone] between 1PM - 3PM on: Thursday 03/02/24 If your arrival time is 6:00 am, do not arrive before that time as the [facility] entrance doors do not open until 6:00 am.  REMEMBER: Instructions that are not followed completely may result in serious medical risk, up to and including death; or upon the discretion of your surgeon and anesthesiologist your surgery may need to be rescheduled.  Do not eat food after midnight the night before surgery.  No gum chewing or hard candies.  You may however, drink CLEAR liquids up to 2 hours before you are scheduled to arrive for your surgery. Do not drink anything within 2 hours of your scheduled arrival time.  Clear liquids include: - water  - apple juice without pulp - black coffee or tea (Do NOT add milk or creamers to the coffee or tea) Do NOT drink anything that is not on this list.  One week prior to surgery: Stop Anti-inflammatories (NSAIDS) such as Advil, Aleve, Ibuprofen, Motrin, Naproxen, Naprosyn and Aspirin based products such as Excedrin, Goody's Powder, BC Powder. Stop ANY OVER THE COUNTER supplements until after surgery. Multiple Vitamins-Minerals (ANTIOXIDANT FORMULA  Cholecalciferol (VITAMIN D3   You may however, continue to take Tylenol  if needed for pain up until the day of surgery.   Continue taking all of your other prescription medications up until the day of surgery.  ON THE DAY OF SURGERY ONLY TAKE THESE MEDICATIONS WITH SIPS OF WATER:  esomeprazole (NEXIUM) 40 MG    Use inhalers on the day of surgery and bring to the hospital. albuterol (VENTOLIN HFA) 108 (90 Base) MCG/ACT inhaler  Azelastine-Fluticasone 137-50 MCG/ACT SUSP    No Alcohol for 24 hours before or after surgery.  No Smoking including e-cigarettes for 24 hours before surgery.  No  chewable tobacco products for at least 6 hours before surgery.  No nicotine patches on the day of surgery.  Do not use any recreational drugs for at least a week (preferably 2 weeks) before your surgery.  Please be advised that the combination of cocaine and anesthesia may have negative outcomes, up to and including death. If you test positive for cocaine, your surgery will be cancelled.  On the morning of surgery brush your teeth with toothpaste and water, you may rinse your mouth with mouthwash if you wish. Do not swallow any toothpaste or mouthwash.  Use CHG Soap or wipes as directed on instruction sheet.  Do not wear jewelry, make-up, hairpins, clips or nail polish.  For welded (permanent) jewelry: bracelets, anklets, waist bands, etc.  Please have this removed prior to surgery.  If it is not removed, there is a chance that hospital personnel will need to cut it off on the day of surgery.  Do not wear lotions, powders, or perfumes.   Do not shave body hair from the neck down 48 hours before surgery.  Contact lenses, hearing aids and dentures may not be worn into surgery.  Do not bring valuables to the hospital. [facility] is not responsible for any missing/lost belongings or valuables.   Notify your doctor if there is any change in your medical condition (cold, fever, infection).  Wear comfortable clothing (specific to your surgery type) to the hospital.  After surgery, you can help prevent lung complications by doing breathing exercises.  Take deep breaths  and cough every 1-2 hours. Your doctor may order a device called an Incentive Spirometer to help you take deep breaths. When coughing or sneezing, hold a pillow firmly against your incision with both hands. This is called "splinting." Doing this helps protect your incision. It also decreases belly discomfort.  If you are being admitted to the hospital overnight, leave your suitcase in the car. After surgery it may be brought  to your room.  In case of increased patient census, it may be necessary for you, the patient, to continue your postoperative care in the Same Day Surgery department.  If you are being discharged the day of surgery, you will not be allowed to drive home. You will need a responsible individual to drive you home and stay with you for 24 hours after surgery.   If you are taking public transportation, you will need to have a responsible individual with you.  Please call the Pre-admissions Testing Dept. at [phone] if you have any questions about these instructions.  Surgery Visitation Policy:  Patients having surgery or a procedure may have two visitors.  Children under the age of 29 must have an adult with them who is not the patient.  Inpatient Visitation:    Visiting hours are 7 a.m. to 8 p.m. Up to four visitors are allowed at one time in a patient room. The visitors may rotate out with other people during the day.  One visitor age 29 or older may stay with the patient overnight and must be in the room by 8 p.m.   Merchandiser, retail to address health-related social needs:  [email or web address]    Preparing for Surgery with CHLORHEXIDINE  GLUCONATE (CHG) Soap  Chlorhexidine  Gluconate (CHG) Soap  o An antiseptic cleaner that kills germs and bonds with the skin to continue killing germs even after washing  o Used for showering the night before surgery and morning of surgery  Before surgery, you can play an important role by reducing the number of germs on your skin.  CHG (Chlorhexidine  gluconate) soap is an antiseptic cleanser which kills germs and bonds with the skin to continue killing germs even after washing.  Please do not use if you have an allergy to CHG or antibacterial soaps. If your skin becomes reddened/irritated stop using the CHG.  1. Shower the NIGHT BEFORE SURGERY and the MORNING OF SURGERY with CHG soap.  2. If you choose to wash your  hair, wash your hair first as usual with your normal shampoo.  3. After shampooing, rinse your hair and body thoroughly to remove the shampoo.  4. Use CHG as you would any other liquid soap. You can apply CHG directly to the skin and wash gently with a scrungie or a clean washcloth.  5. Apply the CHG soap to your body only from the neck down. Do not use on open wounds or open sores. Avoid contact with your eyes, ears, mouth, and genitals (private parts). Wash face and genitals (private parts) with your normal soap.  6. Wash thoroughly, paying special attention to the area where your surgery will be performed.  7. Thoroughly rinse your body with warm water.  8. Do not shower/wash with your normal soap after using and rinsing off the CHG soap.  9. Pat yourself dry with a clean towel.  10. Wear clean pajamas to bed the night before surgery.  12. Place clean sheets on your bed the night of your first shower and do not sleep with pets.  13. Shower again with the CHG soap on the day of surgery prior to arriving at the hospital.  14. Do not apply any deodorants/lotions/powders.  15. Please wear clean clothes to the hospital.

## 2024-02-18 ENCOUNTER — Encounter
Admission: RE | Admit: 2024-02-18 | Discharge: 2024-02-18 | Disposition: A | Discharge status: Home / Self Care | Source: Ambulatory Visit | Attending: Surgery | Admitting: Surgery

## 2024-02-18 DIAGNOSIS — R9431 Abnormal electrocardiogram [ECG] [EKG]: Secondary | ICD-10-CM | POA: Insufficient documentation

## 2024-02-18 DIAGNOSIS — Z01810 Encounter for preprocedural cardiovascular examination: Secondary | ICD-10-CM | POA: Insufficient documentation

## 2024-02-18 DIAGNOSIS — I10 Essential (primary) hypertension: Secondary | ICD-10-CM | POA: Insufficient documentation

## 2024-02-18 DIAGNOSIS — Z01818 Encounter for other preprocedural examination: Secondary | ICD-10-CM | POA: Diagnosis present

## 2024-03-03 ENCOUNTER — Other Ambulatory Visit: Payer: Self-pay

## 2024-03-03 ENCOUNTER — Ambulatory Visit: Payer: Self-pay | Admitting: Urgent Care

## 2024-03-03 ENCOUNTER — Encounter
Admission: RE | Disposition: A | Discharge status: Home / Self Care | Payer: Self-pay | Source: Home / Self Care | Attending: Surgery

## 2024-03-03 ENCOUNTER — Encounter: Payer: Self-pay | Admitting: Surgery

## 2024-03-03 ENCOUNTER — Ambulatory Visit: Payer: Self-pay

## 2024-03-03 ENCOUNTER — Ambulatory Visit
Admission: RE | Admit: 2024-03-03 | Discharge: 2024-03-03 | Disposition: A | Discharge status: Home / Self Care | Attending: Surgery | Admitting: Surgery

## 2024-03-03 DIAGNOSIS — K811 Chronic cholecystitis: Secondary | ICD-10-CM

## 2024-03-03 DIAGNOSIS — K219 Gastro-esophageal reflux disease without esophagitis: Secondary | ICD-10-CM | POA: Insufficient documentation

## 2024-03-03 DIAGNOSIS — J45909 Unspecified asthma, uncomplicated: Secondary | ICD-10-CM | POA: Diagnosis not present

## 2024-03-03 DIAGNOSIS — I10 Essential (primary) hypertension: Secondary | ICD-10-CM | POA: Insufficient documentation

## 2024-03-03 DIAGNOSIS — D1803 Hemangioma of intra-abdominal structures: Secondary | ICD-10-CM | POA: Insufficient documentation

## 2024-03-03 DIAGNOSIS — R519 Headache, unspecified: Secondary | ICD-10-CM | POA: Diagnosis not present

## 2024-03-03 DIAGNOSIS — K828 Other specified diseases of gallbladder: Secondary | ICD-10-CM | POA: Insufficient documentation

## 2024-03-03 DIAGNOSIS — Z79899 Other long term (current) drug therapy: Secondary | ICD-10-CM | POA: Diagnosis not present

## 2024-03-03 DIAGNOSIS — F419 Anxiety disorder, unspecified: Secondary | ICD-10-CM | POA: Diagnosis not present

## 2024-03-03 DIAGNOSIS — K8010 Calculus of gallbladder with chronic cholecystitis without obstruction: Secondary | ICD-10-CM | POA: Diagnosis present

## 2024-03-03 HISTORY — PX: INDOCYANINE GREEN FLUORESCENCE IMAGING (ICG): SHX7595

## 2024-03-03 SURGERY — CHOLECYSTECTOMY, ROBOT-ASSISTED, LAPAROSCOPIC
Anesthesia: General | Site: Abdomen

## 2024-03-03 MED ORDER — TRAMADOL HCL 50 MG PO TABS
50.0000 mg | ORAL_TABLET | Freq: Four times a day (QID) | ORAL | Status: DC | PRN
  Administered 2024-03-03: 50 mg via ORAL

## 2024-03-03 MED ORDER — DEXAMETHASONE SODIUM PHOSPHATE 10 MG/ML IJ SOLN
INTRAMUSCULAR | Status: DC | PRN
  Administered 2024-03-03: 10 mg via INTRAVENOUS

## 2024-03-03 MED ORDER — ACETAMINOPHEN 10 MG/ML IV SOLN
INTRAVENOUS | Status: DC | PRN
  Administered 2024-03-03: 1000 mg via INTRAVENOUS

## 2024-03-03 MED ORDER — FENTANYL CITRATE (PF) 100 MCG/2ML IJ SOLN
25.0000 ug | INTRAMUSCULAR | Status: DC | PRN
  Administered 2024-03-03 (×2): 25 ug via INTRAVENOUS
  Administered 2024-03-03: 50 ug via INTRAVENOUS

## 2024-03-03 MED ORDER — INDOCYANINE GREEN 25 MG IV SOLR
INTRAVENOUS | Status: AC
  Filled 2024-03-03: qty 10

## 2024-03-03 MED ORDER — DEXMEDETOMIDINE HCL IN NACL 80 MCG/20ML IV SOLN
INTRAVENOUS | Status: DC | PRN
  Administered 2024-03-03 (×2): 8 ug via INTRAVENOUS
  Administered 2024-03-03: 4 ug via INTRAVENOUS

## 2024-03-03 MED ORDER — CHLORHEXIDINE GLUCONATE 0.12 % MT SOLN
15.0000 mL | Freq: Once | OROMUCOSAL | Status: AC
  Administered 2024-03-03: 15 mL via OROMUCOSAL

## 2024-03-03 MED ORDER — ORAL CARE MOUTH RINSE: 15.0000 mL | Freq: Once | OROMUCOSAL | Status: AC

## 2024-03-03 MED ORDER — SUGAMMADEX SODIUM 200 MG/2ML IV SOLN
INTRAVENOUS | Status: DC | PRN
  Administered 2024-03-03: 200 mg via INTRAVENOUS

## 2024-03-03 MED ORDER — INDOCYANINE GREEN 25 MG IV SOLR
1.2500 mg | Freq: Once | INTRAVENOUS | Status: AC
  Administered 2024-03-03: 1.25 mg via INTRAVENOUS

## 2024-03-03 MED ORDER — DOCUSATE SODIUM 100 MG PO CAPS
100.0000 mg | ORAL_CAPSULE | Freq: Two times a day (BID) | ORAL | 0 refills | Status: AC | PRN
  Filled 2024-03-03: qty 20, 10d supply, fill #0

## 2024-03-03 MED ORDER — FENTANYL CITRATE (PF) 100 MCG/2ML IJ SOLN
INTRAMUSCULAR | Status: DC | PRN
  Administered 2024-03-03 (×2): 50 ug via INTRAVENOUS

## 2024-03-03 MED ORDER — KETOROLAC TROMETHAMINE 30 MG/ML IJ SOLN
INTRAMUSCULAR | Status: DC | PRN
  Administered 2024-03-03: 30 mg via INTRAVENOUS

## 2024-03-03 MED ORDER — TRAMADOL HCL 50 MG PO TABS
ORAL_TABLET | ORAL | Status: AC
Start: 2024-03-03 — End: 2024-03-03
  Filled 2024-03-03: qty 1

## 2024-03-03 MED ORDER — ROCURONIUM BROMIDE 100 MG/10ML IV SOLN
INTRAVENOUS | Status: DC | PRN
  Administered 2024-03-03: 10 mg via INTRAVENOUS
  Administered 2024-03-03: 50 mg via INTRAVENOUS

## 2024-03-03 MED ORDER — PROPOFOL 10 MG/ML IV BOLUS
INTRAVENOUS | Status: AC
  Filled 2024-03-03: qty 20

## 2024-03-03 MED ORDER — ACETAMINOPHEN 10 MG/ML IV SOLN
INTRAVENOUS | Status: AC
  Filled 2024-03-03: qty 100

## 2024-03-03 MED ORDER — CEFAZOLIN SODIUM-DEXTROSE 2-4 GM/100ML-% IV SOLN
INTRAVENOUS | Status: AC
  Filled 2024-03-03: qty 100

## 2024-03-03 MED ORDER — CEFAZOLIN SODIUM-DEXTROSE 2-4 GM/100ML-% IV SOLN
2.0000 g | INTRAVENOUS | Status: AC
  Administered 2024-03-03: 2 g via INTRAVENOUS

## 2024-03-03 MED ORDER — LACTATED RINGERS IV SOLN: INTRAVENOUS | Status: DC

## 2024-03-03 MED ORDER — CHLORHEXIDINE GLUCONATE CLOTH 2 % EX PADS
6.0000 | MEDICATED_PAD | Freq: Once | CUTANEOUS | Status: AC
  Administered 2024-03-03: 6 via TOPICAL

## 2024-03-03 MED ORDER — ONDANSETRON HCL 4 MG/2ML IJ SOLN
INTRAMUSCULAR | Status: DC | PRN
  Administered 2024-03-03: 4 mg via INTRAVENOUS

## 2024-03-03 MED ORDER — CHLORHEXIDINE GLUCONATE 0.12 % MT SOLN
OROMUCOSAL | Status: AC
  Filled 2024-03-03: qty 15

## 2024-03-03 MED ORDER — BUPIVACAINE HCL (PF) 0.5 % IJ SOLN
INTRAMUSCULAR | Status: AC
  Filled 2024-03-03: qty 30

## 2024-03-03 MED ORDER — PROPOFOL 10 MG/ML IV BOLUS
INTRAVENOUS | Status: DC | PRN
  Administered 2024-03-03: 120 mg via INTRAVENOUS

## 2024-03-03 MED ORDER — MIDAZOLAM HCL 2 MG/2ML IJ SOLN
INTRAMUSCULAR | Status: AC
Start: 2024-03-03 — End: 2024-03-03
  Filled 2024-03-03: qty 2

## 2024-03-03 MED ORDER — FENTANYL CITRATE (PF) 100 MCG/2ML IJ SOLN
INTRAMUSCULAR | Status: AC
  Filled 2024-03-03: qty 2

## 2024-03-03 MED ORDER — BUPIVACAINE HCL 0.5 % IJ SOLN
INTRAMUSCULAR | Status: DC | PRN
  Administered 2024-03-03: 50 mL via INTRAMUSCULAR

## 2024-03-03 MED ORDER — LIDOCAINE HCL (CARDIAC) PF 100 MG/5ML IV SOSY
PREFILLED_SYRINGE | INTRAVENOUS | Status: DC | PRN
  Administered 2024-03-03: 100 mg via INTRAVENOUS

## 2024-03-03 MED ORDER — MIDAZOLAM HCL 5 MG/5ML IJ SOLN
INTRAMUSCULAR | Status: DC | PRN
  Administered 2024-03-03: 2 mg via INTRAVENOUS

## 2024-03-03 MED ORDER — TRAMADOL HCL 50 MG PO TABS
50.0000 mg | ORAL_TABLET | Freq: Three times a day (TID) | ORAL | 0 refills | Status: AC | PRN
  Filled 2024-03-03: qty 6, 2d supply, fill #0

## 2024-03-03 MED ORDER — ACETAMINOPHEN 325 MG PO TABS
650.0000 mg | ORAL_TABLET | Freq: Three times a day (TID) | ORAL | 0 refills | Status: AC | PRN
  Filled 2024-03-03: qty 40, 7d supply, fill #0

## 2024-03-03 MED ORDER — LIDOCAINE-EPINEPHRINE (PF) 1 %-1:200000 IJ SOLN
INTRAMUSCULAR | Status: AC
  Filled 2024-03-03: qty 30

## 2024-03-03 SURGICAL SUPPLY — 39 items
ANCHOR TIS RET SYS 235ML (MISCELLANEOUS) ×2 IMPLANT
BAG PRESSURE INF REUSE 1000 (BAG) IMPLANT
CATH REDDICK CHOLANGI 4FR 50CM (CATHETERS) IMPLANT
CAUTERY HOOK MNPLR 1.6 DVNC XI (INSTRUMENTS) ×2 IMPLANT
CLIP LIGATING HEMO O LOK GREEN (MISCELLANEOUS) ×2 IMPLANT
DEFOGGER SCOPE WARM SEASHARP (MISCELLANEOUS) ×2 IMPLANT
DERMABOND ADVANCED .7 DNX12 (GAUZE/BANDAGES/DRESSINGS) ×2 IMPLANT
DRAPE ARM DVNC X/XI (DISPOSABLE) ×8 IMPLANT
DRAPE C-ARM XRAY 36X54 (DRAPES) IMPLANT
DRAPE COLUMN DVNC XI (DISPOSABLE) ×2 IMPLANT
ELECTRODE REM PT RTRN 9FT ADLT (ELECTROSURGICAL) ×2 IMPLANT
FORCEPS BPLR FENES DVNC XI (FORCEP) ×2 IMPLANT
FORCEPS PROGRASP DVNC XI (FORCEP) ×2 IMPLANT
GLOVE BIOGEL PI IND STRL 7.0 (GLOVE) ×4 IMPLANT
GLOVE SURG SYN 6.5 PF PI (GLOVE) ×8 IMPLANT
GOWN STRL REUS W/ TWL LRG LVL3 (GOWN DISPOSABLE) ×8 IMPLANT
GRASPER SUT TROCAR 14GX15 (MISCELLANEOUS) IMPLANT
IRRIGATOR SUCT 8 DISP DVNC XI (IRRIGATION / IRRIGATOR) IMPLANT
IV NS 1000ML BAXH (IV SOLUTION) IMPLANT
KIT TURNOVER KIT A (KITS) ×2 IMPLANT
LABEL OR SOLS (LABEL) ×2 IMPLANT
MANIFOLD NEPTUNE II (INSTRUMENTS) ×2 IMPLANT
NDL HYPO 22X1.5 SAFETY MO (MISCELLANEOUS) ×2 IMPLANT
NDL INSUFFLATION 14GA 120MM (NEEDLE) ×2 IMPLANT
NEEDLE HYPO 22X1.5 SAFETY MO (MISCELLANEOUS) ×2 IMPLANT
NEEDLE INSUFFLATION 14GA 120MM (NEEDLE) ×2 IMPLANT
NS IRRIG 500ML POUR BTL (IV SOLUTION) ×2 IMPLANT
OBTURATOR OPTICALSTD 8 DVNC (TROCAR) ×2 IMPLANT
PACK LAP CHOLECYSTECTOMY (MISCELLANEOUS) ×2 IMPLANT
SEAL UNIV 5-12 XI (MISCELLANEOUS) ×8 IMPLANT
SET TUBE SMOKE EVAC HIGH FLOW (TUBING) ×2 IMPLANT
SOLUTION ELECTROSURG ANTI STCK (MISCELLANEOUS) ×2 IMPLANT
SPIKE FLUID TRANSFER (MISCELLANEOUS) ×4 IMPLANT
SUT VICRYL 0 UR6 27IN ABS (SUTURE) ×2 IMPLANT
SUTURE MNCRL 4-0 27XMF (SUTURE) ×4 IMPLANT
SYR 30ML LL (SYRINGE) IMPLANT
SYSTEM WECK SHIELD CLOSURE (TROCAR) IMPLANT
TRAP FLUID SMOKE EVACUATOR (MISCELLANEOUS) ×2 IMPLANT
WATER STERILE IRR 500ML POUR (IV SOLUTION) ×2 IMPLANT

## 2024-03-03 NOTE — Anesthesia Preprocedure Evaluation (Addendum)
 Anesthesia Evaluation  Patient identified by MRN, date of birth, ID band Patient awake    Reviewed: Allergy & Precautions, NPO status , Patient's Chart, lab work & pertinent test results  History of Anesthesia Complications Negative for: history of anesthetic complications  Airway Mallampati: III  TM Distance: >3 FB Neck ROM: full    Dental  (+) Edentulous Lower, Partial Upper   Pulmonary asthma    Pulmonary exam normal        Cardiovascular hypertension, On Medications Normal cardiovascular exam     Neuro/Psych  Headaches PSYCHIATRIC DISORDERS Anxiety      Neuromuscular disease    GI/Hepatic Neg liver ROS,GERD  Medicated,,  Endo/Other  negative endocrine ROS    Renal/GU negative Renal ROS     Musculoskeletal   Abdominal   Peds  Hematology negative hematology ROS (+)   Anesthesia Other Findings Past Medical History: No date: Abdominal pain of unknown etiology No date: Abdominal pain, RUQ (right upper quadrant) No date: Age-related osteoporosis without current pathological  fracture No date: Arthritis     Comment:  knee No date: Bloated abdomen No date: Chronic nonintractable headache, unspecified headache type No date: Chronic pansinusitis No date: Claustrophobia No date: Decreased appetite No date: Fatigue, unspecified type No date: Gastroesophageal reflux disease, unspecified whether  esophagitis present No date: GERD (gastroesophageal reflux disease) No date: History of peptic ulcer No date: Hypertension No date: Insomnia, unspecified type No date: Primary hypertension No date: Seasonal allergies No date: Stress No date: Urinary frequency  Past Surgical History: 12/10/2023: BREAST BIOPSY; Left     Comment:  US  LT BREAST BX W LOC DEV 1ST LESION IMG BX SPEC US                GUIDE 12/10/2023 [facility] MAMMOGRAPHY 06/29/2019: CORRECTION HAMMER TOE; Left 2007: ENDOMETRIAL ABLATION 03/22/2015: KNEE  ARTHROSCOPY; Right     Comment:  Procedure: ARTHROSCOPY KNEE, partial lateral               meniscectomy, microfracture, chondraplasty;  Surgeon:               Helayne Glenn, MD;  Location: [facility] ORS;  Service:               Orthopedics;  Laterality: Right; 09/06/2015: TOTAL KNEE ARTHROPLASTY; Right     Comment:  Procedure: RIGHT TOTAL KNEE ARTHROPLASTY;  Surgeon:               Lonni CINDERELLA Poli, MD;  Location: [facility];  Service:               Orthopedics;  Laterality: Right; 08-31-06: TUBAL LIGATION  BMI    Body Mass Index: 28.21 kg/m      Reproductive/Obstetrics negative OB ROS                              Anesthesia Physical Anesthesia Plan  ASA: 2  Anesthesia Plan: General ETT   Post-op Pain Management: Toradol  IV (intra-op)*, Ofirmev  IV (intra-op)* and Dilaudid  IV   Induction: Intravenous  PONV Risk Score and Plan: 3 and Ondansetron , Dexamethasone , Midazolam  and Treatment may vary due to age or medical condition  Airway Management Planned: Oral ETT  Additional Equipment:   Intra-op Plan:   Post-operative Plan: Extubation in OR  Informed Consent: I have reviewed the patients History and Physical, chart, labs and discussed the procedure including the risks, benefits and alternatives for the proposed anesthesia with the  patient or authorized representative who has indicated his/her understanding and acceptance.     Dental Advisory Given  Plan Discussed with: Anesthesiologist, CRNA and Surgeon  Anesthesia Plan Comments: (Patient consented for risks of anesthesia including but not limited to:  - adverse reactions to medications - damage to eyes, teeth, lips or other oral mucosa - nerve damage due to positioning  - sore throat or hoarseness - Damage to heart, brain, nerves, lungs, other parts of body or loss of life  Patient voiced understanding and assent.)         Anesthesia Quick Evaluation

## 2024-03-03 NOTE — Op Note (Signed)
 Preoperative diagnosis:  chronic and cholecystitis  Postoperative diagnosis: same as above  Procedure: Robotic assisted Laparoscopic Cholecystectomy.   Anesthesia: GETA   Surgeon: Henriette Pierre  Specimen: Gallbladder  Complications: None  EBL: 15mL  Wound Classification: Clean Contaminated  Indications: see HPI  Findings: Critical view of safety noted Cystic duct and artery identified, ligated and divided, clips remained intact at end of procedure Adequate hemostasis  Description of procedure:  The patient was placed on the operating table in the supine position. SCDs placed, pre-op abx administered.  General anesthesia was induced and OG tube placed by anesthesia. A time-out was completed verifying correct patient, procedure, site, positioning, and implant(s) and/or special equipment prior to beginning this procedure. The abdomen was prepped and draped in the usual sterile fashion.    Veress needle was placed at the Palmer's point and insufflation was started after confirming a positive saline drop test and no immediate increase in abdominal pressure.  After reaching 15 mm, the Veress needle was removed and a 8 mm port was placed via optiview technique under umbilicus measured 20mm from gallbladder.  The abdomen was inspected and no abnormalities or injuries were found.  Under direct vision, ports were placed in the following locations: One 12 mm patient left of the umbilicus, 8cm from the periumbilical port, one 8 mm port placed to the patient right of the umbilical port 8 cm apart.  1 additional 8 mm port placed lateral to the 12mm port.  Once ports were placed, The table was placed in the reverse Trendelenburg position with the right side up. The Xi platform was brought into the operative field and docked to the ports successfully.  An endoscope was placed through the umbilical port, fenestrated grasper through the adjacent patient right port, prograsp to the far patient left port, and  then a hook cautery in the left port.  The dome of the gallbladder was grasped with prograsp, passed and retracted over the dome of the liver. Adhesions between the gallbladder and omentum, duodenum and transverse colon were lysed via hook cautery. The infundibulum was grasped with the fenestrated grasper and retracted toward the right lower quadrant. This maneuver exposed Calot's triangle. The peritoneum overlying the gallbladder infundibulum was then dissected  and the cystic duct and cystic artery identified.  Critical view of safety with the liver bed clearly visible behind the duct and artery with no additional structures noted.  The cystic duct and cystic artery clipped and divided close to the gallbladder.     The gallbladder was then dissected from its peritoneal and liver bed attachments by electrocautery.  Bile spillage all suctioned out.  Hemostasis was checked prior to removing the hook cautery and the Endo Catch bag was then placed through the 12 mm port and the gallbladder was removed.  The gallbladder was passed off the table as a specimen. There was no evidence of bleeding from the gallbladder fossa or cystic artery or leakage of the bile from the cystic duct stump. The 12 mm port site closed with PMI using 0 vicryl under direct vision.  Abdomen desufflated and secondary trocars were removed under direct vision. No bleeding was noted. All skin incisions then closed with subcuticular sutures of 4-0 monocryl and dressed with topical skin adhesive. The orogastric tube was removed and patient extubated.  The patient tolerated the procedure well and was taken to the postanesthesia care unit in stable condition.  All sponge and instrument count correct at end of procedure.

## 2024-03-03 NOTE — Transfer of Care (Signed)
 Immediate Anesthesia Transfer of Care Note  Patient: Chelsea Wheeler  Procedure(s) Performed: CHOLECYSTECTOMY, ROBOT-ASSISTED, LAPAROSCOPIC (Abdomen) INDOCYANINE GREEN  FLUORESCENCE IMAGING (ICG)  Patient Location: PACU  Anesthesia Type:General  Level of Consciousness: drowsy and patient cooperative  Airway & Oxygen Therapy: Patient Spontanous Breathing and Patient connected to face mask oxygen  Post-op Assessment: Report given to RN and Post -op Vital signs reviewed and stable  Post vital signs: Reviewed and stable  Last Vitals:  Vitals Value Taken Time  BP 121/73 03/03/24 09:00  Temp    Pulse 65 03/03/24 09:00  Resp 20 03/03/24 09:00  SpO2 100 % 03/03/24 09:00  Vitals shown include unfiled device data.  Last Pain:  Vitals:   03/03/24 0625  TempSrc: Temporal         Complications: No notable events documented.

## 2024-03-03 NOTE — Interval H&P Note (Signed)
 No change. OK to proceed.

## 2024-03-03 NOTE — Anesthesia Procedure Notes (Signed)
 Procedure Name: Intubation Date/Time: 03/03/2024 7:37 AM  Performed by: Germaine Maeola CROME, CRNAPre-anesthesia Checklist: Patient identified, Emergency Drugs available, Suction available and Patient being monitored Patient Re-evaluated:Patient Re-evaluated prior to induction Oxygen Delivery Method: Circle system utilized Preoxygenation: Pre-oxygenation with 100% oxygen Induction Type: IV induction Ventilation: Mask ventilation without difficulty Laryngoscope Size: McGrath and 3 Grade View: Grade I Tube type: Oral Tube size: 7.0 mm Number of attempts: 1 Airway Equipment and Method: Stylet Placement Confirmation: ETT inserted through vocal cords under direct vision, positive ETCO2 and breath sounds checked- equal and bilateral Secured at: 21 cm Tube secured with: Tape Dental Injury: Teeth and Oropharynx as per pre-operative assessment

## 2024-03-03 NOTE — Anesthesia Postprocedure Evaluation (Signed)
 Anesthesia Post Note  Patient: Chelsea Wheeler  Procedure(s) Performed: CHOLECYSTECTOMY, ROBOT-ASSISTED, LAPAROSCOPIC (Abdomen) INDOCYANINE GREEN  FLUORESCENCE IMAGING (ICG)  Patient location during evaluation: PACU Anesthesia Type: General Level of consciousness: awake and alert Pain management: pain level controlled Vital Signs Assessment: post-procedure vital signs reviewed and stable Respiratory status: spontaneous breathing, nonlabored ventilation and respiratory function stable Cardiovascular status: blood pressure returned to baseline and stable Postop Assessment: no apparent nausea or vomiting Anesthetic complications: no   No notable events documented.   Last Vitals:  Vitals:   03/03/24 1015 03/03/24 1033  BP: 105/69 105/77  Pulse: 66 62  Resp: (!) 22 20  Temp:  (!) 36 C  SpO2: 99% 99%    Last Pain:  Vitals:   03/03/24 1033  TempSrc: Tympanic  PainSc: 4                  Fairy POUR Anina Schnake

## 2024-03-03 NOTE — Discharge Instructions (Signed)
 Laparoscopic Cholecystectomy, Care After This sheet gives you information about how to care for yourself after your procedure. Your doctor may also give you more specific instructions. If you have problems or questions, contact your doctor. Follow these instructions at home: Care for cuts from surgery (incisions)  Follow instructions from your doctor about how to take care of your cuts from surgery. Make sure you: Wash your hands with soap and water before you change your bandage (dressing). If you cannot use soap and water, use hand sanitizer. Change your bandage as told by your doctor. Leave stitches (sutures), skin glue, or skin tape (adhesive) strips in place. They may need to stay in place for 2 weeks or longer. If tape strips get loose and curl up, you may trim the loose edges. Do not remove tape strips completely unless your doctor says it is okay. Do not take baths, swim, or use a hot tub until your doctor says it is okay. OK TO SHOWER 24HRS AFTER YOUR SURGERY.  Check your surgical cut area every day for signs of infection. Check for: More redness, swelling, or pain. More fluid or blood. Warmth. Pus or a bad smell. Activity Do not drive or use heavy machinery while taking prescription pain medicine. Do not play contact sports until your doctor says it is okay. Do not drive for 24 hours if you were given a medicine to help you relax (sedative). Rest as needed. Do not return to work or school until your doctor says it is okay. General instructions  tylenol and advil as needed for discomfort.  Please alternate between the two every four hours as needed for pain.    Use narcotics, if prescribed, only when tylenol and motrin is not enough to control pain.  325-650mg  every 8hrs to max of 3000mg /24hrs (including the 325mg  in every norco dose) for the tylenol.    Advil up to 800mg  per dose every 8hrs as needed for pain.   To prevent or treat constipation while you are taking prescription  pain medicine, your doctor may recommend that you: Drink enough fluid to keep your pee (urine) clear or pale yellow. Take over-the-counter or prescription medicines. Eat foods that are high in fiber, such as fresh fruits and vegetables, whole grains, and beans. Limit foods that are high in fat and processed sugars, such as fried and sweet foods. Contact a doctor if: You develop a rash. You have more redness, swelling, or pain around your surgical cuts. You have more fluid or blood coming from your surgical cuts. Your surgical cuts feel warm to the touch. You have pus or a bad smell coming from your surgical cuts. You have a fever. One or more of your surgical cuts breaks open. You have trouble breathing. You have chest pain. You have pain that is getting worse in your shoulders. You faint or feel dizzy when you stand. You have very bad pain in your belly (abdomen). You are sick to your stomach (nauseous) for more than one day. You have throwing up (vomiting) that lasts for more than one day. You have leg pain. This information is not intended to replace advice given to you by your health care provider. Make sure you discuss any questions you have with your health care provider. Document Released: 03/24/2008 Document Revised: 01/04/2016 Document Reviewed: 12/02/2015 Elsevier Interactive Patient Education  2019 [employer].

## 2024-03-04 ENCOUNTER — Encounter: Payer: Self-pay | Admitting: Surgery

## 2024-03-06 LAB — SURGICAL PATHOLOGY

## 2024-06-26 ENCOUNTER — Other Ambulatory Visit: Payer: Self-pay | Admitting: Neurology

## 2024-06-26 DIAGNOSIS — R519 Headache, unspecified: Secondary | ICD-10-CM

## 2024-06-28 ENCOUNTER — Ambulatory Visit: Admission: RE | Admit: 2024-06-28 | Discharge: 2024-06-28 | Attending: Neurology | Admitting: Neurology

## 2024-06-28 DIAGNOSIS — R519 Headache, unspecified: Secondary | ICD-10-CM | POA: Insufficient documentation
